# Patient Record
Sex: Male | Born: 1975 | Hispanic: Yes | Marital: Single | Smoking: Current every day smoker
Health system: Southern US, Community
[De-identification: ages and names within clinical notes are randomized; demographics above are authoritative.]

## PROBLEM LIST (undated history)

## (undated) DIAGNOSIS — M549 Dorsalgia, unspecified: Secondary | ICD-10-CM

## (undated) DIAGNOSIS — J45909 Unspecified asthma, uncomplicated: Secondary | ICD-10-CM

## (undated) DIAGNOSIS — G8929 Other chronic pain: Secondary | ICD-10-CM

---

## 2012-08-27 ENCOUNTER — Emergency Department: Payer: Self-pay | Admitting: Internal Medicine

## 2014-01-19 ENCOUNTER — Emergency Department: Payer: Self-pay | Admitting: Emergency Medicine

## 2014-01-31 ENCOUNTER — Emergency Department: Payer: Self-pay | Admitting: Internal Medicine

## 2014-02-02 ENCOUNTER — Emergency Department: Payer: Self-pay | Admitting: Emergency Medicine

## 2014-02-07 ENCOUNTER — Emergency Department: Payer: Self-pay | Admitting: Emergency Medicine

## 2014-11-01 ENCOUNTER — Emergency Department: Payer: Self-pay

## 2014-11-01 ENCOUNTER — Emergency Department
Admission: EM | Admit: 2014-11-01 | Discharge: 2014-11-01 | Disposition: A | Discharge status: Home / Self Care | Payer: Self-pay | Attending: Emergency Medicine | Admitting: Emergency Medicine

## 2014-11-01 ENCOUNTER — Encounter: Payer: Self-pay | Admitting: Emergency Medicine

## 2014-11-01 DIAGNOSIS — M5441 Lumbago with sciatica, right side: Secondary | ICD-10-CM | POA: Insufficient documentation

## 2014-11-01 DIAGNOSIS — M5136 Other intervertebral disc degeneration, lumbar region: Secondary | ICD-10-CM | POA: Insufficient documentation

## 2014-11-01 DIAGNOSIS — S39012A Strain of muscle, fascia and tendon of lower back, initial encounter: Secondary | ICD-10-CM | POA: Insufficient documentation

## 2014-11-01 DIAGNOSIS — Y939 Activity, unspecified: Secondary | ICD-10-CM | POA: Insufficient documentation

## 2014-11-01 DIAGNOSIS — Y999 Unspecified external cause status: Secondary | ICD-10-CM | POA: Insufficient documentation

## 2014-11-01 DIAGNOSIS — Z720 Tobacco use: Secondary | ICD-10-CM | POA: Insufficient documentation

## 2014-11-01 DIAGNOSIS — X58XXXA Exposure to other specified factors, initial encounter: Secondary | ICD-10-CM | POA: Insufficient documentation

## 2014-11-01 DIAGNOSIS — Y929 Unspecified place or not applicable: Secondary | ICD-10-CM | POA: Insufficient documentation

## 2014-11-01 HISTORY — DX: Dorsalgia, unspecified: M54.9

## 2014-11-01 HISTORY — DX: Other chronic pain: G89.29

## 2014-11-01 HISTORY — DX: Unspecified asthma, uncomplicated: J45.909

## 2014-11-01 MED ORDER — IBUPROFEN 800 MG PO TABS: 800.0000 mg | ORAL_TABLET | Freq: Three times a day (TID) | ORAL | Status: DC | PRN

## 2014-11-01 MED ORDER — TRAMADOL HCL 50 MG PO TABS: 100.0000 mg | ORAL_TABLET | Freq: Four times a day (QID) | ORAL | Status: DC | PRN

## 2014-11-01 MED ORDER — CYCLOBENZAPRINE HCL 10 MG PO TABS: 10.0000 mg | ORAL_TABLET | Freq: Three times a day (TID) | ORAL | Status: DC | PRN

## 2014-11-01 NOTE — ED Provider Notes (Signed)
[facility] Emergency Department Provider Note  ____________________________________________  Time seen: Approximately 9:08 PM  I have reviewed the triage vital signs and the nursing notes.   HISTORY  Chief Complaint Back Pain and Knee Pain    HPI Charles Benitez is a 39 y.o. male resents with a 3 to four-week history of low back pain. States that he usually takes tramadol with some significant relief. States pain is about a 4 now but to tramadol earlier tonight. Denies any trauma or injury just states he's had numbness and tingling down the legs with burning on the bottom of his feet.   Past Medical History  Diagnosis Date  . Chronic back pain   . Asthma     There are no active problems to display for this patient.   History reviewed. No pertinent past surgical history.  Current Outpatient Rx  Name  Route  Sig  Dispense  Refill  . cyclobenzaprine (FLEXERIL) 10 MG tablet   Oral   Take 1 tablet (10 mg total) by mouth every 8 (eight) hours as needed for muscle spasms.   30 tablet   1   . ibuprofen (ADVIL,MOTRIN) 800 MG tablet   Oral   Take 1 tablet (800 mg total) by mouth every 8 (eight) hours as needed.   30 tablet   0   . traMADol (ULTRAM) 50 MG tablet   Oral   Take 2 tablets (100 mg total) by mouth every 6 (six) hours as needed.   20 tablet   0     Allergies Shrimp  History reviewed. No pertinent family history.  Social History Social History  Substance Use Topics  . Smoking status: Current Every Day Smoker -- 0.50 packs/day    Types: Cigarettes  . Smokeless tobacco: None  . Alcohol Use: Yes    Review of Systems Constitutional: No fever/chills Eyes: No visual changes. ENT: No sore throat. Cardiovascular: Denies chest pain. Respiratory: Denies shortness of breath. Gastrointestinal: No abdominal pain.  No nausea, no vomiting.  No diarrhea.  No constipation. Genitourinary: Negative for dysuria. Musculoskeletal: Positive  for low back pain Skin: Negative for rash. Neurological: Negative for headaches, focal weakness, positive for tingling and burning to lower feet.  10-point ROS otherwise negative.  ____________________________________________   PHYSICAL EXAM:  VITAL SIGNS: ED Triage Vitals  Enc Vitals Group     BP 11/01/14 2036 139/66 mmHg     Pulse Rate 11/01/14 2036 54     Resp 11/01/14 2036 18     Temp 11/01/14 2036 98.5 F (36.9 C)     Temp Source 11/01/14 2036 Oral     SpO2 11/01/14 2036 98 %     Weight 11/01/14 2036 168 lb (76.204 kg)     Height 11/01/14 2036 5\' 6"  (1.676 m)     Head Cir --      Peak Flow --      Pain Score 11/01/14 2036 4     Pain Loc --      Pain Edu? --      Excl. in GC? --     Constitutional: Alert and oriented. Well appearing and in no acute distress. Neck: Full range of motion nontender   Cardiovascular: Normal rate, regular rhythm. Grossly normal heart sounds.  Good peripheral circulation. Respiratory: Normal respiratory effort.  No retractions. Lungs CTAB. Musculoskeletal: No lower extremity tenderness nor edema.  No joint effusions. As of lumbar sacral tenderness. Neurovascularly intact distally. Neurologic:  Normal speech and language. No  gross focal neurologic deficits are appreciated. No gait instability. Skin:  Skin is warm, dry and intact. No rash noted. Psychiatric: Mood and affect are normal. Speech and behavior are normal.  ____________________________________________   LABS (all labs ordered are listed, but only abnormal results are displayed)  Labs Reviewed - No data to display ____________________________________________  RADIOLOGY  Lumbar spine negative per radiologist reviewed by myself.Normal alignment. Mild T12-L1 degenerative disc disease. This is very mildly progressive when compared to prior study.  IMPRESSION: Degenerative changes no acute findings  ____________________________________________   PROCEDURES  Procedure(s)  performed: None  Critical Care performed: No  ____________________________________________   INITIAL IMPRESSION / ASSESSMENT AND PLAN / ED COURSE  Pertinent labs & imaging results that were available during my care of the patient were reviewed by me and considered in my medical decision making (see chart for details).  Chronic low back pain. Rx given for Motrin 800 mg 3 times a day continue tramadol as directed and start Flexeril 5 mg 3 times a day as needed for spasms. ____________________________________________   FINAL CLINICAL IMPRESSION(S) / ED DIAGNOSES  Final diagnoses:  Left-sided low back pain with right-sided sciatica  Lumbar strain, initial encounter  Degenerative disc disease, lumbar      Evangeline Dakin, PA-C 11/01/14 2201  Myrna Blazer, MD 11/02/14 [phone]

## 2014-11-01 NOTE — ED Notes (Signed)
Pt arrived to the ED for complaints of back pain and knee pain. Pt states that he has chronic back pain, takes tramadol for it and it some what works. Pt states that now he is experiencing knee pain and burning on his feet. Pt is AOx4 in no apparent distress.

## 2014-11-01 NOTE — Discharge Instructions (Signed)
Enfermedad Degenerativa del Disco  (Degenerative Disk Disease) La causa de la enfermedad degenerativa del disco son los cambios que se producen en las almohadillas de la columna vertebral (discos intervertebrales) a medida que avanza la edad. Los discos de la columna vertebral son discos blandos y compresibles localizados entre los huesos de la columna (vrtebras). Ellos actan como amortiguadores. La enfermedad degenerativa de disco puede afectar a toda la columna vertebral. Sin embargo, el cuello y la espalda baja son los ms afectados. Con el envejecimiento pueden ocurrir [employer] discos espinales, tales como:   Los discos de la columna vertebral pueden secarse y encogerse.  Puede haber pequeos desgarros en la membrana resistente que cubre el disco (anillo fibroso).  El espacio del disco puede volverse ms pequeo debido a la prdida de [place].  Puede ocurrir que haya crecimientos anormales en el hueso (espolones). Estos pueden presionar las races nerviosas que salen del canal espinal y Programmer, multimedia.  El canal espinal se Doctor, hospital. CAUSAS  La enfermedad degenerativa de disco es un trastorno causado por los cambios que ocurren en los discos espinales con el envejecimiento. No se conoce la causa exacta, pero no hay una base gentica en muchos pacientes. Los cambios degenerativos pueden ocurrir debido a la prdida de lquido en el disco. Esto hace que el disco sea ms delgado y reduce el espacio entre los huesos de la columna vertebral. Pueden aparecer pequeas grietas en la capa exterior del disco. Esto puede producir la ruptura del disco. Hay ms probabilidades de sufrir una enfermedad degenerativa del disco si tiene sobrepeso. Fumar cigarrillos y Education officer, environmental trabajos pesados, como el levantamiento de pesas, tambin puede aumentar el riesgo de sufrir esta enfermedad. Los cambios degenerativos pueden comenzar despus de una lesin repentina. El crecimiento de espolones seos puede  comprimir las races nerviosas y Programmer, multimedia.  SNTOMAS  Pueden variar de Neomia Dear persona a otra. Algunas personas pueden no sentir Scientist, research (medical), mientras que otras sienten dolor intenso. El dolor puede ser tan intenso que puede limitar sus [place]. La localizacin del dolor depende de la parte de la columna vertebral afectada. Sentir dolor en el cuello o en el brazo si hay un disco afectado en la zona del cuello. Sentir Radiographer, therapeutic espalda, las nalgas o las piernas si hay un disco afectado en la cintura. El dolor se agrava al doblarse, levantarse o con los movimientos de torsin. El dolor puede comenzar gradualmente y despus empeorar con el [place]. Tambin puede comenzar despus de una lesin mayor o menor. Puede sentir adormecimiento u hormigueo en los brazos o en las piernas.  DIAGNSTICO  El Barrister's clerk acerca de sus sntomas y de las actividades o hbitos que le causan Chief Technology Officer. Tambin podr preguntar acerca de lesiones, enfermedades o tratamientos anteriores. El mdico lo examinar para comprobar el rango de movimientos en la zona afectada, la fuerza en las extremidades y la sensibilidad en las reas de los brazos y las piernas a las que llegan las diferentes races nerviosas. Puede tomarle una radiografa de la columna vertebral. El mdico puede sugerir otras pruebas de diagnstico por imgenes, como resonancia magntica (MRI), si es necesario.  TRATAMIENTO  El tratamiento consiste en hacer reposo, modificar las actividades y la aplicacin de hielo y Company secretary. El mdico podr recetar medicamentos para Primary school teacher y pedirle que haga algunos ejercicios para fortalecer la espalda. En algunos casos podra necesitar [place]. Usted y el mdico decidirn qu tratamiento es el mejor para  usted.  INSTRUCCIONES PARA EL CUIDADO EN EL HOGAR   Siga las tcnicas apropiadas para levantar objetos y Advertising account planner, segn le indique su mdico.  Mantenga una buena [place].  Haga ejercicio con  regularidad segn lo aconsejado.  Haga ejercicios de relajacin.  Cambie sus hbitos para sentarse, estar de pie y los hbitos de sueo segn las indicaciones. Cambie de posicin con frecuencia.  Baje de peso segn lo aconsejado.  Si fuma, abandone el hbito.  Use calzado de apoyo. SOLICITE ATENCIN MDICA SI:  El dolor no desaparece en 1 a 4 semanas.  SOLICITE ATENCIN MDICA DE INMEDIATO SI:   El dolor es intenso.  Siente debilidad [employer], en las manos o en las piernas.  Comienza a perder el control de la vejiga o los movimientos intestinales. ASEGRESE DE QUE:   Comprende estas instrucciones.  Controlar su enfermedad.  Solicitar ayuda de inmediato si no mejora o si empeora. Document Released: 06/07/2008 Document Revised: 05/14/2011 [facility] Patient Information 2015 [place], [place]. This information is not intended to replace advice given to you by your health care provider. Make sure you discuss any questions you have with your health care provider.

## 2015-10-18 IMAGING — CR DG TIBIA/FIBULA 2V*L*
1 series · 2 of 2 positions shown · non-contrast
Comparison: None.

CLINICAL DATA: Laceration to the anterior lower leg by a piece of
[place]. Cellulitis.

EXAM:
LEFT TIBIA AND FIBULA - 2 VIEW

[Series 1: ap · 0.17mm/px · 2 of 2 slices shown]
[im 1/2]
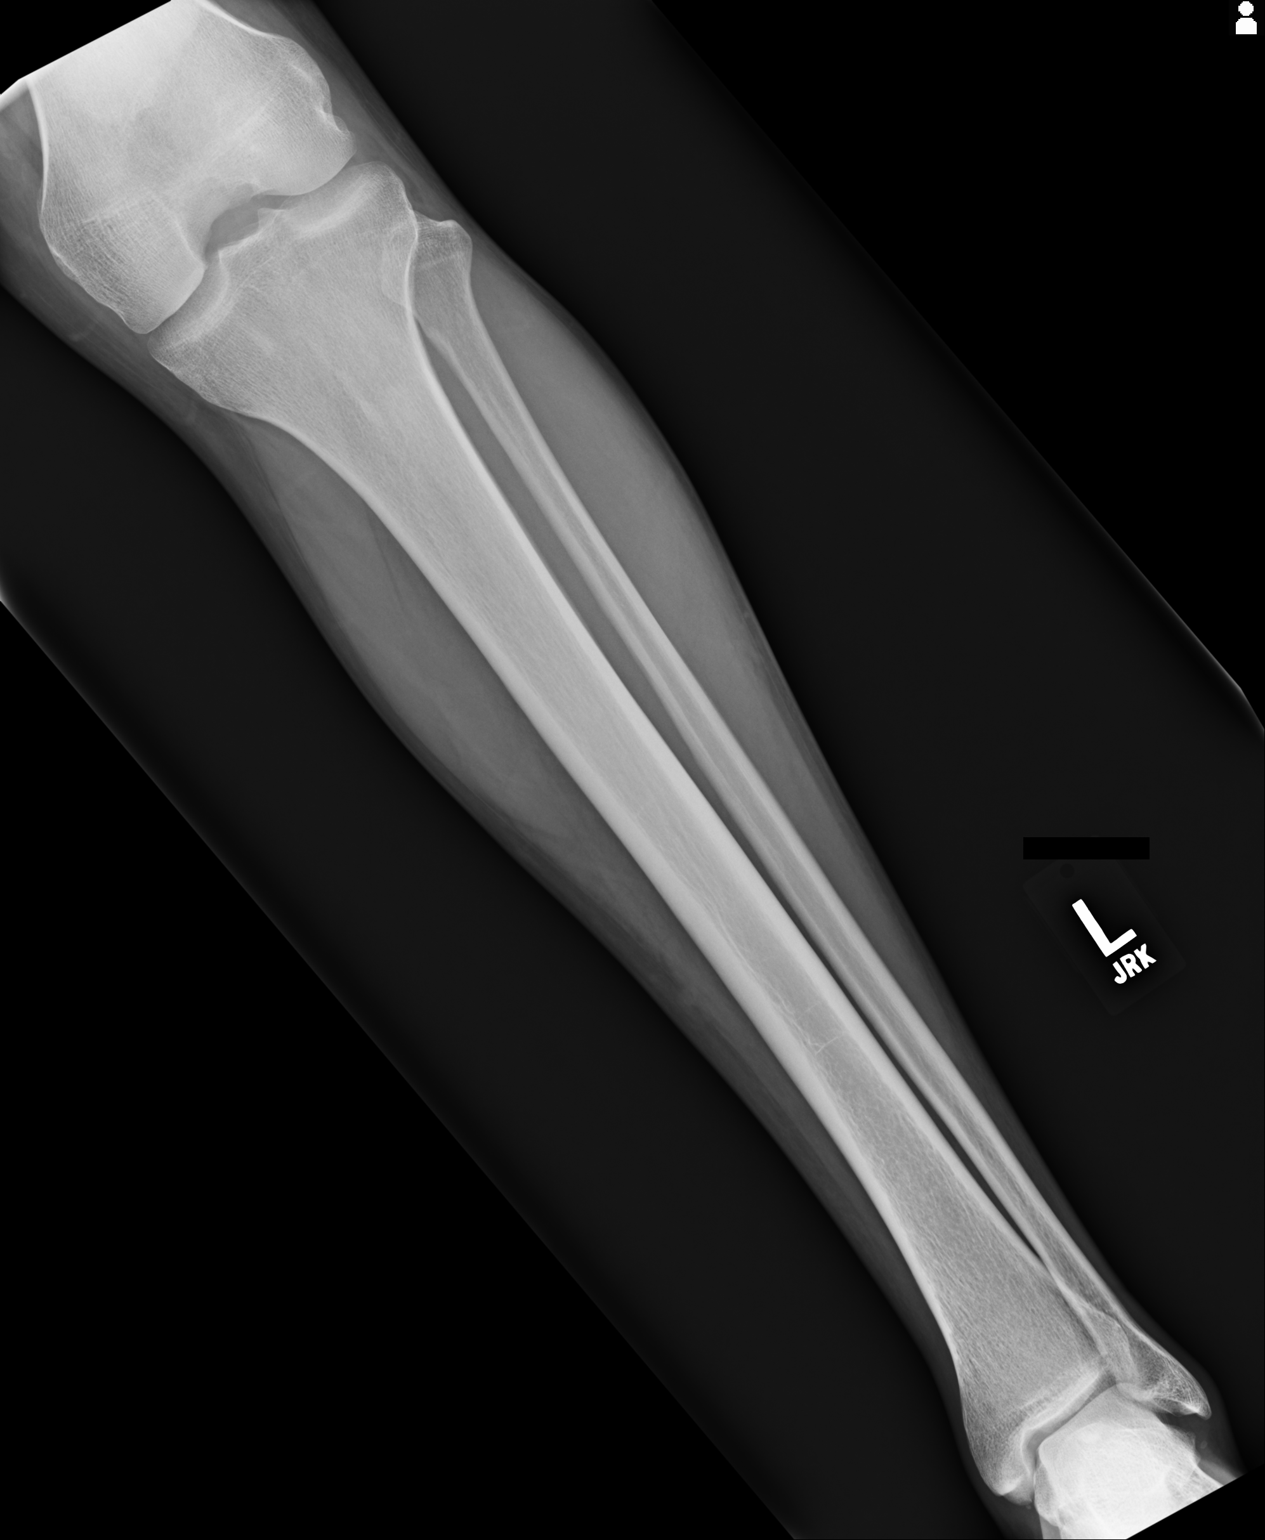
[im 2/2]
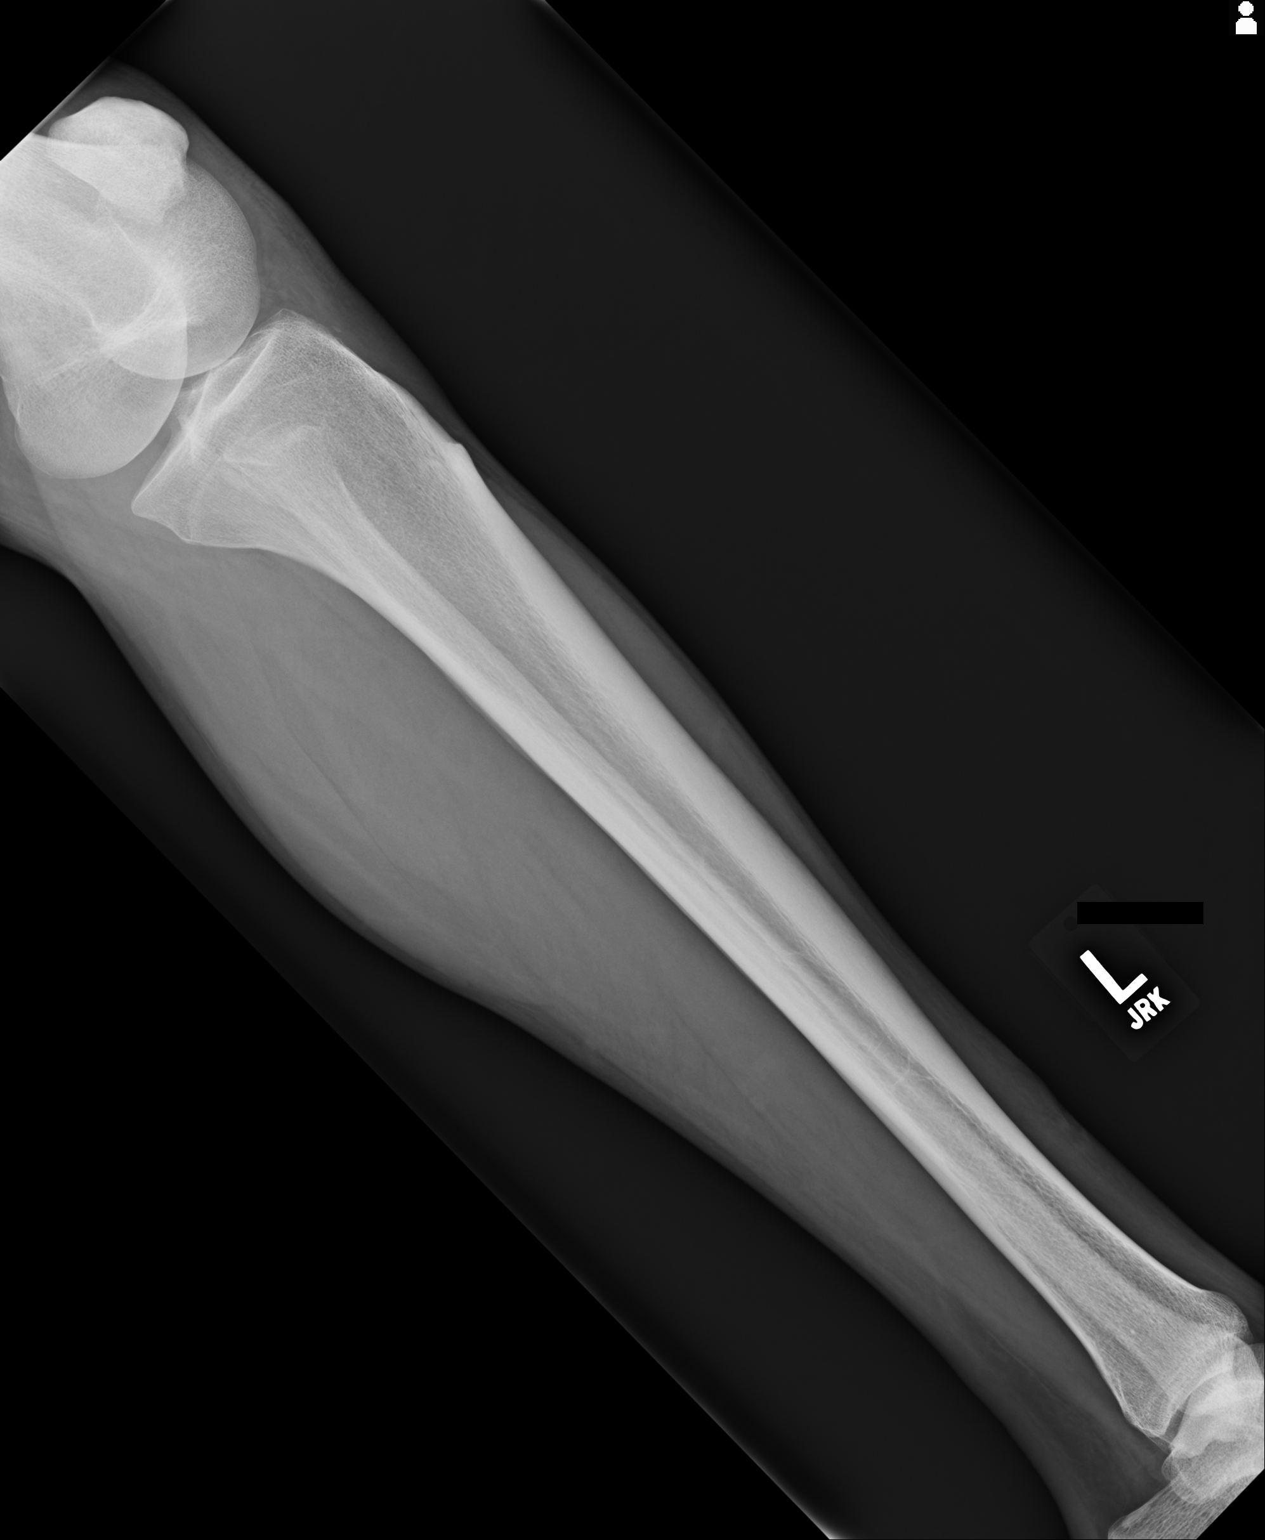

[2 of 2 positions shown; findings below may reference images not displayed]

FINDINGS: There is no fracture or other osseous abnormality. No radiodense
foreign body in the soft tissues. Slight soft tissue swelling at the
site of the injury.
IMPRESSION: Soft tissue swelling.  Otherwise, normal.

## 2015-10-26 ENCOUNTER — Emergency Department
Admission: EM | Admit: 2015-10-26 | Discharge: 2015-10-26 | Disposition: A | Discharge status: Home / Self Care | Payer: Self-pay | Attending: Emergency Medicine | Admitting: Emergency Medicine

## 2015-10-26 ENCOUNTER — Encounter: Payer: Self-pay | Admitting: Medical Oncology

## 2015-10-26 ENCOUNTER — Emergency Department: Payer: Self-pay

## 2015-10-26 DIAGNOSIS — X58XXXA Exposure to other specified factors, initial encounter: Secondary | ICD-10-CM | POA: Insufficient documentation

## 2015-10-26 DIAGNOSIS — Y999 Unspecified external cause status: Secondary | ICD-10-CM | POA: Insufficient documentation

## 2015-10-26 DIAGNOSIS — Y929 Unspecified place or not applicable: Secondary | ICD-10-CM | POA: Insufficient documentation

## 2015-10-26 DIAGNOSIS — M5136 Other intervertebral disc degeneration, lumbar region: Secondary | ICD-10-CM | POA: Insufficient documentation

## 2015-10-26 DIAGNOSIS — S29011A Strain of muscle and tendon of front wall of thorax, initial encounter: Secondary | ICD-10-CM | POA: Insufficient documentation

## 2015-10-26 DIAGNOSIS — S39012A Strain of muscle, fascia and tendon of lower back, initial encounter: Secondary | ICD-10-CM | POA: Insufficient documentation

## 2015-10-26 DIAGNOSIS — Y939 Activity, unspecified: Secondary | ICD-10-CM | POA: Insufficient documentation

## 2015-10-26 DIAGNOSIS — F17210 Nicotine dependence, cigarettes, uncomplicated: Secondary | ICD-10-CM | POA: Insufficient documentation

## 2015-10-26 DIAGNOSIS — S29019A Strain of muscle and tendon of unspecified wall of thorax, initial encounter: Secondary | ICD-10-CM

## 2015-10-26 DIAGNOSIS — J45909 Unspecified asthma, uncomplicated: Secondary | ICD-10-CM | POA: Insufficient documentation

## 2015-10-26 MED ORDER — METHOCARBAMOL 750 MG PO TABS: 750.0000 mg | ORAL_TABLET | Freq: Four times a day (QID) | ORAL | 0 refills | Status: DC

## 2015-10-26 MED ORDER — NAPROXEN 500 MG PO TABS: 500.0000 mg | ORAL_TABLET | Freq: Two times a day (BID) | ORAL | 0 refills | Status: DC

## 2015-10-26 NOTE — ED Triage Notes (Signed)
Pt reports that he has been having lower back pain and neck pain since Friday without injury.

## 2015-10-26 NOTE — ED Notes (Signed)
Pt in via triage with reports of lower back pain and bilateral shoulder pain since Friday.  Pt reports he has degenerative disc disease but the shoulder pain is new.  Pt reports taking some of his sisters tramadol and flexeril today but w/ no relief.  Pt ambulatory to room, A/Ox4, no immediate distress at this time.

## 2015-10-26 NOTE — ED Provider Notes (Signed)
[facility] Emergency Department Provider Note  ____________________________________________  Time seen: Approximately 12:27 PM  I have reviewed the triage vital signs and the nursing notes.   HISTORY  Chief Complaint Back Pain and Neck Pain    HPI Charles Benitez is a 40 y.o. male presents for evaluation of low back and neck pain, between the scapula, with no known injury 3 days. Patient reports he took his sister's tramadol and Flexeril with no relief. Denies any trauma.   Past Medical History:  Diagnosis Date  . Asthma   . Chronic back pain     There are no active problems to display for this patient.   History reviewed. No pertinent surgical history.  Prior to Admission medications   Medication Sig Start Date End Date Taking? Authorizing Provider  methocarbamol (ROBAXIN) 750 MG tablet Take 1 tablet (750 mg total) by mouth 4 (four) times daily. 10/26/15   Evangeline Dakinharles M Chaz Mcglasson, PA-C  naproxen (NAPROSYN) 500 MG tablet Take 1 tablet (500 mg total) by mouth 2 (two) times daily with a meal. 10/26/15   Evangeline Dakinharles M Trenna Kiely, PA-C    Allergies Shrimp [shellfish allergy]  No family history on file.  Social History Social History  Substance Use Topics  . Smoking status: Current Every Day Smoker    Packs/day: 0.50    Types: Cigarettes  . Smokeless tobacco: Not on file  . Alcohol use Yes    Review of Systems Constitutional: No fever/chills Cardiovascular: Denies chest pain. Respiratory: Denies shortness of breath. Genitourinary: Negative for dysuria. Musculoskeletal: Positive for low back and neck pain. As of her musculoskeletal pain.. Skin: Negative for rash. Neurological: Negative for headaches, focal weakness or numbness.  10-point ROS otherwise negative.  ____________________________________________   PHYSICAL EXAM:  VITAL SIGNS: ED Triage Vitals [10/26/15 1205]  Enc Vitals Group     BP (!) 141/95     Pulse Rate 95     Resp 17     Temp  98 F (36.7 C)     Temp Source Oral     SpO2 96 %     Weight 165 lb (74.8 kg)     Height 5\' 4"  (1.626 m)     Head Circumference      Peak Flow      Pain Score 7     Pain Loc      Pain Edu?      Excl. in GC?     Constitutional: Alert and oriented. Well appearing and in no acute distress. Neck: No stridor.Supple, full range of motion nontender.   Cardiovascular: Normal rate, regular rhythm. Grossly normal heart sounds.  Good peripheral circulation. Respiratory: Normal respiratory effort.  No retractions. Lungs CTAB. Gastrointestinal: Soft and nontender. No distention. No abdominal bruits. No CVA tenderness. Musculoskeletal: No lower extremity tenderness nor edema.  No joint effusions. Straight leg raise positive bilaterally for range of motion. Point tenderness noted between the scapulas within the muscular area. Neurologic:  Normal speech and language. No gross focal neurologic deficits are appreciated. No gait instability. Skin:  Skin is warm, dry and intact. No rash noted. Psychiatric: Mood and affect are normal. Speech and behavior are normal.  ____________________________________________   LABS (all labs ordered are listed, but only abnormal results are displayed)  Labs Reviewed - No data to display ____________________________________________  EKG   ____________________________________________  RADIOLOGY  No acute osseous findings. Degenerative disc disease noted. ____________________________________________   PROCEDURES  Procedure(s) performed: None  Critical Care performed: No  ____________________________________________  INITIAL IMPRESSION / ASSESSMENT AND PLAN / ED COURSE  Pertinent labs & imaging results that were available during my care of the patient were reviewed by me and considered in my medical decision making (see chart for details). Review of the [place] CSRS was performed in accordance of the [facility] prior to dispensing any controlled  drugs.  Nonspecific musculoskeletal pain with degenerative disc disease. Rx given for Robaxin 750 4 times a day and Naprosyn 500 mg twice a day. Patient follow-up with PCP or return to ER with worsening symptomology.  Clinical Course    ____________________________________________   FINAL CLINICAL IMPRESSION(S) / ED DIAGNOSES  Final diagnoses:  Degenerative disc disease, lumbar  Thoracic myofascial strain, initial encounter  Lumbar strain, initial encounter     This chart was dictated using voice recognition software/Dragon. Despite best efforts to proofread, errors can occur which can change the meaning. Any change was purely unintentional.    Evangeline Dakinharles M Huntington Leverich, PA-C 10/26/15 1401    Emily FilbertJonathan E Williams, MD 10/26/15 [phone]

## 2016-04-03 ENCOUNTER — Emergency Department
Admission: EM | Admit: 2016-04-03 | Discharge: 2016-04-03 | Disposition: A | Discharge status: Home / Self Care | Payer: No Typology Code available for payment source | Attending: Emergency Medicine | Admitting: Emergency Medicine

## 2016-04-03 ENCOUNTER — Encounter: Payer: Self-pay | Admitting: Emergency Medicine

## 2016-04-03 DIAGNOSIS — M545 Low back pain: Secondary | ICD-10-CM | POA: Diagnosis present

## 2016-04-03 DIAGNOSIS — F17210 Nicotine dependence, cigarettes, uncomplicated: Secondary | ICD-10-CM | POA: Insufficient documentation

## 2016-04-03 DIAGNOSIS — G8929 Other chronic pain: Secondary | ICD-10-CM | POA: Insufficient documentation

## 2016-04-03 DIAGNOSIS — J45909 Unspecified asthma, uncomplicated: Secondary | ICD-10-CM | POA: Diagnosis not present

## 2016-04-03 MED ORDER — NAPROXEN 500 MG PO TABS: 500.0000 mg | ORAL_TABLET | Freq: Two times a day (BID) | ORAL | 0 refills | Status: DC

## 2016-04-03 NOTE — Discharge Instructions (Signed)
You need to follow up with a doctor listed on this paper for your chronic back pain. [place] [facility] [place] and you need to find a doctor. There are several clinics listed on your discharge papers that are available to you. Begin taking naproxen 500 mg twice a day with food. You may also use ice or heat to your back as needed for comfort.

## 2016-04-03 NOTE — ED Triage Notes (Signed)
Patient presents to the ED with exacerbation of chronic back pain.  Patient states pain has been severe x 1 month.  Patient reports being diagnosed with degenerative disc disease and herniated discs in the ED previously.  Patient states, "I'm hoping you can give me a shot today."  Patient denies recent known injury but does report that he does heavy lifting at work.

## 2016-04-03 NOTE — ED Notes (Signed)
Called in the waiting room with no answer. °

## 2016-04-03 NOTE — ED Notes (Signed)
Pt reports lower back pain for one month + - pt states he has degenerative disc in lumbar spine - pt denies fall or injury

## 2016-04-03 NOTE — ED Provider Notes (Signed)
[facility] Emergency Department Provider Note  ____________________________________________   First MD Initiated Contact with Patient 04/03/16 1356     (approximate)  I have reviewed the triage vital signs and the nursing notes.   HISTORY  Chief Complaint Back Pain   HPI Charles Benitez is a 41 y.o. male is here with complaint of continued back pain.Patient states that he has chronic back pain and states that for the last month his pain has exacerbated. He states there is been no recent injury or any overuse that would make his back pain worse. He denies any bowel or bladder problems. He denies any history of kidney stones. He denies any nausea, vomiting or diarrhea. He states he was diagnosed in the emergency room with "degenerative disc disease and herniated disc". He states he has not been taking any over-the-counter medication and did not follow-up with any doctors as he believed that he did not have any active insurance. He states that he does lifting at work but does not report anything abnormal that this happened recently. He denies any incontinence of bowel or bladder. He continues to walk without assistance. Currently he rates his pain as 7/10.   Past Medical History:  Diagnosis Date  . Asthma   . Chronic back pain     There are no active problems to display for this patient.   History reviewed. No pertinent surgical history.  Prior to Admission medications   Medication Sig Start Date End Date Taking? Authorizing Provider  naproxen (NAPROSYN) 500 MG tablet Take 1 tablet (500 mg total) by mouth 2 (two) times daily with a meal. 04/03/16   Tommi Rumpshonda L Summers, PA-C    Allergies Shrimp [shellfish allergy]  No family history on file.  Social History Social History  Substance Use Topics  . Smoking status: Current Every Day Smoker    Packs/day: 0.50    Types: Cigarettes  . Smokeless tobacco: Never Used  . Alcohol use Yes    Review of  Systems Constitutional: No fever/chills Cardiovascular: Denies chest pain. Respiratory: Denies shortness of breath. Gastrointestinal: No abdominal pain.  No nausea, no vomiting.  Genitourinary: Negative for dysuria. Musculoskeletal: Positive for chronic back pain. Skin: Negative for rash. Neurological: Negative for headaches, focal weakness or numbness.  10-point ROS otherwise negative.  ____________________________________________   PHYSICAL EXAM:  VITAL SIGNS: ED Triage Vitals  Enc Vitals Group     BP 04/03/16 1231 (!) 115/58     Pulse Rate 04/03/16 1231 66     Resp 04/03/16 1231 18     Temp 04/03/16 1231 98.2 F (36.8 C)     Temp Source 04/03/16 1231 Oral     SpO2 04/03/16 1231 98 %     Weight 04/03/16 1232 162 lb (73.5 kg)     Height 04/03/16 1232 5\' 5"  (1.651 m)     Head Circumference --      Peak Flow --      Pain Score 04/03/16 1232 7     Pain Loc --      Pain Edu? --      Excl. in GC? --     Constitutional: Alert and oriented. Well appearing and in no acute distress. Eyes: Conjunctivae are normal. PERRL. EOMI. Head: Atraumatic. Nose: No congestion/rhinnorhea. Neck: No stridor.   Cardiovascular: Normal rate, regular rhythm. Grossly normal heart sounds.  Good peripheral circulation. Respiratory: Normal respiratory effort.  No retractions. Lungs CTAB. Gastrointestinal: Soft and nontender. No distention.  Musculoskeletal: Examination of the  back there is no gross deformity. There is tenderness on palpation of the lumbar spine and paravertebral muscles. No active muscle spasms were seen. Straight leg raises were negative. Reflexes were equal bilaterally. Good muscle strength. He did. Neurologic:  Normal speech and language. No gross focal neurologic deficits are appreciated. No gait instability. Skin:  Skin is warm, dry and intact. No rash noted. Psychiatric: Mood and affect are normal. Speech and behavior are  normal.  ____________________________________________   LABS (all labs ordered are listed, but only abnormal results are displayed)  Labs Reviewed - No data to display   RADIOLOGY  Right cervical and lumbar spine x-rays were reviewed. ____________________________________________   PROCEDURES  Procedure(s) performed: None  Procedures  Critical Care performed: No  ____________________________________________   INITIAL IMPRESSION / ASSESSMENT AND PLAN / ED COURSE  Pertinent labs & imaging results that were available during my care of the patient were reviewed by me and considered in my medical decision making (see chart for details).  Registration verified the patient's [place], health choice is active. Patient was given the orthopedist on call Dr. Rosita Kea to follow-up with. He was also given a prescription for naproxen 500 mg twice a day with food. Patient also was given a list of medical clinics to follow up with and establish as a PCP.   ____________________________________________   FINAL CLINICAL IMPRESSION(S) / ED DIAGNOSES  Final diagnoses:  Chronic midline low back pain without sciatica      NEW MEDICATIONS STARTED DURING THIS VISIT:  Discharge Medication List as of 04/03/2016  3:32 PM       Note:  This document was prepared using Dragon voice recognition software and may include unintentional dictation errors.    Tommi Rumps, PA-C 04/03/16 1759    Jene Every, MD 04/09/16 [phone]

## 2016-12-27 ENCOUNTER — Encounter: Payer: Self-pay | Admitting: Emergency Medicine

## 2016-12-27 ENCOUNTER — Emergency Department
Admission: EM | Admit: 2016-12-27 | Discharge: 2016-12-27 | Disposition: A | Discharge status: Home / Self Care | Payer: Self-pay | Attending: Emergency Medicine | Admitting: Emergency Medicine

## 2016-12-27 DIAGNOSIS — M545 Low back pain, unspecified: Secondary | ICD-10-CM

## 2016-12-27 DIAGNOSIS — J45909 Unspecified asthma, uncomplicated: Secondary | ICD-10-CM | POA: Insufficient documentation

## 2016-12-27 DIAGNOSIS — F17210 Nicotine dependence, cigarettes, uncomplicated: Secondary | ICD-10-CM | POA: Insufficient documentation

## 2016-12-27 MED ORDER — NAPROXEN 500 MG PO TABS: 500.0000 mg | ORAL_TABLET | Freq: Two times a day (BID) | ORAL | 0 refills | Status: DC

## 2016-12-27 MED ORDER — METHOCARBAMOL 500 MG PO TABS
1000.0000 mg | ORAL_TABLET | Freq: Once | ORAL | Status: AC
  Administered 2016-12-27: 1000 mg via ORAL
  Filled 2016-12-27: qty 2

## 2016-12-27 MED ORDER — METHOCARBAMOL 500 MG PO TABS: ORAL_TABLET | ORAL | 0 refills | Status: DC

## 2016-12-27 MED ORDER — HYDROCODONE-ACETAMINOPHEN 5-325 MG PO TABS: 1.0000 | ORAL_TABLET | Freq: Four times a day (QID) | ORAL | 0 refills | Status: DC | PRN

## 2016-12-27 MED ORDER — HYDROCODONE-ACETAMINOPHEN 5-325 MG PO TABS
1.0000 | ORAL_TABLET | Freq: Once | ORAL | Status: AC
  Administered 2016-12-27: 1 via ORAL
  Filled 2016-12-27: qty 1

## 2016-12-27 MED ORDER — KETOROLAC TROMETHAMINE 30 MG/ML IJ SOLN
30.0000 mg | Freq: Once | INTRAMUSCULAR | Status: AC
  Administered 2016-12-27: 30 mg via INTRAMUSCULAR
  Filled 2016-12-27: qty 1

## 2016-12-27 NOTE — ED Provider Notes (Signed)
[facility] Emergency Department Provider Note   ____________________________________________   First MD Initiated Contact with Patient 12/27/16 1248     (approximate)  I have reviewed the triage vital signs and the nursing notes.   HISTORY  Chief Complaint Back Pain  HPI Charles Benitez is a 41 y.o. male chief complaint of low back pain for the last 2 days. Patient states he has a history of back painhas been seen in the ED in the past. He has not taken any over-the-counter medication other than Aleve in the last 2 days which has not helped. He denies any recent injury to his back. He denies any paresthesias, saddle anesthesias or incontinence of bowel or bladder. Patient continues to ambulate without assistance.currently he rates his pain as a 10 over 10.   Past Medical History:  Diagnosis Date  . Asthma   . Chronic back pain     There are no active problems to display for this patient.   History reviewed. No pertinent surgical history.  Prior to Admission medications   Medication Sig Start Date End Date Taking? Authorizing Provider  HYDROcodone-acetaminophen (NORCO/VICODIN) 5-325 MG tablet Take 1 tablet by mouth every 6 (six) hours as needed for moderate pain. 12/27/16   Tommi Rumps, PA-C  methocarbamol (ROBAXIN) 500 MG tablet 1-2 tablets every 6 hours prn muscle spasms 12/27/16   Bridget Hartshorn L, PA-C  naproxen (NAPROSYN) 500 MG tablet Take 1 tablet (500 mg total) by mouth 2 (two) times daily with a meal. 12/27/16   Tommi Rumps, PA-C    Allergies Shrimp [shellfish allergy]  No family history on file.  Social History Social History  Substance Use Topics  . Smoking status: Current Every Day Smoker    Packs/day: 0.30    Types: Cigarettes  . Smokeless tobacco: Never Used  . Alcohol use Yes    Review of Systems Constitutional: No fever/chills Cardiovascular: Denies chest pain. Respiratory: Denies shortness of  breath. Gastrointestinal: No abdominal pain.  No nausea, no vomiting.   Genitourinary: Negative for dysuria. Musculoskeletal: positive for low back pain. Skin: Negative for rash. Neurological: Negative for headaches, focal weakness or numbness. ____________________________________________   PHYSICAL EXAM:  VITAL SIGNS: ED Triage Vitals  Enc Vitals Group     BP 12/27/16 1205 128/64     Pulse Rate 12/27/16 1205 70     Resp 12/27/16 1205 18     Temp 12/27/16 1205 98.6 F (37 C)     Temp Source 12/27/16 1205 Oral     SpO2 12/27/16 1205 97 %     Weight 12/27/16 1206 165 lb (74.8 kg)     Height 12/27/16 1206 5\' 5"  (1.651 m)     Head Circumference --      Peak Flow --      Pain Score 12/27/16 1204 10     Pain Loc --      Pain Edu? --      Excl. in GC? --    Constitutional: Alert and oriented. Well appearing and in no acute distress. Eyes: Conjunctivae are normal.  Head: Atraumatic. Nose: No congestion/rhinnorhea. Neck: No stridor.   Cardiovascular: Normal rate, regular rhythm. Grossly normal heart sounds.  Good peripheral circulation. Respiratory: Normal respiratory effort.  No retractions. Lungs CTAB. Gastrointestinal: Soft and nontender. No distention.  No CVA tenderness. Musculoskeletal: examination of the back there is no gross deformity there is however moderate tenderness on palpation bilateral lumbar spine and paravertebral muscles. Range of motion is  restricted secondary to muscle spasms. Unable to evaluate straight leg raises secondary to muscle spasms. Neurologic:  Normal speech and language. No gross focal neurologic deficits are appreciated. Reflexes 1+ bilaterally. Skin:  Skin is warm, dry and intact. No rash, erythema, ecchymosis or abrasions seen. Psychiatric: Mood and affect are normal. Speech and behavior are normal.  ____________________________________________   LABS (all labs ordered are listed, but only abnormal results are displayed)  Labs Reviewed - No  data to display  RADIOLOGY  Deferred. ____________________________________________   PROCEDURES  Procedure(s) performed: None  Procedures  Critical Care performed: No  ____________________________________________   INITIAL IMPRESSION / ASSESSMENT AND PLAN / ED COURSE  As part of my medical decision making, I reviewed the following data within the electronic MEDICAL RECORD NUMBER Notes from prior ED visits and [place] Controlled Substance Database  patient improved after being given Toradol 30 g IM, Norco and Robaxin. Patient was discharged when girlfriend came to pick him up. He was discharged with a prescription for naproxen 500 mg twice a day with food, Robaxin one or 2 tablets every 6 hours as needed for muscle spasms and Norco one every 6 hours as needed for pain #8. Patient is encouraged to use ice or heat to his back as needed for back pain. He is also to follow-up with a PCP or one of many clinics that were listed on his discharge papers.   ___________________________________________   FINAL CLINICAL IMPRESSION(S) / ED DIAGNOSES  Final diagnoses:  Acute bilateral low back pain without sciatica      NEW MEDICATIONS STARTED DURING THIS VISIT:  New Prescriptions   HYDROCODONE-ACETAMINOPHEN (NORCO/VICODIN) 5-325 MG TABLET    Take 1 tablet by mouth every 6 (six) hours as needed for moderate pain.   METHOCARBAMOL (ROBAXIN) 500 MG TABLET    1-2 tablets every 6 hours prn muscle spasms   NAPROXEN (NAPROSYN) 500 MG TABLET    Take 1 tablet (500 mg total) by mouth 2 (two) times daily with a meal.     Note:  This document was prepared using Dragon voice recognition software and may include unintentional dictation errors.    Tommi RumpsSummers, Rhonda L, PA-C 12/27/16 1432    Jeanmarie PlantMcShane, James A, MD 12/27/16 [phone]

## 2016-12-27 NOTE — Discharge Instructions (Signed)
[facility] any continued problems with your [facility]. He should establish a primary care provider for further evaluation also. You may call [facility], [facility], [facility], [facility] and [facility] which are listed on your discharge papers.

## 2016-12-27 NOTE — ED Notes (Signed)
NAD noted at time of D/C. Pt denies questions or concerns. Pt ambulatory to the lobby at this time.  

## 2016-12-27 NOTE — ED Triage Notes (Signed)
Patient presents to the ED with severe lower back pain x 2 days.  Patient reports history of chronic back pain.  Patient denies injury.  Patient states, "this has happened before, I don't have to do anything to my back, it will just start to hurt really bad."

## 2016-12-27 NOTE — ED Notes (Addendum)
Reports lower back pain started three days ago from unknown cause. Patient awakened by pain. Patient reports he has had this pain for years

## 2017-06-05 ENCOUNTER — Encounter: Payer: Self-pay | Admitting: Emergency Medicine

## 2017-06-05 ENCOUNTER — Emergency Department
Admission: EM | Admit: 2017-06-05 | Discharge: 2017-06-05 | Disposition: A | Discharge status: Home / Self Care | Payer: Self-pay | Attending: Emergency Medicine | Admitting: Emergency Medicine

## 2017-06-05 ENCOUNTER — Other Ambulatory Visit: Payer: Self-pay

## 2017-06-05 DIAGNOSIS — R112 Nausea with vomiting, unspecified: Secondary | ICD-10-CM

## 2017-06-05 DIAGNOSIS — J45909 Unspecified asthma, uncomplicated: Secondary | ICD-10-CM | POA: Insufficient documentation

## 2017-06-05 DIAGNOSIS — R197 Diarrhea, unspecified: Secondary | ICD-10-CM

## 2017-06-05 DIAGNOSIS — F17210 Nicotine dependence, cigarettes, uncomplicated: Secondary | ICD-10-CM | POA: Insufficient documentation

## 2017-06-05 DIAGNOSIS — E860 Dehydration: Secondary | ICD-10-CM | POA: Insufficient documentation

## 2017-06-05 LAB — LIPASE, BLOOD: LIPASE: 24 U/L (ref 11–51)

## 2017-06-05 LAB — CBC WITH DIFFERENTIAL/PLATELET
Basophils Absolute: 0 10*3/uL (ref 0–0.1)
Basophils Relative: 0 %
EOS PCT: 3 %
Eosinophils Absolute: 0.2 10*3/uL (ref 0–0.7)
HCT: 47 % (ref 40.0–52.0)
Hemoglobin: 15.6 g/dL (ref 13.0–18.0)
LYMPHS ABS: 1 10*3/uL (ref 1.0–3.6)
Lymphocytes Relative: 17 %
MCH: 31 pg (ref 26.0–34.0)
MCHC: 33.3 g/dL (ref 32.0–36.0)
MCV: 93.2 fL (ref 80.0–100.0)
MONO ABS: 0.7 10*3/uL (ref 0.2–1.0)
Monocytes Relative: 11 %
Neutro Abs: 4.2 10*3/uL (ref 1.4–6.5)
Neutrophils Relative %: 69 %
PLATELETS: 256 10*3/uL (ref 150–440)
RBC: 5.05 MIL/uL (ref 4.40–5.90)
RDW: 13.8 % (ref 11.5–14.5)
WBC: 6.1 10*3/uL (ref 3.8–10.6)

## 2017-06-05 LAB — COMPREHENSIVE METABOLIC PANEL
ALK PHOS: 101 U/L (ref 38–126)
ALT: 31 U/L (ref 17–63)
AST: 27 U/L (ref 15–41)
Albumin: 3.9 g/dL (ref 3.5–5.0)
Anion gap: 4 — ABNORMAL LOW (ref 5–15)
BUN: 11 mg/dL (ref 6–20)
CALCIUM: 8.9 mg/dL (ref 8.9–10.3)
CHLORIDE: 108 mmol/L (ref 101–111)
CO2: 27 mmol/L (ref 22–32)
CREATININE: 0.78 mg/dL (ref 0.61–1.24)
GFR calc Af Amer: >60 mL/min (ref >60)
Glucose, Bld: 94 mg/dL (ref 65–99)
Potassium: 4.1 mmol/L (ref 3.5–5.1)
Sodium: 139 mmol/L (ref 135–145)
Total Bilirubin: 0.4 mg/dL (ref 0.3–1.2)
Total Protein: 7.3 g/dL (ref 6.5–8.1)

## 2017-06-05 MED ORDER — SODIUM CHLORIDE 0.9 % IV BOLUS
1000.0000 mL | Freq: Once | INTRAVENOUS | Status: AC
  Administered 2017-06-05: 1000 mL via INTRAVENOUS

## 2017-06-05 MED ORDER — ONDANSETRON HCL 4 MG PO TABS: 4.0000 mg | ORAL_TABLET | Freq: Every day | ORAL | 0 refills | Status: AC | PRN

## 2017-06-05 MED ORDER — ONDANSETRON HCL 4 MG/2ML IJ SOLN
4.0000 mg | Freq: Once | INTRAMUSCULAR | Status: AC
  Administered 2017-06-05: 4 mg via INTRAVENOUS
  Filled 2017-06-05: qty 2

## 2017-06-05 NOTE — Discharge Instructions (Signed)
Please make sure you remain well-hydrated and follow-up with primary care for reevaluation.  Return to the emergency department sooner for any concerns.  It was a pleasure to take care of you today, and thank you for coming to our emergency department.  If you have any questions or concerns before leaving please ask the nurse to grab me and I'm more than happy to go through your aftercare instructions again.  If you were prescribed any opioid pain medication today such as Norco, Vicodin, Percocet, morphine, hydrocodone, or oxycodone please make sure you do not drive when you are taking this medication as it can alter your ability to drive safely.  If you have any concerns once you are home that you are not improving or are in fact getting worse before you can make it to your follow-up appointment, please do not hesitate to call 911 and come back for further evaluation.  Merrily BrittleNeil Alyssah Algeo, MD  Results for orders placed or performed during the hospital encounter of 06/05/17  Comprehensive metabolic panel  Result Value Ref Range   Sodium 139 135 - 145 mmol/L   Potassium 4.1 3.5 - 5.1 mmol/L   Chloride 108 101 - 111 mmol/L   CO2 27 22 - 32 mmol/L   Glucose, Bld 94 65 - 99 mg/dL   BUN 11 6 - 20 mg/dL   Creatinine, Ser 1.610.78 0.61 - 1.24 mg/dL   Calcium 8.9 8.9 - 09.610.3 mg/dL   Total Protein 7.3 6.5 - 8.1 g/dL   Albumin 3.9 3.5 - 5.0 g/dL   AST 27 15 - 41 U/L   ALT 31 17 - 63 U/L   Alkaline Phosphatase 101 38 - 126 U/L   Total Bilirubin 0.4 0.3 - 1.2 mg/dL   GFR calc non Af Amer >60 >60 mL/min   GFR calc Af Amer >60 >60 mL/min   Anion gap 4 (L) 5 - 15  Lipase, blood  Result Value Ref Range   Lipase 24 11 - 51 U/L  CBC with Differential  Result Value Ref Range   WBC 6.1 3.8 - 10.6 K/uL   RBC 5.05 4.40 - 5.90 MIL/uL   Hemoglobin 15.6 13.0 - 18.0 g/dL   HCT 04.547.0 40.940.0 - 81.152.0 %   MCV 93.2 80.0 - 100.0 fL   MCH 31.0 26.0 - 34.0 pg   MCHC 33.3 32.0 - 36.0 g/dL   RDW 91.413.8 78.211.5 - 95.614.5 %   Platelets 256 150 - 440 K/uL   Neutrophils Relative % 69 %   Neutro Abs 4.2 1.4 - 6.5 K/uL   Lymphocytes Relative 17 %   Lymphs Abs 1.0 1.0 - 3.6 K/uL   Monocytes Relative 11 %   Monocytes Absolute 0.7 0.2 - 1.0 K/uL   Eosinophils Relative 3 %   Eosinophils Absolute 0.2 0 - 0.7 K/uL   Basophils Relative 0 %   Basophils Absolute 0.0 0 - 0.1 K/uL

## 2017-06-05 NOTE — ED Triage Notes (Signed)
C/O abdominal pain, diarrhea and vomiting x 1 day.

## 2017-06-05 NOTE — ED Provider Notes (Signed)
[facility] Emergency Department Provider Note  ____________________________________________   First MD Initiated Contact with Patient 06/05/17 [phone]     (approximate)  I have reviewed the triage vital signs and the nursing notes.   HISTORY  Chief Complaint Abdominal Pain; Diarrhea; and Emesis   HPI Charles Benitez is a 42 y.o. male who self presents the emergency department with cramping abdominal pain nausea vomiting and diarrhea for the past day.  His symptoms began shortly after eating dinner at a [employer] restaurant.  His abdominal pain is diffuse abdominal mild to moderate severity cramping lower abdominal worse with defecating improved thereafter.  He feels like his symptoms are slowly improving.  He denies fevers or chills.  He has no history of abdominal surgeries.  No sick contacts.  No fevers or chills.  He is able to eat and drink.  Past Medical History:  Diagnosis Date  . Asthma   . Chronic back pain     There are no active problems to display for this patient.   History reviewed. No pertinent surgical history.  Prior to Admission medications   Medication Sig Start Date End Date Taking? Authorizing Provider  HYDROcodone-acetaminophen (NORCO/VICODIN) 5-325 MG tablet Take 1 tablet by mouth every 6 (six) hours as needed for moderate pain. Patient not taking: Reported on 06/05/2017 12/27/16   Tommi RumpsSummers, Rhonda L, PA-C  methocarbamol (ROBAXIN) 500 MG tablet 1-2 tablets every 6 hours prn muscle spasms Patient not taking: Reported on 06/05/2017 12/27/16   Tommi RumpsSummers, Rhonda L, PA-C  naproxen (NAPROSYN) 500 MG tablet Take 1 tablet (500 mg total) by mouth 2 (two) times daily with a meal. Patient not taking: Reported on 06/05/2017 12/27/16   Tommi RumpsSummers, Rhonda L, PA-C  ondansetron (ZOFRAN) 4 MG tablet Take 1 tablet (4 mg total) by mouth daily as needed. 06/05/17 06/05/18  Merrily Brittleifenbark, Sutter Ahlgren, MD    Allergies Shrimp [shellfish allergy]  No family history on  file.  Social History Social History   Tobacco Use  . Smoking status: Current Every Day Smoker    Packs/day: 0.30    Types: Cigarettes  . Smokeless tobacco: Never Used  Substance Use Topics  . Alcohol use: Yes  . Drug use: Yes    Types: Marijuana    Review of Systems Constitutional: No fever/chills Eyes: No visual changes. ENT: No sore throat. Cardiovascular: Denies chest pain. Respiratory: Denies shortness of breath. Gastrointestinal: Positive for abdominal pain.  Positive for nausea, positive for vomiting.  Positive for diarrhea.  No constipation. Genitourinary: Negative for dysuria. Musculoskeletal: Negative for back pain. Skin: Negative for rash. Neurological: Negative for headaches, focal weakness or numbness.   ____________________________________________   PHYSICAL EXAM:  VITAL SIGNS: ED Triage Vitals  Enc Vitals Group     BP 06/05/17 0925 115/71     Pulse Rate 06/05/17 0925 84     Resp 06/05/17 0925 16     Temp 06/05/17 0925 97.6 F (36.4 C)     Temp Source 06/05/17 0925 Oral     SpO2 06/05/17 0925 100 %     Weight 06/05/17 0924 178 lb (80.7 kg)     Height 06/05/17 0924 5\' 6"  (1.676 m)     Head Circumference --      Peak Flow --      Pain Score 06/05/17 0924 7     Pain Loc --      Pain Edu? --      Excl. in GC? --     Constitutional: Alert and  oriented x4 joking laughing well-appearing nontoxic no diaphoresis speaks full clear sentences Nose: No congestion/rhinnorhea. Mouth/Throat: No trismus Neck: No stridor.   Cardiovascular: Normal rate, regular rhythm. Grossly normal heart sounds.  Good peripheral circulation. Respiratory: Normal respiratory effort.  No retractions. Lungs CTAB and moving good air Gastrointestinal: Soft mild diffuse upper tenderness with no rebound or guarding no peritonitis no focality Musculoskeletal: No lower extremity edema   Neurologic:  Normal speech and language. No gross focal neurologic deficits are appreciated. Skin:   Skin is warm, dry and intact. No rash noted. Psychiatric: Mood and affect are normal. Speech and behavior are normal.    ____________________________________________   DIFFERENTIAL includes but not limited to  Food poisoning, bacterial gastroenteritis, viral gastroenteritis, appendicitis, pancreatitis ____________________________________________   LABS (all labs ordered are listed, but only abnormal results are displayed)  Labs Reviewed  COMPREHENSIVE METABOLIC PANEL - Abnormal; Notable for the following components:      Result Value   Anion gap 4 (*)    All other components within normal limits  LIPASE, BLOOD  CBC WITH DIFFERENTIAL/PLATELET    Lab work reviewed by me with no acute disease __________________________________________  EKG   ____________________________________________  RADIOLOGY   ____________________________________________   PROCEDURES  Procedure(s) performed: no  Procedures  Critical Care performed: no  Observation: no ____________________________________________   INITIAL IMPRESSION / ASSESSMENT AND PLAN / ED COURSE  Pertinent labs & imaging results that were available during my care of the patient were reviewed by me and considered in my medical decision making (see chart for details).  The patient arrives hemodynamically stable and relatively well-appearing with rapidly improving nausea vomiting diarrhea after eating at a restaurant.  Lab work is pending particularly concerning for pancreatitis and dehydration.    ----------------------------------------- 12:18 PM on 06/05/2017 -----------------------------------------  The patient symptoms are improved.  His abdomen is benign and he is able to eat and drink.  Will discharge home with a short course of Zofran and strict return precautions.  The patient verbalizes understanding and agreement the plan.  ____________________________________________   FINAL CLINICAL IMPRESSION(S) /  ED DIAGNOSES  Final diagnoses:  Nausea vomiting and diarrhea  Dehydration      NEW MEDICATIONS STARTED DURING THIS VISIT:  Discharge Medication List as of 06/05/2017 12:17 PM    START taking these medications   Details  ondansetron (ZOFRAN) 4 MG tablet Take 1 tablet (4 mg total) by mouth daily as needed., Starting Wed 06/05/2017, Until Thu 06/05/2018, Print         Note:  This document was prepared using Dragon voice recognition software and may include unintentional dictation errors.     Merrily Brittle, MD 06/07/17 [phone]

## 2017-07-12 IMAGING — CR DG CERVICAL SPINE 2 OR 3 VIEWS
1 series · 3 of 3 positions shown · non-contrast
Comparison: None.

CLINICAL DATA: Neck pain radiating into both shoulders.

EXAM:
CERVICAL SPINE - 2-3 VIEW

[Series 1: dg cervical spine 2 or 3 views · 0.14mm/px · 3 of 3 slices shown]
[im 1/3]
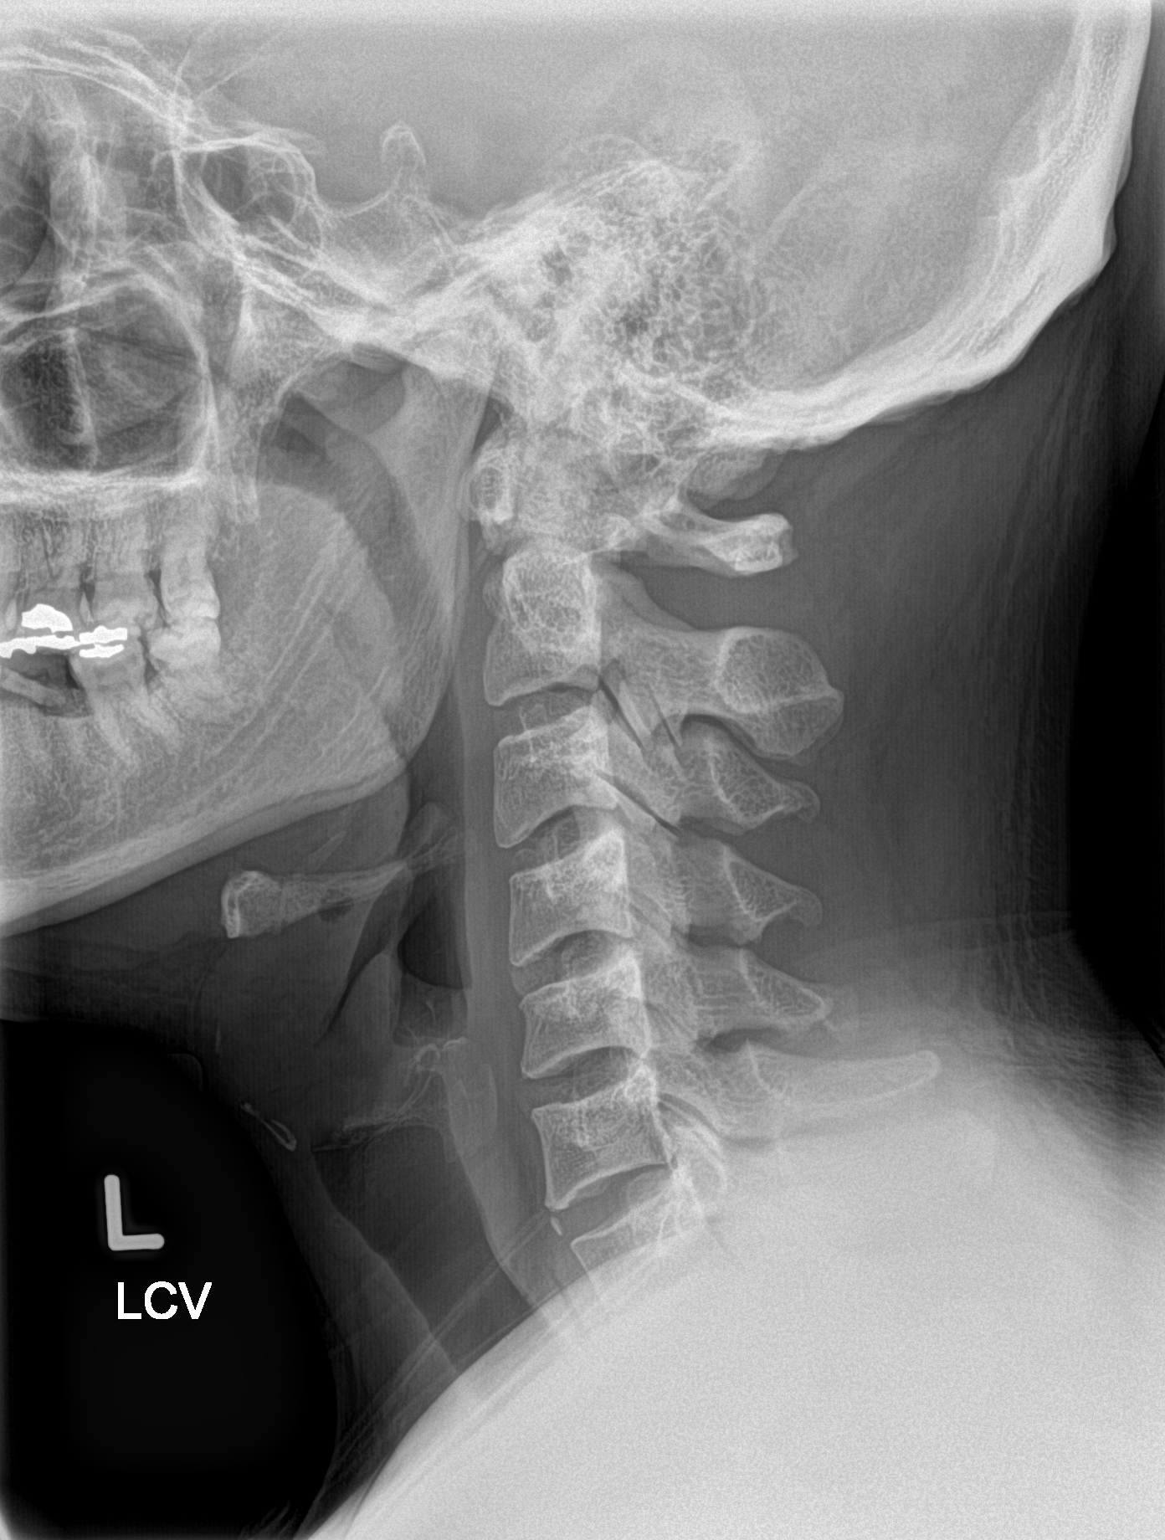
[im 2/3]
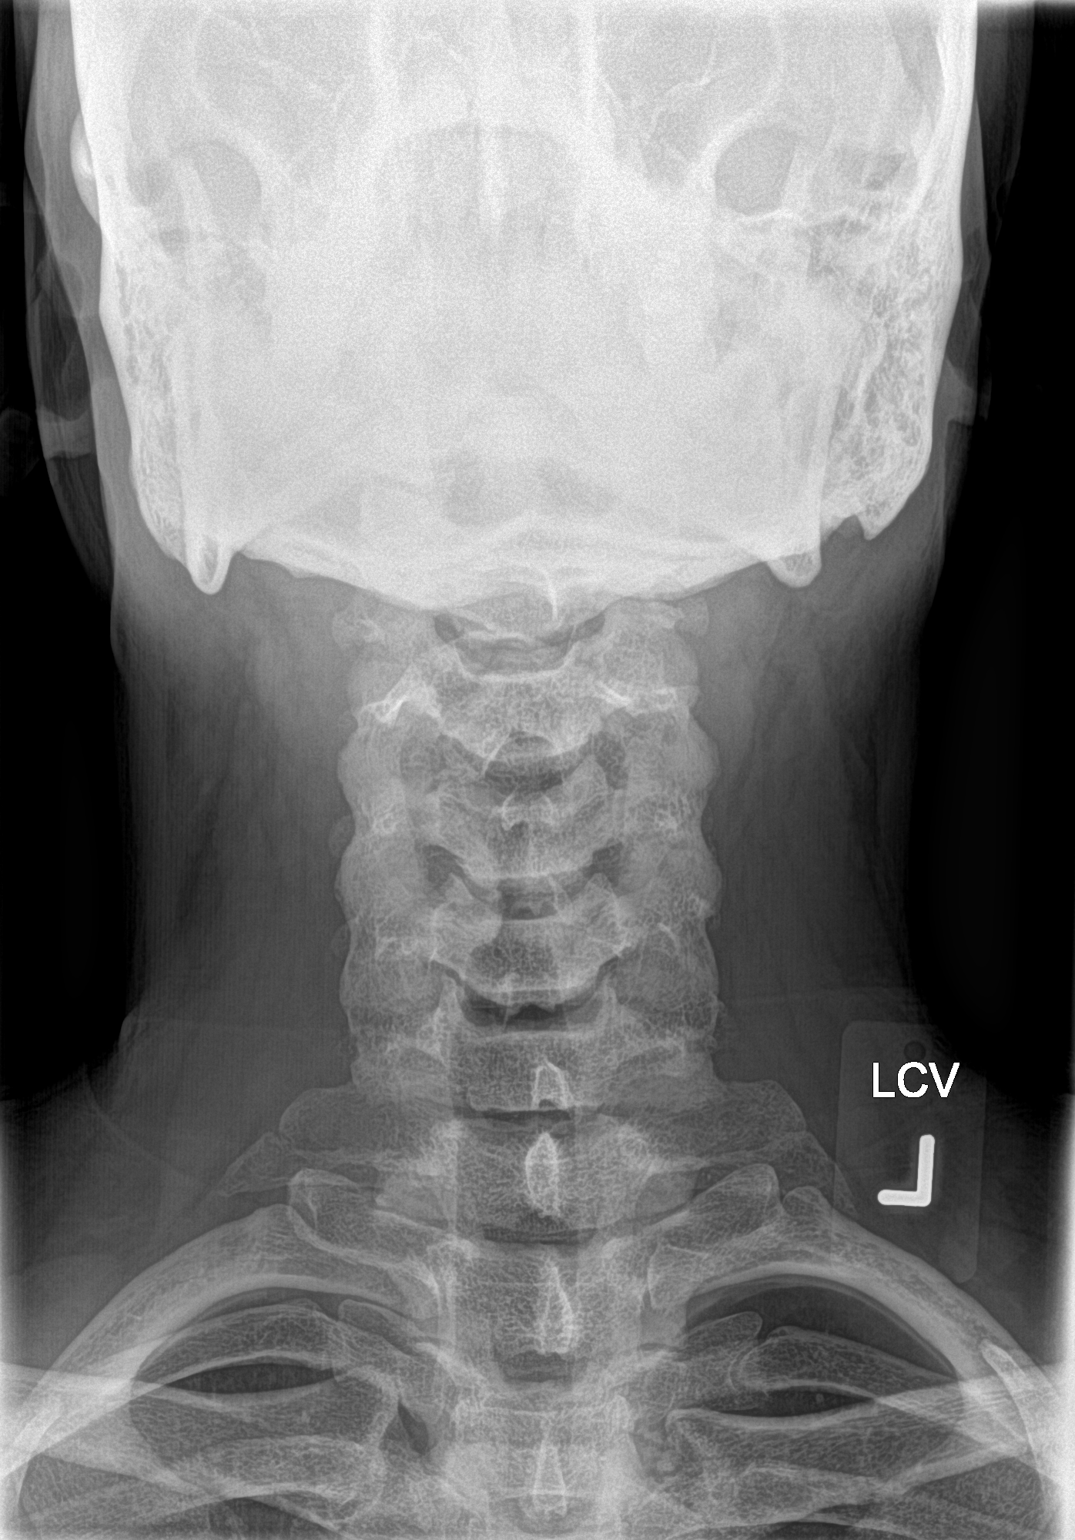
[im 3/3]
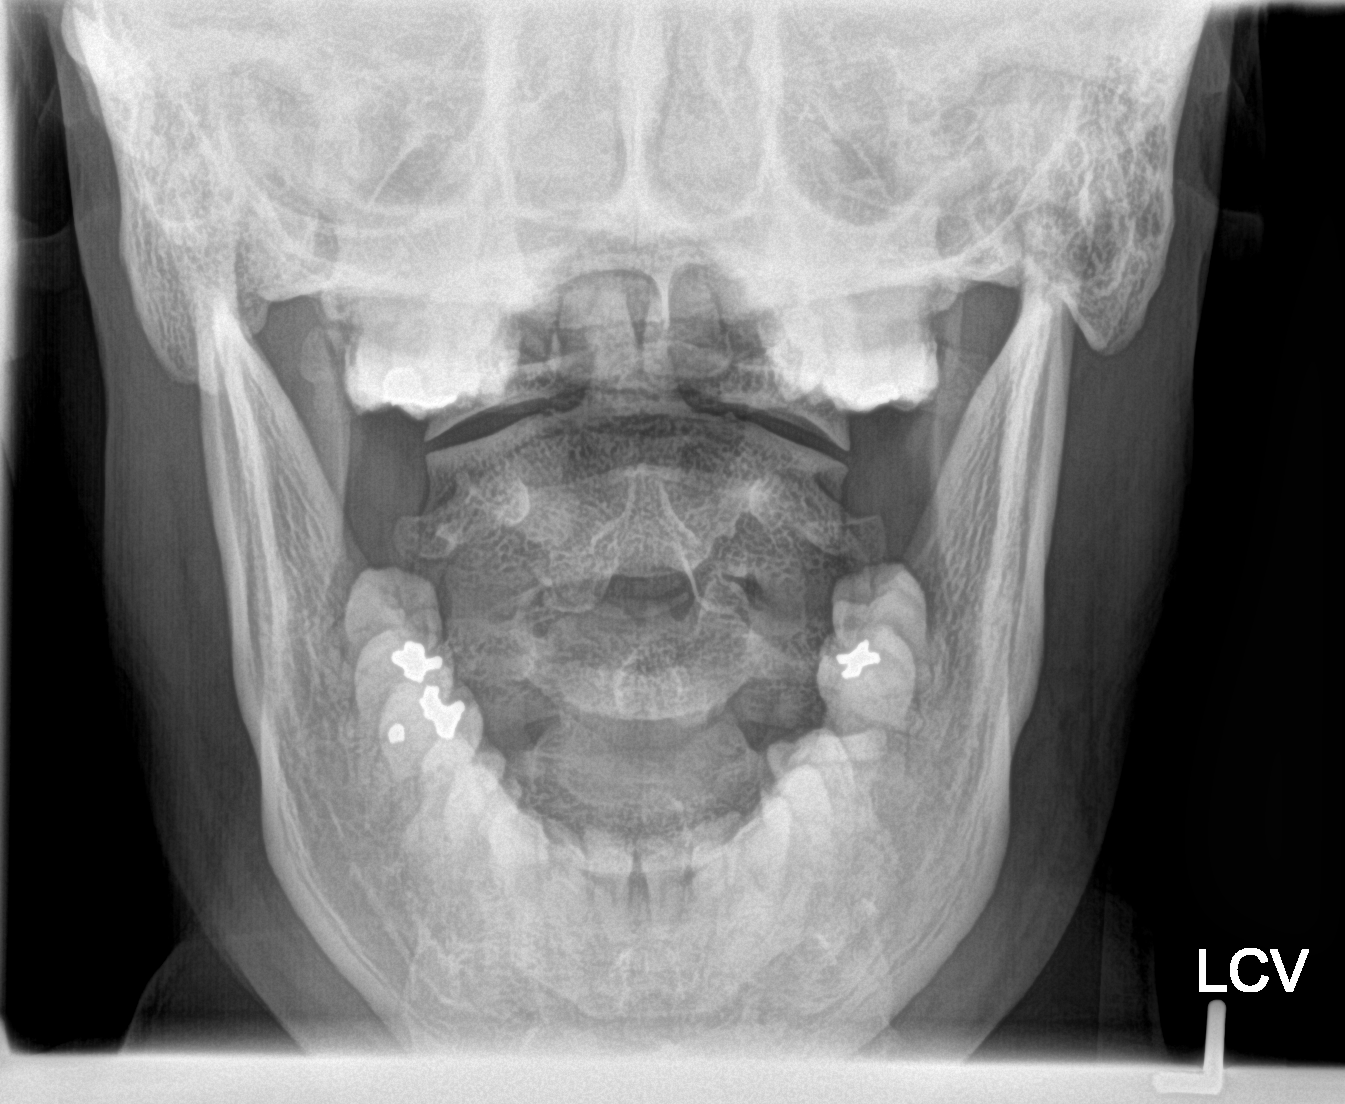

[3 of 3 positions shown; findings below may reference images not displayed]

FINDINGS: There is no evidence of cervical spine fracture or prevertebral soft
tissue swelling. Alignment is normal. No other significant bone
abnormalities are identified.
IMPRESSION: Negative cervical spine radiographs.

## 2018-04-08 ENCOUNTER — Emergency Department
Admission: EM | Admit: 2018-04-08 | Discharge: 2018-04-08 | Disposition: A | Discharge status: Home / Self Care | Payer: Medicaid Other | Attending: Emergency Medicine | Admitting: Emergency Medicine

## 2018-04-08 ENCOUNTER — Encounter: Payer: Self-pay | Admitting: Emergency Medicine

## 2018-04-08 DIAGNOSIS — M5442 Lumbago with sciatica, left side: Secondary | ICD-10-CM | POA: Diagnosis not present

## 2018-04-08 DIAGNOSIS — M545 Low back pain: Secondary | ICD-10-CM | POA: Diagnosis present

## 2018-04-08 DIAGNOSIS — M5441 Lumbago with sciatica, right side: Secondary | ICD-10-CM | POA: Insufficient documentation

## 2018-04-08 DIAGNOSIS — F17210 Nicotine dependence, cigarettes, uncomplicated: Secondary | ICD-10-CM | POA: Insufficient documentation

## 2018-04-08 DIAGNOSIS — J45909 Unspecified asthma, uncomplicated: Secondary | ICD-10-CM | POA: Insufficient documentation

## 2018-04-08 MED ORDER — CYCLOBENZAPRINE HCL 5 MG PO TABS: ORAL_TABLET | ORAL | 0 refills | Status: DC

## 2018-04-08 MED ORDER — KETOROLAC TROMETHAMINE 30 MG/ML IJ SOLN
30.0000 mg | Freq: Once | INTRAMUSCULAR | Status: AC
  Administered 2018-04-08: 30 mg via INTRAMUSCULAR
  Filled 2018-04-08: qty 1

## 2018-04-08 MED ORDER — IBUPROFEN 600 MG PO TABS: 600.0000 mg | ORAL_TABLET | Freq: Four times a day (QID) | ORAL | 0 refills | Status: DC | PRN

## 2018-04-08 MED ORDER — TRAMADOL HCL 50 MG PO TABS
50.0000 mg | ORAL_TABLET | Freq: Once | ORAL | Status: AC
  Administered 2018-04-08: 50 mg via ORAL
  Filled 2018-04-08: qty 1

## 2018-04-08 MED ORDER — ORPHENADRINE CITRATE 30 MG/ML IJ SOLN
60.0000 mg | Freq: Two times a day (BID) | INTRAMUSCULAR | Status: DC
  Administered 2018-04-08: 60 mg via INTRAMUSCULAR
  Filled 2018-04-08: qty 2

## 2018-04-08 MED ORDER — TRAMADOL HCL 50 MG PO TABS: 50.0000 mg | ORAL_TABLET | Freq: Four times a day (QID) | ORAL | 0 refills | Status: DC | PRN

## 2018-04-08 NOTE — ED Provider Notes (Signed)
[facility] Emergency Department Provider Note  ____________________________________________  Time seen: Approximately 10:26 AM  I have reviewed the triage vital signs and the nursing notes.   HISTORY  Chief Complaint Back Pain    HPI Charles Benitez is a 43 y.o. male that presents to the emergency department for worsening back pain for 2 days. Pain is primarily mid to low back. Pain occasionally radiates into both legs. Patient has chronic back pain that flares occasionally. No bowel or bladder dysfunction or saddle anesthesia. No vomiting, weakness, numbness, tingling.   Past Medical History:  Diagnosis Date  . Asthma   . Chronic back pain     There are no active problems to display for this patient.   History reviewed. No pertinent surgical history.  Prior to Admission medications   Medication Sig Start Date End Date Taking? Authorizing Provider  cyclobenzaprine (FLEXERIL) 5 MG tablet Take 1-2 tablets 3 times daily as needed 04/08/18   Enid Derry, PA-C  HYDROcodone-acetaminophen (NORCO/VICODIN) 5-325 MG tablet Take 1 tablet by mouth every 6 (six) hours as needed for moderate pain. Patient not taking: Reported on 06/05/2017 12/27/16   Tommi Rumps, PA-C  ibuprofen (ADVIL,MOTRIN) 600 MG tablet Take 1 tablet (600 mg total) by mouth every 6 (six) hours as needed. 04/08/18   Enid Derry, PA-C  methocarbamol (ROBAXIN) 500 MG tablet 1-2 tablets every 6 hours prn muscle spasms Patient not taking: Reported on 06/05/2017 12/27/16   Tommi Rumps, PA-C  naproxen (NAPROSYN) 500 MG tablet Take 1 tablet (500 mg total) by mouth 2 (two) times daily with a meal. Patient not taking: Reported on 06/05/2017 12/27/16   Tommi Rumps, PA-C  ondansetron (ZOFRAN) 4 MG tablet Take 1 tablet (4 mg total) by mouth daily as needed. 06/05/17 06/05/18  Merrily Brittle, MD  traMADol (ULTRAM) 50 MG tablet Take 1 tablet (50 mg total) by mouth every 6 (six) hours as needed.  04/08/18 04/08/19  Enid Derry, PA-C    Allergies Shrimp [shellfish allergy]  No family history on file.  Social History Social History   Tobacco Use  . Smoking status: Current Every Day Smoker    Packs/day: 0.30    Types: Cigarettes  . Smokeless tobacco: Never Used  Substance Use Topics  . Alcohol use: Yes  . Drug use: Yes    Types: Marijuana     Review of Systems  Constitutional: No fever/chills Respiratory: No SOB. Gastrointestinal: No abdominal pain.  No nausea, no vomiting.  Musculoskeletal: Positive for back pain.  Skin: Negative for rash, abrasions, lacerations, ecchymosis. Neurological: Negative for headaches, numbness or tingling   ____________________________________________   PHYSICAL EXAM:  VITAL SIGNS: ED Triage Vitals  Enc Vitals Group     BP 04/08/18 0844 (!) 112/46     Pulse Rate 04/08/18 0844 73     Resp 04/08/18 0844 20     Temp 04/08/18 0844 98.3 F (36.8 C)     Temp Source 04/08/18 0844 Oral     SpO2 04/08/18 0844 97 %     Weight 04/08/18 0845 168 lb (76.2 kg)     Height 04/08/18 0845 5\' 5"  (1.651 m)     Head Circumference --      Peak Flow --      Pain Score 04/08/18 0845 8     Pain Loc --      Pain Edu? --      Excl. in GC? --      Constitutional: Alert and oriented.  Well appearing and in no acute distress. Eyes: Conjunctivae are normal. PERRL. EOMI. Head: Atraumatic. ENT:      Ears:      Nose: No congestion/rhinnorhea.      Mouth/Throat: Mucous membranes are moist.  Neck: No stridor.   Cardiovascular: Normal rate, regular rhythm.  Good peripheral circulation. Respiratory: Normal respiratory effort without tachypnea or retractions. Lungs CTAB. Good air entry to the bases with no decreased or absent breath sounds. Gastrointestinal: Bowel sounds 4 quadrants. Soft and nontender to palpation. No guarding or rigidity. No palpable masses. No distention.  Musculoskeletal: Full range of motion to all extremities. No gross deformities  appreciated. Diffuse tenderness to palpation of lumbar spine and lumbar paraspinal muscles. Strength equal in upper and lower extremities bilaterally. Full ROM of bilateral hips. Normal gait. No foot drop.  Neurologic:  Normal speech and language. No gross focal neurologic deficits are appreciated.  Skin:  Skin is warm, dry and intact. No rash noted. Psychiatric: Mood and affect are normal. Speech and behavior are normal. Patient exhibits appropriate insight and judgement.   ____________________________________________   LABS (all labs ordered are listed, but only abnormal results are displayed)  Labs Reviewed - No data to display ____________________________________________  EKG   ____________________________________________  RADIOLOGY   No results found.  ____________________________________________    PROCEDURES  Procedure(s) performed:    Procedures    Medications  orphenadrine (NORFLEX) injection 60 mg (60 mg Intramuscular Given 04/08/18 1116)  traMADol (ULTRAM) tablet 50 mg (50 mg Oral Given 04/08/18 1112)  ketorolac (TORADOL) 30 MG/ML injection 30 mg (30 mg Intramuscular Given 04/08/18 1113)     ____________________________________________   INITIAL IMPRESSION / ASSESSMENT AND PLAN / ED COURSE  Pertinent labs & imaging results that were available during my care of the patient were reviewed by me and considered in my medical decision making (see chart for details).  Review of the [place] CSRS was performed in accordance of the [facility] prior to dispensing any controlled drugs.   Patient presented to emergency department for evaluation of low back pain.  Vital signs and exam are reassuring.  Back pain is similar as previous.  Patient was given IM Toradol, IM Norflex, a dose of tramadol for pain.  Patient declines any imaging at this time.  Patient will be discharged home with prescriptions for tramadol, Motrin, Norflex. Patient is to follow up with primary care as  directed. Patient is given ED precautions to return to the ED for any worsening or new symptoms.     ____________________________________________  FINAL CLINICAL IMPRESSION(S) / ED DIAGNOSES  Final diagnoses:  Acute bilateral low back pain with bilateral sciatica      NEW MEDICATIONS STARTED DURING THIS VISIT:  ED Discharge Orders         Ordered    ibuprofen (ADVIL,MOTRIN) 600 MG tablet  Every 6 hours PRN     04/08/18 1111    cyclobenzaprine (FLEXERIL) 5 MG tablet     04/08/18 1111    traMADol (ULTRAM) 50 MG tablet  Every 6 hours PRN     04/08/18 1111              This chart was dictated using voice recognition software/Dragon. Despite best efforts to proofread, errors can occur which can change the meaning. Any change was purely unintentional.    Enid DerryWagner, [place], PA-C 04/08/18 1328    Don PerkingVeronese, [place], MD 04/08/18 [phone]

## 2018-04-08 NOTE — ED Triage Notes (Signed)
Pt reports back pain. States hx of the same since he was a child. Pt states that this episode started Sunday night.

## 2018-04-08 NOTE — ED Notes (Signed)
See triage note  Presents with generalized back pain   Describes pin as muscle spasm like  Denies any recent injury or urinary sxs'  Hx of same in past  Ambulates well to treatment room

## 2018-09-02 ENCOUNTER — Other Ambulatory Visit: Payer: Self-pay

## 2018-09-02 ENCOUNTER — Emergency Department
Admission: EM | Admit: 2018-09-02 | Discharge: 2018-09-02 | Disposition: A | Discharge status: Home / Self Care | Payer: Medicaid Other | Attending: Student in an Organized Health Care Education/Training Program | Admitting: Student in an Organized Health Care Education/Training Program

## 2018-09-02 DIAGNOSIS — H9201 Otalgia, right ear: Secondary | ICD-10-CM

## 2018-09-02 DIAGNOSIS — F17210 Nicotine dependence, cigarettes, uncomplicated: Secondary | ICD-10-CM | POA: Insufficient documentation

## 2018-09-02 DIAGNOSIS — H9221 Otorrhagia, right ear: Secondary | ICD-10-CM | POA: Insufficient documentation

## 2018-09-02 DIAGNOSIS — J45909 Unspecified asthma, uncomplicated: Secondary | ICD-10-CM | POA: Insufficient documentation

## 2018-09-02 MED ORDER — NEOMYCIN-POLYMYXIN-HC 3.5-10000-1 OT SOLN: 3.0000 [drp] | Freq: Three times a day (TID) | OTIC | 0 refills | Status: AC

## 2018-09-02 NOTE — Discharge Instructions (Addendum)
Do not insert anything in the right ear for 1 week.

## 2018-09-02 NOTE — ED Triage Notes (Addendum)
Pt  Comes via pOV from home with c/o right ear pain. Pt states yesterday he had his ear buds in and hit his ear and it began bleeding. Pt states he took medication and it feels better. Pt states it was also bleeding again this am.
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
## 2019-02-19 ENCOUNTER — Other Ambulatory Visit: Payer: Self-pay

## 2019-02-19 ENCOUNTER — Emergency Department
Admission: EM | Admit: 2019-02-19 | Discharge: 2019-02-19 | Disposition: A | Discharge status: Home / Self Care | Payer: Medicaid Other | Attending: Emergency Medicine | Admitting: Emergency Medicine

## 2019-02-19 ENCOUNTER — Emergency Department: Payer: Medicaid Other

## 2019-02-19 ENCOUNTER — Encounter: Payer: Self-pay | Admitting: Emergency Medicine

## 2019-02-19 DIAGNOSIS — M545 Low back pain, unspecified: Secondary | ICD-10-CM

## 2019-02-19 DIAGNOSIS — F17210 Nicotine dependence, cigarettes, uncomplicated: Secondary | ICD-10-CM | POA: Insufficient documentation

## 2019-02-19 DIAGNOSIS — Z79899 Other long term (current) drug therapy: Secondary | ICD-10-CM | POA: Insufficient documentation

## 2019-02-19 DIAGNOSIS — J45909 Unspecified asthma, uncomplicated: Secondary | ICD-10-CM | POA: Diagnosis not present

## 2019-02-19 DIAGNOSIS — F1210 Cannabis abuse, uncomplicated: Secondary | ICD-10-CM | POA: Diagnosis not present

## 2019-02-19 DIAGNOSIS — M47816 Spondylosis without myelopathy or radiculopathy, lumbar region: Secondary | ICD-10-CM | POA: Diagnosis not present

## 2019-02-19 DIAGNOSIS — M546 Pain in thoracic spine: Secondary | ICD-10-CM | POA: Diagnosis not present

## 2019-02-19 DIAGNOSIS — M549 Dorsalgia, unspecified: Secondary | ICD-10-CM | POA: Diagnosis present

## 2019-02-19 MED ORDER — TRAMADOL HCL 50 MG PO TABS: 50.0000 mg | ORAL_TABLET | Freq: Four times a day (QID) | ORAL | 0 refills | Status: DC | PRN

## 2019-02-19 MED ORDER — CYCLOBENZAPRINE HCL 5 MG PO TABS: ORAL_TABLET | ORAL | 0 refills | Status: DC

## 2019-02-19 MED ORDER — IBUPROFEN 600 MG PO TABS: 600.0000 mg | ORAL_TABLET | Freq: Four times a day (QID) | ORAL | 0 refills | Status: DC | PRN

## 2019-02-19 MED ORDER — ORPHENADRINE CITRATE 30 MG/ML IJ SOLN
60.0000 mg | Freq: Two times a day (BID) | INTRAMUSCULAR | Status: DC
Start: 2019-02-19 — End: 2019-02-19
  Administered 2019-02-19: 10:00:00 60 mg via INTRAMUSCULAR
  Filled 2019-02-19: qty 2

## 2019-02-19 MED ORDER — KETOROLAC TROMETHAMINE 30 MG/ML IJ SOLN
30.0000 mg | Freq: Once | INTRAMUSCULAR | Status: AC
  Administered 2019-02-19: 30 mg via INTRAMUSCULAR
  Filled 2019-02-19: qty 1

## 2019-02-19 MED ORDER — TRAMADOL HCL 50 MG PO TABS
50.0000 mg | ORAL_TABLET | Freq: Once | ORAL | Status: AC
  Administered 2019-02-19: 11:00:00 50 mg via ORAL
  Filled 2019-02-19: qty 1

## 2019-02-19 NOTE — ED Provider Notes (Signed)
[facility] Emergency Department Provider Note  ____________________________________________  Time seen: Approximately 10:20 AM  I have reviewed the triage vital signs and the nursing notes.   HISTORY  Chief Complaint Back Pain    HPI Charles Benitez is a 43 y.o. male that presents to the emergency department for evaluation of acute on chronic back pain.  Pain started on Monday.  Pain is worse with movement.  Patient has a history of back pain and states this feels the exact same as his previous back pain flareups.  Patient states that when this happens, he comes to the emergency department and gets a shot in the pain resolves.  He denies any bowel or bladder dysfunction.  Pain does not radiate.  No fevers, abdominal pain, numbness, tingling, weakness.   Past Medical History:  Diagnosis Date  . Asthma   . Chronic back pain     There are no problems to display for this patient.   History reviewed. No pertinent surgical history.  Prior to Admission medications   Medication Sig Start Date End Date Taking? Authorizing Provider  cyclobenzaprine (FLEXERIL) 5 MG tablet Take 1-2 tablets 3 times daily as needed 02/19/19   Enid Derry, PA-C  HYDROcodone-acetaminophen (NORCO/VICODIN) 5-325 MG tablet Take 1 tablet by mouth every 6 (six) hours as needed for moderate pain. Patient not taking: Reported on 06/05/2017 12/27/16   Tommi Rumps, PA-C  ibuprofen (ADVIL) 600 MG tablet Take 1 tablet (600 mg total) by mouth every 6 (six) hours as needed. 02/19/19   Enid Derry, PA-C  methocarbamol (ROBAXIN) 500 MG tablet 1-2 tablets every 6 hours prn muscle spasms Patient not taking: Reported on 06/05/2017 12/27/16   Tommi Rumps, PA-C  naproxen (NAPROSYN) 500 MG tablet Take 1 tablet (500 mg total) by mouth 2 (two) times daily with a meal. Patient not taking: Reported on 06/05/2017 12/27/16   Tommi Rumps, PA-C  traMADol (ULTRAM) 50 MG tablet Take 1 tablet (50  mg total) by mouth every 6 (six) hours as needed. 02/19/19 02/19/20  Enid Derry, PA-C    Allergies Shrimp [shellfish allergy]  History reviewed. No pertinent family history.  Social History Social History   Tobacco Use  . Smoking status: Current Every Day Smoker    Packs/day: 0.30    Types: Cigarettes  . Smokeless tobacco: Never Used  Substance Use Topics  . Alcohol use: Yes  . Drug use: Yes    Types: Marijuana     Review of Systems  Constitutional: No fever/chills Gastrointestinal: No abdominal pain.  No nausea, no vomiting.  Musculoskeletal: Positive for back pain. Skin: Negative for rash, abrasions, lacerations, ecchymosis. Neurological: Negative for numbness or tingling   ____________________________________________   PHYSICAL EXAM:  VITAL SIGNS: ED Triage Vitals [02/19/19 0616]  Enc Vitals Group     BP 123/76     Pulse Rate 63     Resp 20     Temp 98.7 F (37.1 C)     Temp Source Oral     SpO2 98 %     Weight 170 lb (77.1 kg)     Height 5\' 6"  (1.676 m)     Head Circumference      Peak Flow      Pain Score      Pain Loc      Pain Edu?      Excl. in GC?      Constitutional: Alert and oriented. Well appearing and in no acute distress. Eyes: Conjunctivae  are normal. PERRL. EOMI. Head: Atraumatic. ENT:      Ears:      Nose: No congestion/rhinnorhea.      Mouth/Throat: Mucous membranes are moist.  Neck: No stridor.  Cardiovascular: Normal rate.  Good peripheral circulation. Respiratory: Normal respiratory effort without tachypnea or retractions.  Gastrointestinal: Soft and nontender to palpation. No guarding or rigidity. No palpable masses. No distention.  Musculoskeletal: Full range of motion to all extremities. No gross deformities appreciated. Diffuse tenderness to palpation of lumbar spine and lumbar paraspinal muscles.  Strength equal to lower extremities bilaterally.  Slow but normal gait.  Patient able to ambulate himself without  difficulty from triage to flex area. Neurologic:  Normal speech and language. No gross focal neurologic deficits are appreciated.  Skin:  Skin is warm, dry and intact. No rash noted. Psychiatric: Mood and affect are normal. Speech and behavior are normal. Patient exhibits appropriate insight and judgement.   ____________________________________________   LABS (all labs ordered are listed, but only abnormal results are displayed)  Labs Reviewed - No data to display ____________________________________________  EKG   ____________________________________________  RADIOLOGY Lexine BatonI, Neida Ellegood, personally viewed and evaluated these images (plain radiographs) as part of my medical decision making, as well as reviewing the written report by the radiologist.  DG Lumbar Spine 2-3 Views  Result Date: 02/19/2019 CLINICAL DATA:  Mid lower back pain EXAM: LUMBAR SPINE - 2-3 VIEW COMPARISON:  2016 FINDINGS: Stable vertebral body heights and alignment. 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 Mild disc space narrowing at T12-L1 and L5-S1 similar to the prior study. Mild facet hypertrophy at L5-S1. IMPRESSION: Mild degenerative changes similar to the prior study. Electronically Signed   By: Guadlupe SpanishPraneil  Patel M.D.   On: 02/19/2019 09:02    ____________________________________________    PROCEDURES  Procedure(s) performed:    Procedures    Medications  ketorolac (TORADOL) 30 MG/ML injection 30 mg (has no administration in time range)  orphenadrine (NORFLEX) injection 60 mg (has no administration in time range)  traMADol (ULTRAM) tablet 50 mg (has no administration in time range)     ____________________________________________   INITIAL IMPRESSION / ASSESSMENT AND PLAN / ED COURSE  Pertinent labs & imaging results that were available during my care of the patient were reviewed by me and considered in my medical decision making (see chart for details).  Review of  the [place] CSRS was performed in accordance of the [facility] prior to dispensing any controlled drugs.   Patient presented to the emergency department for evaluation of acute on chronic back pain.  Vital signs and exam are reassuring.  X-ray consistent with degenerative changes.  Patient was given Toradol, Norflex, tramadol for pain.  Patient will be discharged home with prescriptions for ibuprofen, Flexeril, short course of tramadol. Patient is to follow up with orthopedics as directed.  Referral was given to Dr. Yves Dillhasnis.  Patient is given ED precautions to return to the ED for any worsening or new symptoms.   Nolon Stallslberto Westwood was evaluated in Emergency Department on 02/19/2019 for the symptoms described in the history of present illness. He was evaluated in the context of the global COVID-19 pandemic, which necessitated consideration that the patient might be at risk for infection with the SARS-CoV-2 virus that causes COVID-19. Institutional protocols and algorithms that pertain to the evaluation of patients at risk for COVID-19 are in a state of rapid change based on information released by regulatory bodies including the CDC and federal and state organizations.  These policies and algorithms were followed during the patient's care in the ED. ____________________________________________  FINAL CLINICAL IMPRESSION(S) / ED DIAGNOSES  Final diagnoses:  Acute midline low back pain, unspecified whether sciatica present      NEW MEDICATIONS STARTED DURING THIS VISIT:  ED Discharge Orders         Ordered    ibuprofen (ADVIL) 600 MG tablet  Every 6 hours PRN     02/19/19 0946    cyclobenzaprine (FLEXERIL) 5 MG tablet     02/19/19 0946    traMADol (ULTRAM) 50 MG tablet  Every 6 hours PRN     02/19/19 0946              This chart was dictated using voice recognition software/Dragon. Despite best efforts to proofread, errors can occur which can change the meaning. Any change was purely  unintentional.    Laban Emperor, PA-C 02/19/19 1348    Earleen Newport, MD 02/19/19 [phone]

## 2019-02-19 NOTE — ED Notes (Signed)
Patient presents c/o history of chronic back pain.  States worse this time since Monday.  10/10 pain, worse with movement

## 2019-02-19 NOTE — ED Triage Notes (Signed)
Pt c/o lower back spasms and pain. Pt has hx/o same. Has previous diagnosis of degenerative disc disease.

## 2019-06-05 DIAGNOSIS — Z1152 Encounter for screening for COVID-19: Secondary | ICD-10-CM | POA: Diagnosis not present

## 2020-01-11 ENCOUNTER — Other Ambulatory Visit: Payer: Self-pay

## 2020-01-11 ENCOUNTER — Encounter: Payer: Self-pay | Admitting: Emergency Medicine

## 2020-01-11 ENCOUNTER — Emergency Department
Admission: EM | Admit: 2020-01-11 | Discharge: 2020-01-11 | Disposition: A | Discharge status: Home / Self Care | Payer: Medicaid Other | Attending: Emergency Medicine | Admitting: Emergency Medicine

## 2020-01-11 DIAGNOSIS — F17210 Nicotine dependence, cigarettes, uncomplicated: Secondary | ICD-10-CM | POA: Diagnosis not present

## 2020-01-11 DIAGNOSIS — J45909 Unspecified asthma, uncomplicated: Secondary | ICD-10-CM | POA: Insufficient documentation

## 2020-01-11 DIAGNOSIS — M549 Dorsalgia, unspecified: Secondary | ICD-10-CM | POA: Diagnosis present

## 2020-01-11 DIAGNOSIS — M791 Myalgia, unspecified site: Secondary | ICD-10-CM | POA: Diagnosis not present

## 2020-01-11 LAB — BASIC METABOLIC PANEL
Anion gap: 9 (ref 5–15)
BUN: 17 mg/dL (ref 6–20)
CO2: 26 mmol/L (ref 22–32)
Calcium: 9.1 mg/dL (ref 8.9–10.3)
Chloride: 104 mmol/L (ref 98–111)
Creatinine, Ser: 0.82 mg/dL (ref 0.61–1.24)
GFR, Estimated: >60 mL/min (ref >60)
Glucose, Bld: 104 mg/dL — ABNORMAL HIGH (ref 70–99)
Potassium: 4.3 mmol/L (ref 3.5–5.1)
Sodium: 139 mmol/L (ref 135–145)

## 2020-01-11 MED ORDER — CYCLOBENZAPRINE HCL 5 MG PO TABS: ORAL_TABLET | ORAL | 0 refills | Status: DC

## 2020-01-11 MED ORDER — KETOROLAC TROMETHAMINE 60 MG/2ML IM SOLN
30.0000 mg | Freq: Once | INTRAMUSCULAR | Status: AC
  Administered 2020-01-11: 30 mg via INTRAMUSCULAR
  Filled 2020-01-11: qty 2

## 2020-01-11 MED ORDER — IBUPROFEN 600 MG PO TABS: 600.0000 mg | ORAL_TABLET | Freq: Four times a day (QID) | ORAL | 0 refills | Status: DC | PRN

## 2020-01-11 MED ORDER — TRAMADOL HCL 50 MG PO TABS: 50.0000 mg | ORAL_TABLET | Freq: Four times a day (QID) | ORAL | 0 refills | Status: DC | PRN

## 2020-01-11 NOTE — ED Provider Notes (Signed)
[facility] Emergency Department Provider Note ____________________________________________  Time seen: Approximately 12:09 PM  I have reviewed the triage vital signs and the nursing notes.   HISTORY  Chief Complaint Back Pain and Leg Pain    HPI Charles Benitez is a 44 y.o. male with a history of chronic back pain who presents to the emergency department for evaluation and treatment of diffuse body cramping.  Patient states that this has become persistent and happens every day.  He states that his muscles feel as though he has worked out but he has not.  He has had back pain since he can remember.  No alleviating measures attempted prior to arrival.   Past Medical History:  Diagnosis Date  . Asthma   . Chronic back pain     There are no problems to display for this patient.   History reviewed. No pertinent surgical history.  Prior to Admission medications   Medication Sig Start Date End Date Taking? Authorizing Provider  cyclobenzaprine (FLEXERIL) 5 MG tablet Take 1-2 tablets 3 times daily as needed 01/11/20   Luciel Brickman B, FNP  ibuprofen (ADVIL) 600 MG tablet Take 1 tablet (600 mg total) by mouth every 6 (six) hours as needed. 01/11/20   Willia Lampert, Rulon Eisenmenger B, FNP  traMADol (ULTRAM) 50 MG tablet Take 1 tablet (50 mg total) by mouth every 6 (six) hours as needed. 01/11/20 01/10/21  Chinita Pester, FNP    Allergies Shrimp [shellfish allergy]  No family history on file.  Social History Social History   Tobacco Use  . Smoking status: Current Every Day Smoker    Packs/day: 0.30    Types: Cigarettes  . Smokeless tobacco: Never Used  Substance Use Topics  . Alcohol use: Yes  . Drug use: Yes    Types: Marijuana    Review of Systems Constitutional: Negative for fever. Cardiovascular: Negative for chest pain. Respiratory: Negative for shortness of breath. Musculoskeletal: Diffuse body cramping Skin: Negative for open wounds or  lesions. Neurological: Negative for decrease in sensation  ____________________________________________   PHYSICAL EXAM:  VITAL SIGNS: ED Triage Vitals  Enc Vitals Group     BP 01/11/20 1054 135/84     Pulse Rate 01/11/20 1054 82     Resp 01/11/20 1054 20     Temp 01/11/20 1054 98.4 F (36.9 C)     Temp Source 01/11/20 1054 Oral     SpO2 01/11/20 1054 98 %     Weight 01/11/20 1055 168 lb (76.2 kg)     Height 01/11/20 1055 5\' 4"  (1.626 m)     Head Circumference --      Peak Flow --      Pain Score 01/11/20 1055 8     Pain Loc --      Pain Edu? --      Excl. in GC? --     Constitutional: Alert and oriented. Well appearing and in no acute distress. Eyes: Conjunctivae are clear without discharge or drainage Head: Atraumatic Neck: Supple.  No focal bony tenderness. Respiratory: No cough. Respirations are even and unlabored. Musculoskeletal: Full range of motion of extremities demonstrated.  No obvious joint effusions.  No peripheral edema.  Ambulates without assistance with a steady gait. Neurologic: Awake, alert, oriented.  Strength 5 out of 5 in extremities. Skin: No open wounds or lesions noted on exposed skin. Psychiatric: Affect and behavior are appropriate.  ____________________________________________   LABS (all labs ordered are listed, but only abnormal results are displayed)  Labs Reviewed  BASIC METABOLIC PANEL - Abnormal; Notable for the following components:      Result Value   Glucose, Bld 104 (*)    All other components within normal limits   ____________________________________________  RADIOLOGY  Not indicated  I, Antonio Woodhams, personally viewed and evaluated these images (plain radiographs) as part of my medical decision making, as well as reviewing the written report by the radiologist.  No results found. ____________________________________________   PROCEDURES  Procedures  ____________________________________________   INITIAL  IMPRESSION / ASSESSMENT AND PLAN / ED COURSE  Charles Benitez is a 44 y.o. who presents to the emergency department for evaluation of acute on chronic pain.  Patient states that the pain that is typically in his low back has now spread throughout the rest of his body.  He describes this as a cramping type pain.  Plan will be to check electrolytes to ensure there were no abnormalities.  Previous ER chart and image results were reviewed.  No indication for additional x-rays today.  Patient instructed to follow-up with primary care.  He was also instructed to return to the emergency department for symptoms that change or worsen if unable schedule an appointment with orthopedics or primary care.  Medications  ketorolac (TORADOL) injection 30 mg (30 mg Intramuscular Given 01/11/20 1214)    Pertinent labs & imaging results that were available during my care of the patient were reviewed by me and considered in my medical decision making (see chart for details).   _________________________________________   FINAL CLINICAL IMPRESSION(S) / ED DIAGNOSES  Final diagnoses:  Myalgia    ED Discharge Orders         Ordered    cyclobenzaprine (FLEXERIL) 5 MG tablet        01/11/20 1445    ibuprofen (ADVIL) 600 MG tablet  Every 6 hours PRN        01/11/20 1445    traMADol (ULTRAM) 50 MG tablet  Every 6 hours PRN        01/11/20 1445           If controlled substance prescribed during this visit, 12 month history viewed on the [place] prior to issuing an initial prescription for Schedule II or III opiod.    Chinita Pester, FNP 01/12/20 1623    Shaune Pollack, MD 01/13/20 1547

## 2020-01-11 NOTE — Discharge Instructions (Signed)
You blood work does not show any electrolyte abnormalities.  Please find a primary care provider. You can call [facility] to see if anyone is taking new patients.  Return to the ER for symptoms that change or worsen or for new concerns if unable to find a PCP.

## 2020-01-11 NOTE — ED Triage Notes (Signed)
Pt reports has had chronic back pain since he was younger and this Saturday he started having pain in his thighs and neck. Pt reports he has chronic pain and sometimes it gets real bad and he has to come here.

## 2020-01-12 ENCOUNTER — Telehealth: Payer: Self-pay

## 2020-01-12 NOTE — Telephone Encounter (Signed)
Transition Care Management Unsuccessful Follow-up Telephone Call  Date of discharge and from where:  01/11/2020 [facility]  Attempts:  1st Attempt  Reason for unsuccessful TCM follow-up call:  Unable to leave message

## 2020-01-13 NOTE — Telephone Encounter (Signed)
Transition Care Management Follow-up Telephone Call Date of discharge and from where: 11/8/2021[facility]  How have you been since you were released from the hospital? Doing better,started taking medications given at discharge  Any questions or concerns? No  Items Reviewed:  Did the pt receive and understand the discharge instructions provided? Yes   Medications obtained and verified? Yes   Other? No   Any new allergies since your discharge? No   Dietary orders reviewed? Yes  Do you have support at home? Yes   Home Care and Equipment/Supplies: Were home health services ordered? not applicable If so, what is the name of the agency? Not applicable  Has the agency set up a time to come to the patient's home? not applicable Were any new equipment or medical supplies ordered?  No What is the name of the medical supply agency? Not applicable Were you able to get the supplies/equipment? not applicable Do you have any questions related to the use of the equipment or supplies? No  Functional Questionnaire: (I = Independent and D = Dependent) ADLs: I  Bathing/Dressing- I  Meal Prep- I  Eating- I  Maintaining continence- I  Transferring/Ambulation- I  Managing Meds- I  Follow up appointments reviewed:   PCP Hospital f/u appt confirmed? Yes  Scheduled to see Smitty Cords, DO on 03/19/2019 @ 10AM.  Specialist [facility] f/u appt confirmed? No    Are transportation arrangements needed? No   If their condition worsens, is the pt aware to call PCP or go to the Emergency Dept.? Yes  Was the patient provided with contact information for the PCP's office or ED? Yes  Was to pt encouraged to call back with questions or concerns? Yes

## 2020-01-13 NOTE — Telephone Encounter (Signed)
Transition Care Management Unsuccessful Follow-up Telephone Call  Date of discharge and from where:  01/11/2020 [facility]  Attempts:  2nd Attempt  Reason for unsuccessful TCM follow-up call:  Left voice message

## 2020-03-18 ENCOUNTER — Other Ambulatory Visit: Payer: Self-pay

## 2020-03-18 ENCOUNTER — Ambulatory Visit (INDEPENDENT_AMBULATORY_CARE_PROVIDER_SITE_OTHER): Payer: Medicaid Other | Admitting: Family Medicine

## 2020-03-18 ENCOUNTER — Encounter: Payer: Self-pay | Admitting: Family Medicine

## 2020-03-18 VITALS — BP 126/75 | HR 77 | Ht 65.0 in | Wt 167.2 lb

## 2020-03-18 DIAGNOSIS — M5136 Other intervertebral disc degeneration, lumbar region: Secondary | ICD-10-CM

## 2020-03-18 DIAGNOSIS — G8929 Other chronic pain: Secondary | ICD-10-CM | POA: Diagnosis not present

## 2020-03-18 DIAGNOSIS — M542 Cervicalgia: Secondary | ICD-10-CM | POA: Diagnosis not present

## 2020-03-18 DIAGNOSIS — M5442 Lumbago with sciatica, left side: Secondary | ICD-10-CM | POA: Diagnosis not present

## 2020-03-18 DIAGNOSIS — Z6827 Body mass index (BMI) 27.0-27.9, adult: Secondary | ICD-10-CM

## 2020-03-18 DIAGNOSIS — Z7689 Persons encountering health services in other specified circumstances: Secondary | ICD-10-CM | POA: Diagnosis not present

## 2020-03-18 DIAGNOSIS — M4723 Other spondylosis with radiculopathy, cervicothoracic region: Secondary | ICD-10-CM | POA: Diagnosis not present

## 2020-03-18 DIAGNOSIS — M5441 Lumbago with sciatica, right side: Secondary | ICD-10-CM

## 2020-03-18 MED ORDER — CYCLOBENZAPRINE HCL 10 MG PO TABS: 10.0000 mg | ORAL_TABLET | Freq: Three times a day (TID) | ORAL | 2 refills | Status: DC | PRN

## 2020-03-18 MED ORDER — GABAPENTIN 100 MG PO CAPS: ORAL_CAPSULE | ORAL | 1 refills | Status: DC

## 2020-03-18 NOTE — Patient Instructions (Addendum)
Thank you for coming to the office today.  1. For your Back Pain - I think that this is due to Muscle Spasms or strain. Your Sciatic Nerve can be affected causing some of your radiation and numbness down your legs.  EVERY DAY - Start Gabapentin 100mg  capsules, take at night for 2-3 nights only, and then increase to 2 times a day for a few days, and then may increase to 3 times a day, it may make you drowsy, if helps significantly at night only, then you can increase instead to 3 capsules at night, instead of 3 times a day - In the future if needed, we can significantly increase the dose if tolerated well, some common doses are 300mg  three times a day up to 600mg  three times a day, usually it takes several weeks or months to get to higher doses  NEARLY EVERY DAY - May use Tylenol Extra Str 500mg  tabs - may take 1-2 tablets every 6 hours as needed  ONLY AS NEEDED - Start Cyclobenzapine (Flexeril) 10mg  tablets (muscle relaxant) - cut in half for 5mg  at night for muscle relaxant - may make you sedated or sleepy (be careful driving or working on this) if tolerated you can take half to whole tab 2 to 3 times daily or every 8 hours as needed  ONLY AS NEEDED - Start with anti-inflammatory Ibuprofen OTC 200mg  (take 2 or 3 pills at a time) up to 8 hours as needed with meal.  Recommend to start using heating pad on your lower back 1-2x daily for few weeks  Also try a Wedge Seat Cushion to avoid nerve pinching when sitting prolonged period of time.  This pain may take weeks to months to fully resolve, but hopefully it will respond to the medicine initially. All back injuries (small or serious) are slow to heal since we use our back muscles every day. Be careful with turning, twisting, lifting, sitting / standing for prolonged periods, and avoid re-injury.  If your symptoms significantly worsen with more pain, or new symptoms with weakness in one or both legs, new or different shooting leg pains, numbness in  legs or groin, loss of control or retention of urine or bowel movements, please call back for advice and you may need to go directly to the Emergency Department.   Please schedule a Follow-up Appointment to: Return in about 6 weeks (around 04/29/2020) for 6 weeks Back Pain, med adjust.  If you have any other questions or concerns, please feel free to call the office or send a message through MyChart. You may also schedule an earlier appointment if necessary.  Additionally, you may be receiving a survey about your experience at our office within a few days to 1 week by e-mail or mail. We value your feedback.  , DO [facility], 

## 2020-03-18 NOTE — Progress Notes (Signed)
Subjective:    Patient ID: Charles Benitez, male    DOB: 12/10/1975, 45 y.o.   MRN: [id number]  Charles Benitez is a 45 y.o. male presenting on 03/18/2020 for Establish Care and Back Pain  He moved from [place] in 2014. Here to establish with new PCP. Has not had doctor for 20 years   HPI  Chronic Low Back Pain Lumbar DDD with radiculopathy Reports chronic history of back pain and disc disease. He has history of muscle spasms of back. He has occasional episodes of back "locking" and causes significant pain and worse with movement. He can get some other muscle cramps even in lower legs at times. - He admits cramping and soreness every day - also has entire back and shoulder soreness. Symptoms worse after work and resting. Moving helps the pain. - He admits occasionally has bilateral foot sharp pains or neuropathy with radiation of pains into lower extremity both sides. Improved if often can lean forward. - History in past ED would give him Tramadol, Flexeril, Ibuprofen - he has been to ED acutely several times in the past few years. He has received Toradol in past 30mg  injection in ED. - Currently taking Ibuprofen OTC 200mg  x 2 = 400mg  in AM daily - He works at , previously did some work. He has history of some lifting / bending.   Health Maintenance: Declines vaccines today  Depression screen [facility] 2/9 03/18/2020  Decreased Interest 0  Down, Depressed, Hopeless 0  PHQ - 2 Score 0    Past Medical History:  Diagnosis Date  . Asthma   . Chronic back pain    History reviewed. No pertinent surgical history. Social History   Socioeconomic History  . Marital status: Single    Spouse name: Not on file  . Number of children: Not on file  . Years of education: Not on file  . Highest education level: Not on file  Occupational History  . Not on file  Tobacco Use  . Smoking status: Current Every Day Smoker    Packs/day: 0.66    Types: Cigarettes  .  Smokeless tobacco: Never Used  Substance and Sexual Activity  . Alcohol use: Yes    Comment: 2-3 drinks on the weekends   . Drug use: Yes    Types: Marijuana  . Sexual activity: Never  Other Topics Concern  . Not on file  Social History Narrative  . Not on file   Social Determinants of Health   Financial Resource Strain: Not on file  Food Insecurity: Not on file  Transportation Needs: Not on file  Physical Activity: Not on file  Stress: Not on file  Social Connections: Not on file  Intimate Partner Violence: Not on file   History reviewed. No pertinent family history. No current outpatient medications on file prior to visit.   No current facility-administered medications on file prior to visit.    Review of Systems Per HPI unless specifically indicated above      Objective:    BP 126/75   Pulse 77   Ht 5\' 5"  (1.651 m)   Wt 167 lb 3.2 oz (75.8 kg)   SpO2 100%   BMI 27.82 kg/m   Wt Readings from Last 3 Encounters:  03/18/20 167 lb 3.2 oz (75.8 kg)  01/11/20 168 lb (76.2 kg)  02/19/19 170 lb (77.1 kg)    Physical Exam Vitals and nursing note reviewed.  Constitutional:      General: He is not  in acute distress.    Appearance: He is well-developed and well-nourished. He is not diaphoretic.     Comments: Well-appearing, comfortable, cooperative  HENT:     Head: Normocephalic and atraumatic.     Mouth/Throat:     Mouth: Oropharynx is clear and moist.  Eyes:     General:        Right eye: No discharge.        Left eye: No discharge.     Conjunctiva/sclera: Conjunctivae normal.  Neck:     Thyroid: No thyromegaly.     Comments: Bilateral paraspinal cervical muscles with spasm. Cardiovascular:     Rate and Rhythm: Normal rate and regular rhythm.     Pulses: Intact distal pulses.     Heart sounds: Normal heart sounds. No murmur heard.   Pulmonary:     Effort: Pulmonary effort is normal. No respiratory distress.     Breath sounds: Normal breath sounds. No  wheezing or rales.  Musculoskeletal:        General: No edema. Normal range of motion.     Cervical back: Neck supple.     Comments: Low Back Inspection: Normal appearance, no spinal deformity, symmetrical. Palpation: No tenderness over spinous processes. Bilateral lumbar paraspinal muscles mild tender and with R>L mid back  hypertonicity/spasm. ROM: Full active ROM forward flex / back extension, rotation L/R without discomfort Special Testing: Seated SLR negative for radicular pain bilaterally  Strength: Bilateral hip flex/ext 5/5, knee flex/ext 5/5, ankle dorsiflex/plantarflex 5/5 Neurovascular: intact distal sensation to light touch   Lymphadenopathy:     Cervical: No cervical adenopathy.  Skin:    General: Skin is warm and dry.     Findings: No erythema or rash.  Neurological:     Mental Status: He is alert and oriented to person, place, and time.  Psychiatric:        Mood and Affect: Mood and affect normal.        Behavior: Behavior normal.     Comments: Well groomed, good eye contact, normal speech and thoughts      I have personally reviewed the radiology report from 02/19/19 on Lumbar X-ray.  CLINICAL DATA:  Mid lower back pain  EXAM: LUMBAR SPINE - 2-3 VIEW  COMPARISON:  2016  FINDINGS: Stable vertebral body heights and alignment. 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 Mild disc space narrowing at T12-L1 and L5-S1 similar to the prior study. Mild facet hypertrophy at L5-S1.  IMPRESSION: Mild degenerative changes similar to the prior study.   Electronically Signed   By: Guadlupe Spanish M.D.   On: 02/19/2019 09:02  Results for orders placed or performed during the hospital encounter of 01/11/20  Basic metabolic panel  Result Value Ref Range   Sodium 139 135 - 145 mmol/L   Potassium 4.3 3.5 - 5.1 mmol/L   Chloride 104 98 - 111 mmol/L   CO2 26 22 - 32 mmol/L   Glucose, Bld 104 (H) 70 - 99 mg/dL   BUN 17 6 - 20 mg/dL    Creatinine, Ser 9.38 0.61 - 1.24 mg/dL   Calcium 9.1 8.9 - 10.1 mg/dL   GFR, Estimated >75 >10 mL/min   Anion gap 9 5 - 15      Assessment & Plan:   Problem List Items Addressed This Visit    Osteoarthritis of spine with radiculopathy, cervicothoracic region   Relevant Medications   gabapentin (NEURONTIN) 100 MG capsule   cyclobenzaprine (FLEXERIL) 10 MG tablet  DDD (degenerative disc disease), lumbar   Relevant Medications   gabapentin (NEURONTIN) 100 MG capsule   cyclobenzaprine (FLEXERIL) 10 MG tablet   Chronic neck pain   Relevant Medications   gabapentin (NEURONTIN) 100 MG capsule   cyclobenzaprine (FLEXERIL) 10 MG tablet   Chronic bilateral low back pain with bilateral sciatica - Primary   Relevant Medications   gabapentin (NEURONTIN) 100 MG capsule   cyclobenzaprine (FLEXERIL) 10 MG tablet    Other Visit Diagnoses    Encounter to establish care with new doctor       BMI 27.0-27.9,adult          Establish care today. No outside records. ED Visits reviewed in past few years.  Subacute on chronic bilateral mid and low back pain with associated R/L sciatica Suspect likely due to chronic degenerative arthritis (identified on prior x-rays Lumbar spine) and  muscle spasm/strain, without known injury or trauma.  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 Discussed long term management of osteoarthritis and chronic pain - Given his neuropathic component will initiate Gabapentin therapy - Start Gabapentin 100mg  titration, start nightly titrate up and may use during day as tolerated - adjust dose next visit - Start muscle relaxant with Flexeril 10mg  tabs - take 5-10mg  up to TID PRN, titrate up as  tolerated, caution sedation 2. Recommend using Tylenol regularly for breakthrough 3. Next can use oral NSAID Ibuprofen PRN flares only  Encouraged use of heating pad 1-2x daily for now then PRN Handout Chiropractor info [place] if he wants to proceed Follow-up 4-6 weeks if not improved for re-evaluation, consider X-ray imaging other areas of spine, trial of PT, and possibly referral to Orthopedic    Meds ordered this encounter  Medications  . gabapentin (NEURONTIN) 100 MG capsule    Sig: Start 1 capsule daily, increase by 1 cap every 3 days as tolerated up to 3 times a day, or may take 3 at once in evening.    Dispense:  90 capsule    Refill:  1  . cyclobenzaprine (FLEXERIL) 10 MG tablet    Sig: Take 1 tablet (10 mg total) by mouth 3 (three) times daily as needed for muscle spasms.    Dispense:  60 tablet    Refill:  2     Follow up plan: Return in about 6 weeks (around 04/29/2020) for 6 weeks Back Pain, med adjust.  , DO [facility] 03/18/2020, 10:23 AM

## 2020-04-29 ENCOUNTER — Ambulatory Visit: Payer: Medicaid Other | Admitting: Family Medicine

## 2020-05-06 ENCOUNTER — Encounter: Payer: Self-pay | Admitting: Family Medicine

## 2020-05-06 ENCOUNTER — Other Ambulatory Visit: Payer: Self-pay

## 2020-05-06 ENCOUNTER — Other Ambulatory Visit: Payer: Self-pay | Admitting: Family Medicine

## 2020-05-06 ENCOUNTER — Ambulatory Visit (INDEPENDENT_AMBULATORY_CARE_PROVIDER_SITE_OTHER): Payer: Medicaid Other | Admitting: Family Medicine

## 2020-05-06 VITALS — BP 127/79 | HR 65 | Ht 64.0 in | Wt 181.4 lb

## 2020-05-06 DIAGNOSIS — Z0000 Encounter for general adult medical examination without abnormal findings: Secondary | ICD-10-CM

## 2020-05-06 DIAGNOSIS — G8929 Other chronic pain: Secondary | ICD-10-CM | POA: Diagnosis not present

## 2020-05-06 DIAGNOSIS — M4723 Other spondylosis with radiculopathy, cervicothoracic region: Secondary | ICD-10-CM | POA: Diagnosis not present

## 2020-05-06 DIAGNOSIS — Z6827 Body mass index (BMI) 27.0-27.9, adult: Secondary | ICD-10-CM

## 2020-05-06 DIAGNOSIS — M5441 Lumbago with sciatica, right side: Secondary | ICD-10-CM

## 2020-05-06 DIAGNOSIS — M5442 Lumbago with sciatica, left side: Secondary | ICD-10-CM

## 2020-05-06 DIAGNOSIS — M5136 Other intervertebral disc degeneration, lumbar region: Secondary | ICD-10-CM | POA: Diagnosis not present

## 2020-05-06 DIAGNOSIS — R7309 Other abnormal glucose: Secondary | ICD-10-CM

## 2020-05-06 DIAGNOSIS — Z114 Encounter for screening for human immunodeficiency virus [HIV]: Secondary | ICD-10-CM

## 2020-05-06 DIAGNOSIS — Z1159 Encounter for screening for other viral diseases: Secondary | ICD-10-CM

## 2020-05-06 MED ORDER — GABAPENTIN 100 MG PO CAPS: 100.0000 mg | ORAL_CAPSULE | Freq: Every day | ORAL | 1 refills | Status: DC

## 2020-05-06 NOTE — Patient Instructions (Addendum)
Thank you for coming to the office today.  Refilled Gabapentin 100mg  nightly - can increase if ever needed, but for now, keep taking it regularly at night. When ready can get more from pharmacy.  Flexeril is good to keep taking WHEN NEEDED ONLY. Let me know and I can refill.   DUE for FASTING BLOOD WORK (no food or drink after midnight before the lab appointment, only water or coffee without cream/sugar on the morning of)  SCHEDULE "Lab Only" visit in the morning at the clinic for lab draw in 4 MONTHS   - Make sure Lab Only appointment is at about 1 week before your next appointment, so that results will be available  For Lab Results, once available within 2-3 days of blood draw, you can can log in to MyChart online to view your results and a brief explanation. Also, we can discuss results at next follow-up visit.   Please schedule a Follow-up Appointment to: Return in about 4 months (around 09/05/2020) for 4 month fasting lab only then 1 week later Annual Physical.  If you have any other questions or concerns, please feel free to call the office or send a message through MyChart. You may also schedule an earlier appointment if necessary.  Additionally, you may be receiving a survey about your experience at our office within a few days to 1 week by e-mail or mail. We value your feedback.  11/06/2020, DO [facility]

## 2020-05-06 NOTE — Progress Notes (Signed)
Subjective:    Patient ID: Charles Benitez, male    DOB: 1975/05/12, 45 y.o.   MRN: [id number]  Charles Benitez is a 45 y.o. male presenting on 05/06/2020 for Back Pain   HPI   Chronic Low Back Pain Lumbar DDD with radiculopathy Reports chronic history of back pain and disc disease. He has history of muscle spasms of back. He has occasional episodes of back "locking" and causes significant pain and worse with movement. He can get some other muscle cramps even in lower legs at times. - He admits cramping and soreness every day - also has entire back and shoulder soreness. Symptoms worse after work and resting. Moving helps the pain. - He admits occasionally has bilateral foot sharp pains or neuropathy with radiation of pains into lower extremity both sides. Improved if often can lean forward. - History in past ED would give him Tramadol, Flexeril, Ibuprofen - he has been to ED acutely several times in the past few years. He has received Toradol in past 30mg  injection in ED.  Last visit 03/18/20 - see note, for background info. Taking Gabapentin 100mg  once nightly, and Flexeril more PRN not every night. He is doing significantly better. He has resumed work. Still feels some small body aching. Now he has reduced whole body aching it is much better and able to work.  He works at 03/20/20, previously did work. He has history of some lifting / bending.  History of COVID 04/07/20, tested. He felt overnight sweating and chills and fever, then it improved following day. He tried theraflu and OTC medications.   Depression screen PHQ 2/9 03/18/2020  Decreased Interest 0  Down, Depressed, Hopeless 0  PHQ - 2 Score 0    Social History   Tobacco Use  . Smoking status: Current Every Day Smoker    Packs/day: 0.66    Types: Cigarettes  . Smokeless tobacco: Never Used  Substance Use Topics  . Alcohol use: Yes    Comment: 2-3 drinks on the weekends   . Drug use: Yes    Types:  Marijuana    Review of Systems Per HPI unless specifically indicated above     Objective:    BP 127/79   Pulse 65   Ht 5\' 4"  (1.626 m)   Wt 181 lb 6.4 oz (82.3 kg)   SpO2 96%   BMI 31.14 kg/m   Wt Readings from Last 3 Encounters:  05/06/20 181 lb 6.4 oz (82.3 kg)  03/18/20 167 lb 3.2 oz (75.8 kg)  01/11/20 168 lb (76.2 kg)    Physical Exam Vitals and nursing note reviewed.  Constitutional:      General: He is not in acute distress.    Appearance: He is well-developed and well-nourished. He is not diaphoretic.     Comments: Well-appearing, comfortable, cooperative  HENT:     Head: Normocephalic and atraumatic.     Mouth/Throat:     Mouth: Oropharynx is clear and moist.  Eyes:     General:        Right eye: No discharge.        Left eye: No discharge.     Conjunctiva/sclera: Conjunctivae normal.  Cardiovascular:     Rate and Rhythm: Normal rate.  Pulmonary:     Effort: Pulmonary effort is normal.  Musculoskeletal:        General: No edema.  Skin:    General: Skin is warm and dry.     Findings: No erythema  or rash.  Neurological:     Mental Status: He is alert and oriented to person, place, and time.  Psychiatric:        Mood and Affect: Mood and affect normal.        Behavior: Behavior normal.     Comments: Well groomed, good eye contact, normal speech and thoughts    Results for orders placed or performed during the hospital encounter of 01/11/20  Basic metabolic panel  Result Value Ref Range   Sodium 139 135 - 145 mmol/L   Potassium 4.3 3.5 - 5.1 mmol/L   Chloride 104 98 - 111 mmol/L   CO2 26 22 - 32 mmol/L   Glucose, Bld 104 (H) 70 - 99 mg/dL   BUN 17 6 - 20 mg/dL   Creatinine, Ser 7.42 0.61 - 1.24 mg/dL   Calcium 9.1 8.9 - 59.5 mg/dL   GFR, Estimated >63 >87 mL/min   Anion gap 9 5 - 15      Assessment & Plan:   Problem List Items Addressed This Visit    Osteoarthritis of spine with radiculopathy, cervicothoracic region   Relevant Medications    gabapentin (NEURONTIN) 100 MG capsule   DDD (degenerative disc disease), lumbar   Relevant Medications   gabapentin (NEURONTIN) 100 MG capsule   Chronic bilateral low back pain with bilateral sciatica - Primary   Relevant Medications   gabapentin (NEURONTIN) 100 MG capsule      Subacute on chronic bilateral mid and low back pain with associated R/L sciatica Significant improvement on Gabapentin low dose. PRN Muscle relaxant. Function improved. Pain Controlled.  Chronic degenerative arthritis (identified on prior x-rays Lumbar spine) and  muscle spasm/strain, without known injury or trauma.  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 Continue Gabapentin 100mg  nightly, Flexeril 5-10mg  PRN only 2. Recommend using Tylenol regularly for breakthrough  F/u as needed, future X-ray or other evaluation if need   Meds ordered this encounter  Medications  . gabapentin (NEURONTIN) 100 MG capsule    Sig: Take 1 capsule (100 mg total) by mouth at bedtime.    Dispense:  90 capsule    Refill:  1    Keep on file only, changing instructions. Patient does not need yet.      Follow up plan: Return in about 4 months (around 09/05/2020) for 4 month fasting lab only then 1 week later Annual Physical.  Future labs ordered for 09/14/20  09/16/20, DO [facility] 05/06/2020, 3:14 PM

## 2020-07-20 ENCOUNTER — Ambulatory Visit: Payer: Self-pay | Admitting: Family Medicine

## 2020-07-20 ENCOUNTER — Encounter: Payer: Self-pay | Admitting: Family Medicine

## 2020-07-20 ENCOUNTER — Ambulatory Visit: Payer: Medicaid Other | Admitting: Family Medicine

## 2020-07-20 ENCOUNTER — Other Ambulatory Visit: Payer: Self-pay

## 2020-07-20 VITALS — BP 132/78 | HR 75 | Ht 66.0 in | Wt 179.4 lb

## 2020-07-20 DIAGNOSIS — M5442 Lumbago with sciatica, left side: Secondary | ICD-10-CM

## 2020-07-20 DIAGNOSIS — M5136 Other intervertebral disc degeneration, lumbar region: Secondary | ICD-10-CM | POA: Diagnosis not present

## 2020-07-20 DIAGNOSIS — M4723 Other spondylosis with radiculopathy, cervicothoracic region: Secondary | ICD-10-CM | POA: Diagnosis not present

## 2020-07-20 DIAGNOSIS — M5441 Lumbago with sciatica, right side: Secondary | ICD-10-CM | POA: Diagnosis not present

## 2020-07-20 DIAGNOSIS — G8929 Other chronic pain: Secondary | ICD-10-CM | POA: Diagnosis not present

## 2020-07-20 MED ORDER — PREDNISONE 10 MG PO TABS: ORAL_TABLET | ORAL | 0 refills | Status: DC

## 2020-07-20 NOTE — Progress Notes (Signed)
Subjective:    Patient ID: Charles Benitez, male    DOB: 1975-08-09, 45 y.o.   MRN: [id number]  Charles Benitez is a 45 y.o. male presenting on 07/20/2020 for Back Pain   HPI   Chronic Low Back Pain Lumbar DDD with radiculopathy  Recent history, he left work early on Monday 5/16 and then has been out of work yesterday and today (5/17 and 5/18) due to back pain.  New flare up of similar chronic problem, recent onset. No clear trigger for pain. Describes whole back spasm and cramping upper back mid back and low also lower extremities. He has issue with if bending forward he has limitation on sitting back up, has to move slowly.  Currently - Taking Gabapentin 100mg  nightly and Flexeril 10mg  nightly as well OTC Ibuprofen 200mg  x 2   Reports chronic history of back pain and disc disease. He has history of muscle spasms of back. He has occasional episodes of back "locking" and causes significant pain and worse with movement. He can get some other muscle cramps even in lower legs at times. - He admits cramping and soreness every day - also has entire back and shoulder soreness. Symptoms worse after work and resting. Moving helps the pain. - He admits occasionally has bilateral foot sharp pains or neuropathy with radiation of pains into lower extremity both sides. Improved if often can lean forward. - History in past ED would give him Tramadol, Flexeril, Ibuprofen - he has been to ED acutely several times in the past few years. He has received Toradol in past 30mg  injection in ED.  Last visit 03/18/20 - see note, for background info. Taking Gabapentin 100mg  once nightly, and Flexeril more PRN not every night. He is doing significantly better. He has resumed work. Still feels some small body aching. Now he has reduced whole body aching it is much better and able to work.   Depression screen [facility] 2/9 07/20/2020 03/18/2020  Decreased Interest 0 0  Down, Depressed, Hopeless 0 0  PHQ - 2 Score 0 0   Altered sleeping 0 -  Tired, decreased energy 0 -  Change in appetite 0 -  Feeling bad or failure about yourself  0 -  Trouble concentrating 0 -  Moving slowly or fidgety/restless 0 -  Suicidal thoughts 0 -  PHQ-9 Score 0 -  Difficult doing work/chores Not difficult at all -    Social History   Tobacco Use  . Smoking status: Current Every Day Smoker    Packs/day: 0.66    Types: Cigarettes  . Smokeless tobacco: Never Used  Substance Use Topics  . Alcohol use: Yes    Comment: 2-3 drinks on the weekends   . Drug use: Yes    Types: Marijuana    Review of Systems Per HPI unless specifically indicated above     Objective:    BP 132/78   Pulse 75   Ht 5\' 6"  (1.676 m)   Wt 179 lb 6.4 oz (81.4 kg)   SpO2 100%   BMI 28.96 kg/m   Wt Readings from Last 3 Encounters:  07/20/20 179 lb 6.4 oz (81.4 kg)  05/06/20 181 lb 6.4 oz (82.3 kg)  03/18/20 167 lb 3.2 oz (75.8 kg)    Physical Exam   I have personally reviewed the radiology report from 02/19/19 on Lumbar Spine.  Narrative & Impression  CLINICAL DATA:  Mid lower back pain  EXAM: LUMBAR SPINE - 2-3 VIEW  COMPARISON:  2016  FINDINGS: Stable vertebral body heights and alignment. 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 Mild disc space narrowing at T12-L1 and L5-S1 similar to the prior study. Mild facet hypertrophy at L5-S1.  IMPRESSION: Mild degenerative changes similar to the prior study.   Electronically Signed   By: Guadlupe Spanish M.D.   On: 02/19/2019 09:02    Results for orders placed or performed during the hospital encounter of 01/11/20  Basic metabolic panel  Result Value Ref Range   Sodium 139 135 - 145 mmol/L   Potassium 4.3 3.5 - 5.1 mmol/L   Chloride 104 98 - 111 mmol/L   CO2 26 22 - 32 mmol/L   Glucose, Bld 104 (H) 70 - 99 mg/dL   BUN 17 6 - 20 mg/dL   Creatinine, Ser 0.56 0.61 - 1.24 mg/dL   Calcium 9.1 8.9 - 97.9 mg/dL   GFR, Estimated >48 >01 mL/min    Anion gap 9 5 - 15      Assessment & Plan:   Problem List Items Addressed This Visit    Osteoarthritis of spine with radiculopathy, cervicothoracic region   Relevant Medications   predniSONE (DELTASONE) 10 MG tablet   Other Relevant Orders   Ambulatory referral to Orthopedic Surgery   DDD (degenerative disc disease), lumbar   Relevant Medications   predniSONE (DELTASONE) 10 MG tablet   Other Relevant Orders   Ambulatory referral to Orthopedic Surgery   Chronic bilateral low back pain with bilateral sciatica - Primary   Relevant Medications   predniSONE (DELTASONE) 10 MG tablet   Other Relevant Orders   Ambulatory referral to Orthopedic Surgery      Acute on chronicbilateral mid and low back painwith associated R/L sciatica Significant improvement on Gabapentin low dose. PRN Muscle relaxant.  Chronic degenerative arthritis (identified on prior x-rays Lumbar spine) andmuscle spasm/strain, without known injury or trauma.  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 Increase dose Gabapentin 100mg  nightly to 200-300mg   Continue Flexeril 5-10mg  PRN only  2. Recommend using Tylenol regularly for breakthrough  Add prednisone taper now Referral to Orthopedics for chronic back pain episodic multiple areas of spinal symptoms prior DDD    Orders Placed This Encounter  Procedures  . Ambulatory referral to Orthopedic Surgery    Referral Priority:   Routine    Referral Type:   Surgical    Referral Reason:   Specialty Services Required    Requested Specialty:   Orthopedic Surgery    Number of Visits Requested:   1      Meds ordered this encounter  Medications  . predniSONE (DELTASONE) 10 MG tablet     Sig: Take 6 tabs with breakfast Day 1, 5 tabs Day 2, 4 tabs Day 3, 3 tabs Day 4, 2 tabs Day 5, 1 tab Day 6.    Dispense:  21 tablet    Refill:  0      Follow up plan: Return if symptoms worsen or fail to improve.   , DO [facility] 07/20/2020, 2:03 PM

## 2020-07-20 NOTE — Patient Instructions (Addendum)
Thank you for coming to the office today.  1. For your Back Pain - I think that this is due to Muscle Spasms or strain. Your Sciatic Nerve can be affected causing some of your radiation and numbness down your legs.  - Start Prednisone taper (steroid anti-inflammatory) for nerve irritation with pain in legs. Each pill is 10mg . Take 6 pills (60mg  daily) for 1 day at same time with breakfast, then each day reduce dose by 1 pill, so 5 pills, then 4, then 3, then 2 then 1 (last 6 days). Do not take any Ibuprofen or Aleve while taking the Prednisone.  - Once finished Prednisone, then start with other anti-inflammatory OTC Aleve or Naproxen 500mg  one pill with food every 12 hours (or 2 times a day) OR you can chose Ibuprofen 600mg  (take 3 of the 200mg  pills) per dose with food every 6 to 8 hours or 3 times a day, take it every day for at least 2 to 4 weeks then only as needed   3. Cyclobenzapine (Flexeril) 10mg  tablets - cut in half for 5mg  at night for muscle relaxant - may make you sedated or sleepy (be careful driving or working on this) if tolerated you can take every 8 hours, half or whole tab 4. May use Tylenol Extra Str 500mg  tabs - may take 1-2 tablets every 6 hours as needed 5. Recommend to start using heating pad on your lower back 1-2x daily for few weeks  Also try a Wedge Seat Cushion to avoid nerve pinching when sitting prolonged period of time.  This pain may take weeks to months to fully resolve, but hopefully it will respond to the medicine initially. All back injuries (small or serious) are slow to heal since we use our back muscles every day. Be careful with turning, twisting, lifting, sitting / standing for prolonged periods, and avoid re-injury.  If your symptoms significantly worsen with more pain, or new symptoms with weakness in one or both legs, new or different shooting leg pains, numbness in legs or groin, loss of control or retention of urine or bowel movements, please call back  for advice and you may need to go directly to the Emergency Department.   [facility] [address] [place],  Phone: [phone]   Please schedule a Follow-up Appointment to: Return if symptoms worsen or fail to improve.  If you have any other questions or concerns, please feel free to call the office or send a message through MyChart. You may also schedule an earlier appointment if necessary.  Additionally, you may be receiving a survey about your experience at our office within a few days to 1 week by e-mail or mail. We value your feedback.  , DO [facility], [facility]

## 2020-08-18 DIAGNOSIS — Z20822 Contact with and (suspected) exposure to covid-19: Secondary | ICD-10-CM | POA: Diagnosis not present

## 2020-09-13 ENCOUNTER — Other Ambulatory Visit: Payer: Self-pay

## 2020-09-13 DIAGNOSIS — M4723 Other spondylosis with radiculopathy, cervicothoracic region: Secondary | ICD-10-CM

## 2020-09-13 DIAGNOSIS — Z0000 Encounter for general adult medical examination without abnormal findings: Secondary | ICD-10-CM

## 2020-09-13 DIAGNOSIS — Z1159 Encounter for screening for other viral diseases: Secondary | ICD-10-CM

## 2020-09-13 DIAGNOSIS — Z6827 Body mass index (BMI) 27.0-27.9, adult: Secondary | ICD-10-CM

## 2020-09-13 DIAGNOSIS — R7309 Other abnormal glucose: Secondary | ICD-10-CM

## 2020-09-13 DIAGNOSIS — Z114 Encounter for screening for human immunodeficiency virus [HIV]: Secondary | ICD-10-CM

## 2020-09-14 ENCOUNTER — Other Ambulatory Visit: Payer: Medicaid Other

## 2020-09-21 ENCOUNTER — Encounter: Payer: Medicaid Other | Admitting: Family Medicine

## 2020-09-22 ENCOUNTER — Telehealth: Payer: Self-pay | Admitting: Family Medicine

## 2020-09-22 NOTE — Telephone Encounter (Signed)
..   Medicaid Managed Care   Unsuccessful Outreach Note  09/22/2020 Name: Charles Benitez MRN: [id number] DOB: 1975-10-12  Referred by: Smitty Cords, DO Reason for referral : High Risk Managed Medicaid (Called patient today to get him scheduled with the MM Team. Person that answered the phone said he was not home. I advised I would call later in the day.)   An unsuccessful telephone outreach was attempted today. The patient was referred to the case management team for assistance with care management and care coordination.   Follow Up Plan: The care management team will reach out to the patient again over the next 7-14 days.   Weston Settle Care Guide, High Risk Medicaid Managed Care Embedded Care Coordination [facility]  [facility]

## 2020-10-04 DIAGNOSIS — Z20822 Contact with and (suspected) exposure to covid-19: Secondary | ICD-10-CM | POA: Diagnosis not present

## 2020-10-05 DIAGNOSIS — M5442 Lumbago with sciatica, left side: Secondary | ICD-10-CM | POA: Diagnosis not present

## 2020-10-05 DIAGNOSIS — G8929 Other chronic pain: Secondary | ICD-10-CM | POA: Diagnosis not present

## 2020-10-05 DIAGNOSIS — M5441 Lumbago with sciatica, right side: Secondary | ICD-10-CM | POA: Diagnosis not present

## 2020-10-05 DIAGNOSIS — M545 Low back pain, unspecified: Secondary | ICD-10-CM | POA: Diagnosis not present

## 2020-10-24 DIAGNOSIS — M4807 Spinal stenosis, lumbosacral region: Secondary | ICD-10-CM | POA: Diagnosis not present

## 2020-10-24 DIAGNOSIS — M48061 Spinal stenosis, lumbar region without neurogenic claudication: Secondary | ICD-10-CM | POA: Diagnosis not present

## 2020-10-24 DIAGNOSIS — M47816 Spondylosis without myelopathy or radiculopathy, lumbar region: Secondary | ICD-10-CM | POA: Diagnosis not present

## 2020-11-02 DIAGNOSIS — M545 Low back pain, unspecified: Secondary | ICD-10-CM | POA: Diagnosis not present

## 2020-11-02 DIAGNOSIS — G8929 Other chronic pain: Secondary | ICD-10-CM | POA: Diagnosis not present

## 2020-11-05 IMAGING — CR DG LUMBAR SPINE 2-3V
1 series · 3 of 3 positions shown · non-contrast
Comparison: 9077

CLINICAL DATA: Mid lower back pain

EXAM:
LUMBAR SPINE - 2-3 VIEW

[Series 1: dg lumbar spine 2-3 views · 0.14mm/px · 3 of 3 slices shown]
[im 1/3]
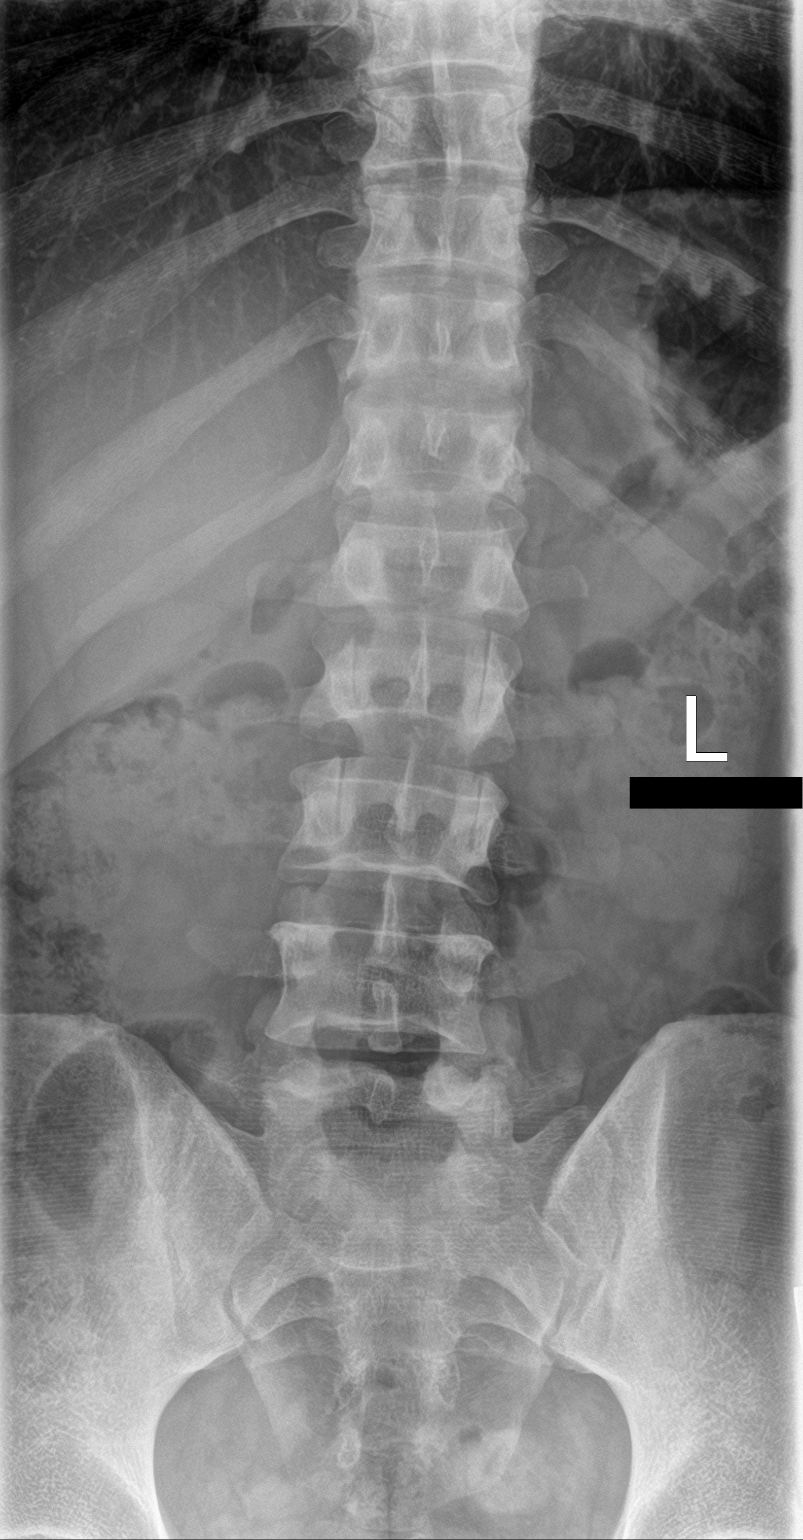
[im 2/3]
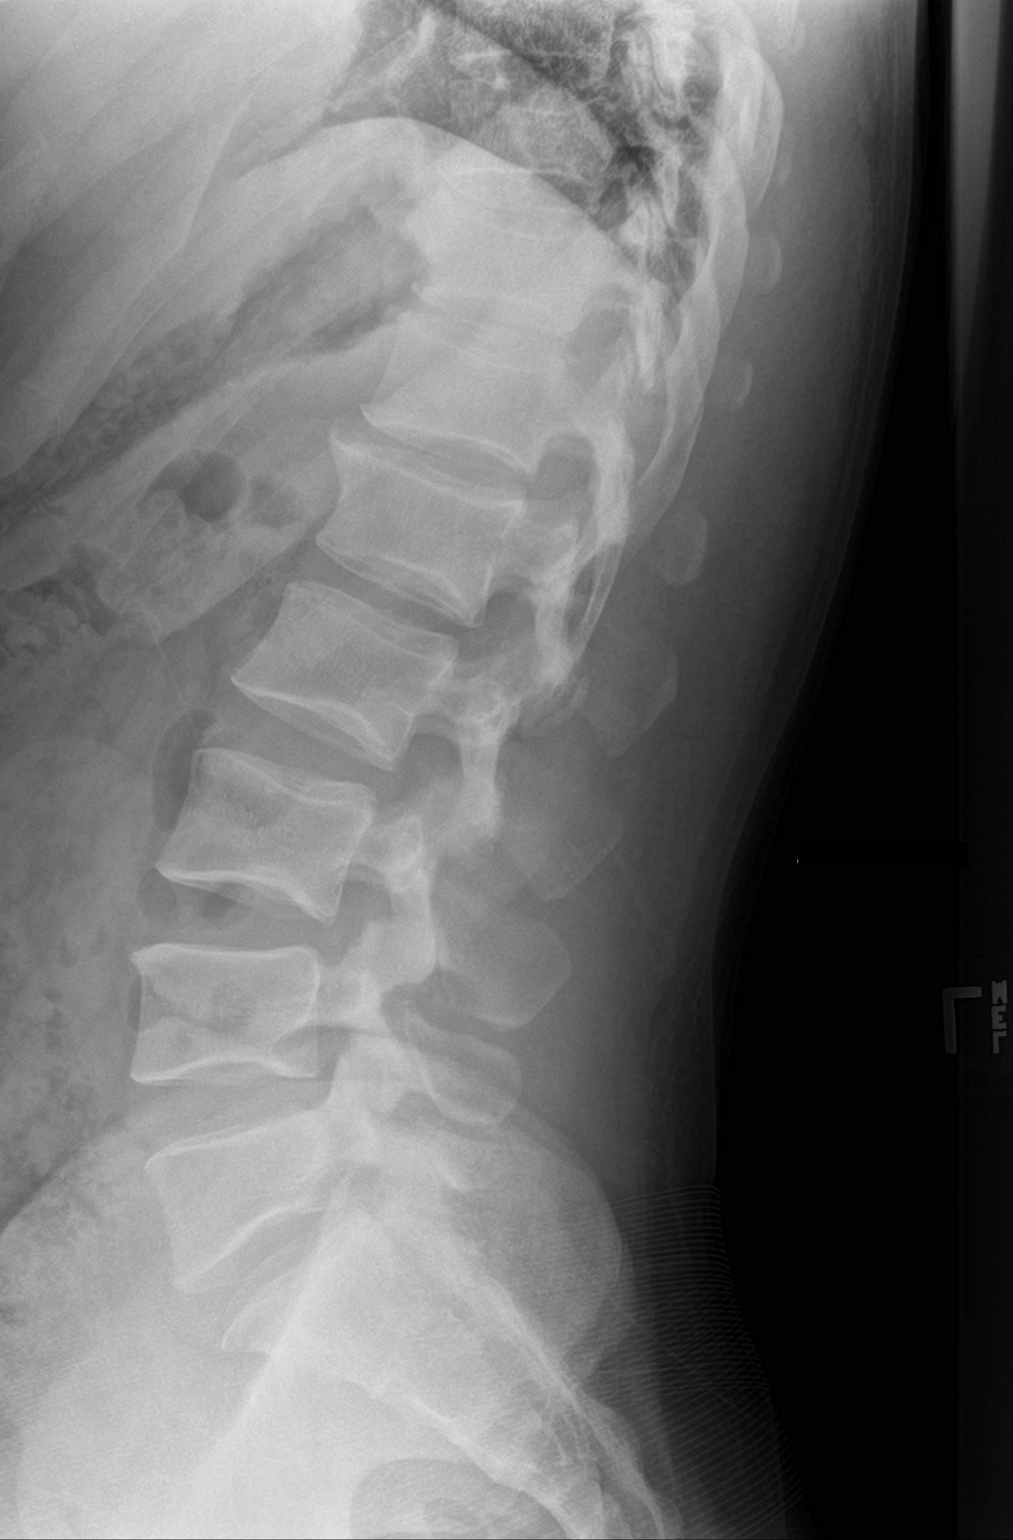
[im 3/3]
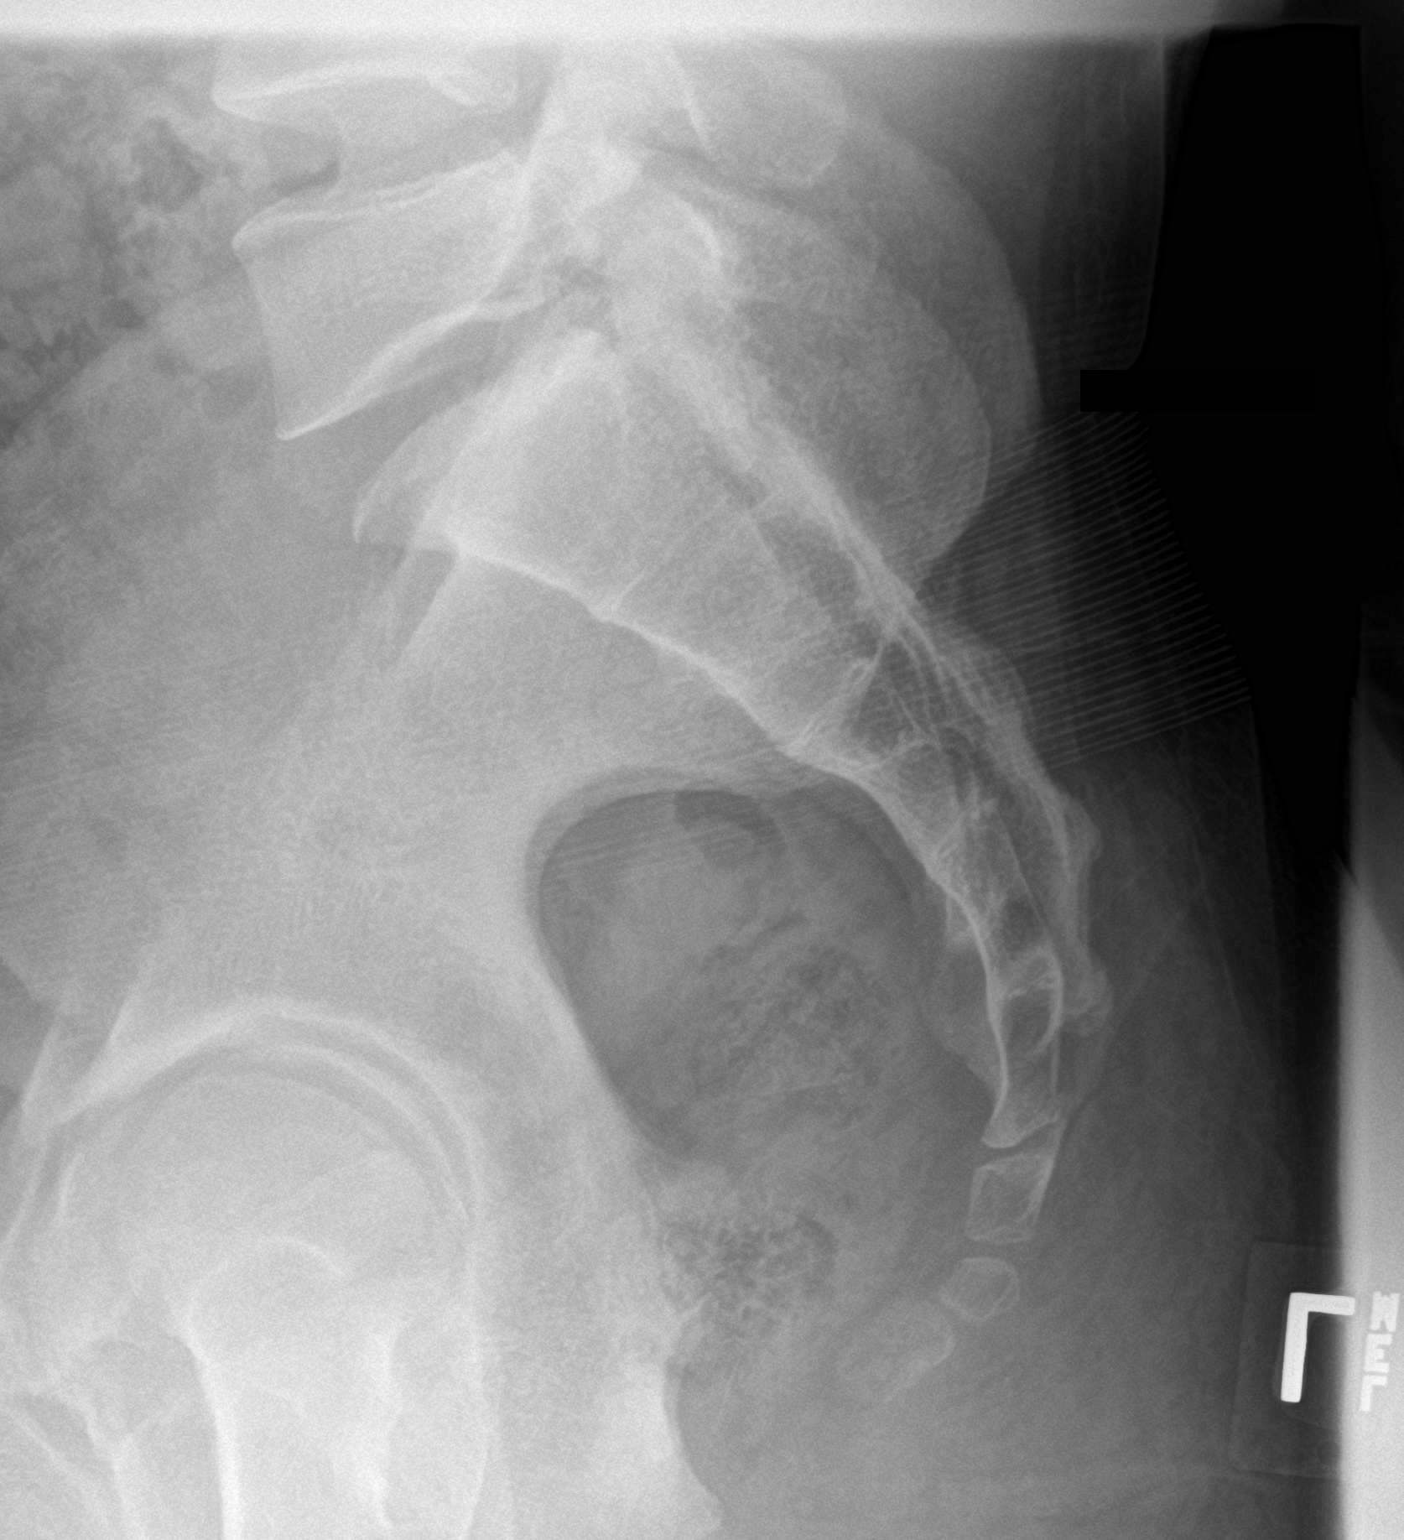

[3 of 3 positions shown; findings below may reference images not displayed]

FINDINGS: Stable vertebral body heights and alignment. Probable congenital
narrowing of the lower lumbar spinal canal. Mild disc space
narrowing at T12-L1 and L5-S1 similar to the prior study. Mild facet
hypertrophy at L5-S1.
IMPRESSION: Mild degenerative changes similar to the prior study.

## 2020-11-13 ENCOUNTER — Emergency Department
Admission: EM | Admit: 2020-11-13 | Discharge: 2020-11-13 | Disposition: A | Discharge status: Home / Self Care | Payer: Medicaid Other | Attending: Emergency Medicine | Admitting: Emergency Medicine

## 2020-11-13 ENCOUNTER — Emergency Department: Payer: Medicaid Other

## 2020-11-13 ENCOUNTER — Other Ambulatory Visit: Payer: Self-pay

## 2020-11-13 DIAGNOSIS — Z20822 Contact with and (suspected) exposure to covid-19: Secondary | ICD-10-CM | POA: Insufficient documentation

## 2020-11-13 DIAGNOSIS — F17210 Nicotine dependence, cigarettes, uncomplicated: Secondary | ICD-10-CM | POA: Insufficient documentation

## 2020-11-13 DIAGNOSIS — J9801 Acute bronchospasm: Secondary | ICD-10-CM | POA: Diagnosis not present

## 2020-11-13 DIAGNOSIS — R0602 Shortness of breath: Secondary | ICD-10-CM | POA: Diagnosis not present

## 2020-11-13 DIAGNOSIS — J45909 Unspecified asthma, uncomplicated: Secondary | ICD-10-CM | POA: Insufficient documentation

## 2020-11-13 DIAGNOSIS — R9431 Abnormal electrocardiogram [ECG] [EKG]: Secondary | ICD-10-CM | POA: Diagnosis not present

## 2020-11-13 DIAGNOSIS — B9789 Other viral agents as the cause of diseases classified elsewhere: Secondary | ICD-10-CM | POA: Diagnosis not present

## 2020-11-13 DIAGNOSIS — J069 Acute upper respiratory infection, unspecified: Secondary | ICD-10-CM | POA: Diagnosis not present

## 2020-11-13 DIAGNOSIS — Z8616 Personal history of COVID-19: Secondary | ICD-10-CM | POA: Diagnosis not present

## 2020-11-13 LAB — BASIC METABOLIC PANEL
Anion gap: 7 (ref 5–15)
BUN: 14 mg/dL (ref 6–20)
CO2: 25 mmol/L (ref 22–32)
Calcium: 9.2 mg/dL (ref 8.9–10.3)
Chloride: 106 mmol/L (ref 98–111)
Creatinine, Ser: 0.77 mg/dL (ref 0.61–1.24)
GFR, Estimated: >60 mL/min (ref >60)
Glucose, Bld: 84 mg/dL (ref 70–99)
Potassium: 4.1 mmol/L (ref 3.5–5.1)
Sodium: 138 mmol/L (ref 135–145)

## 2020-11-13 LAB — CBC WITH DIFFERENTIAL/PLATELET
Abs Immature Granulocytes: 0.02 10*3/uL (ref 0.00–0.07)
Basophils Absolute: 0.1 10*3/uL (ref 0.0–0.1)
Basophils Relative: 1 %
Eosinophils Absolute: 0.3 10*3/uL (ref 0.0–0.5)
Eosinophils Relative: 4 %
HCT: 46.2 % (ref 39.0–52.0)
Hemoglobin: 16.1 g/dL (ref 13.0–17.0)
Immature Granulocytes: 0 %
Lymphocytes Relative: 33 %
Lymphs Abs: 2.7 10*3/uL (ref 0.7–4.0)
MCH: 31.6 pg (ref 26.0–34.0)
MCHC: 34.8 g/dL (ref 30.0–36.0)
MCV: 90.6 fL (ref 80.0–100.0)
Monocytes Absolute: 0.9 10*3/uL (ref 0.1–1.0)
Monocytes Relative: 11 %
Neutro Abs: 4.3 10*3/uL (ref 1.7–7.7)
Neutrophils Relative %: 51 %
Platelets: 265 10*3/uL (ref 150–400)
RBC: 5.1 MIL/uL (ref 4.22–5.81)
RDW: 13.6 % (ref 11.5–15.5)
WBC: 8.4 10*3/uL (ref 4.0–10.5)
nRBC: 0 % (ref 0.0–0.2)

## 2020-11-13 MED ORDER — METHYLPREDNISOLONE SODIUM SUCC 125 MG IJ SOLR
125.0000 mg | Freq: Once | INTRAMUSCULAR | Status: AC
  Administered 2020-11-13: 125 mg via INTRAVENOUS
  Filled 2020-11-13: qty 2

## 2020-11-13 MED ORDER — IPRATROPIUM-ALBUTEROL 0.5-2.5 (3) MG/3ML IN SOLN
3.0000 mL | Freq: Once | RESPIRATORY_TRACT | Status: AC
  Administered 2020-11-13: 3 mL via RESPIRATORY_TRACT
  Filled 2020-11-13: qty 3

## 2020-11-13 MED ORDER — PREDNISONE 50 MG PO TABS: 50.0000 mg | ORAL_TABLET | Freq: Every day | ORAL | 0 refills | Status: DC

## 2020-11-13 MED ORDER — ALBUTEROL SULFATE (2.5 MG/3ML) 0.083% IN NEBU: 2.5000 mg | INHALATION_SOLUTION | RESPIRATORY_TRACT | 2 refills | Status: DC | PRN

## 2020-11-13 NOTE — ED Provider Notes (Signed)
[facility] Emergency Department Provider Note   ____________________________________________    I have reviewed the triage vital signs and the nursing notes.   HISTORY  Chief Complaint Shortness of Breath (Asthma, inhalers not working)     HPI Charles Benitez is a 45 y.o. male who presents with complaints of shortness of breath.  Patient reports distant history of asthma, reports upper respiratory infection symptoms that started 3 to 4 days ago, felt a tightness in his chest today.  Better after using albuterol nebulizer at home.  No fevers.  No nausea or vomiting.  Reports he had COVID 8 months ago.  Past Medical History:  Diagnosis Date   Asthma    Chronic back pain     Patient Active Problem List   Diagnosis Date Noted   Chronic bilateral low back pain with bilateral sciatica 03/18/2020   Chronic neck pain 03/18/2020   DDD (degenerative disc disease), lumbar 03/18/2020   Osteoarthritis of spine with radiculopathy, cervicothoracic region 03/18/2020    History reviewed. No pertinent surgical history.  Prior to Admission medications   Medication Sig Start Date End Date Taking? Authorizing Provider  albuterol (PROVENTIL) (2.5 MG/3ML) 0.083% nebulizer solution Take 3 mLs (2.5 mg total) by nebulization every 4 (four) hours as needed for wheezing or shortness of breath. 11/13/20 11/13/21 Yes Jene Every, MD  predniSONE (DELTASONE) 50 MG tablet Take 1 tablet (50 mg total) by mouth daily with breakfast. 11/13/20  Yes Jene Every, MD  cyclobenzaprine (FLEXERIL) 10 MG tablet Take 1 tablet (10 mg total) by mouth 3 (three) times daily as needed for muscle spasms. 03/18/20   Karamalegos, Netta Neat, DO  gabapentin (NEURONTIN) 100 MG capsule Take 1 capsule (100 mg total) by mouth at bedtime. 05/06/20   Althea Charon, Netta Neat, DO     Allergies Shrimp [shellfish allergy]  History reviewed. No pertinent family history.  Social History Social  History   Tobacco Use   Smoking status: Every Day    Packs/day: 0.66    Types: Cigarettes   Smokeless tobacco: Never  Substance Use Topics   Alcohol use: Yes    Comment: 2-3 drinks on the weekends    Drug use: Yes    Types: Marijuana    Review of Systems  Constitutional: No fever/chills Eyes: No visual changes.  ENT: Nasal congestion Cardiovascular: Denies chest pain. Respiratory: As above Gastrointestinal: No abdominal pain.  No nausea, no vomiting.   Genitourinary: Negative for dysuria. Musculoskeletal: Myalgias Skin: Negative for rash. Neurological: Negative for headaches or weakness   ____________________________________________   PHYSICAL EXAM:  VITAL SIGNS: ED Triage Vitals  Enc Vitals Group     BP 11/13/20 1739 127/87     Pulse Rate 11/13/20 1739 74     Resp 11/13/20 1739 20     Temp 11/13/20 1739 98.7 F (37.1 C)     Temp Source 11/13/20 1739 Oral     SpO2 11/13/20 1739 95 %     Weight 11/13/20 1738 79.4 kg (175 lb)     Height 11/13/20 1738 1.626 m (5\' 4" )     Head Circumference --      Peak Flow --      Pain Score 11/13/20 1736 0     Pain Loc --      Pain Edu? --      Excl. in [place]? --     Constitutional: Alert and oriented. No acute distress. Pleasant and interactive  Nose: No congestion/rhinnorhea. Mouth/Throat: Mucous membranes are  moist.   Neck:  Painless ROM Cardiovascular: Normal rate, regular rhythm. Grossly normal heart sounds.  Good peripheral circulation. Respiratory: Normal respiratory effort.  No retractions.  Scattered mild wheezes Gastrointestinal: Soft and nontender. No distention.  No CVA tenderness.  Musculoskeletal: No lower extremity tenderness nor edema.  Warm and well perfused  Skin:  Skin is warm, dry and intact. No rash noted. Psychiatric: Mood and affect are normal. Speech and behavior are normal.  ____________________________________________   LABS (all labs ordered are listed, but only abnormal results are  displayed)  Labs Reviewed  SARS CORONAVIRUS 2 (TAT 6-24 HRS)  CBC WITH DIFFERENTIAL/PLATELET  BASIC METABOLIC PANEL   ____________________________________________  EKG  ED ECG REPORT I, Jene Every, the attending physician, personally viewed and interpreted this ECG.  Date: 11/13/2020  Rhythm: normal sinus rhythm QRS Axis: normal Intervals: normal ST/T Wave abnormalities: normal Narrative Interpretation: no evidence of acute ischemia  ____________________________________________  RADIOLOGY  Chest x-ray reviewed by me, no evidence of pneumonia, no pneumothorax ____________________________________________   PROCEDURES  Procedure(s) performed: No  Procedures   Critical Care performed: No ____________________________________________   INITIAL IMPRESSION / ASSESSMENT AND PLAN / ED COURSE  Pertinent labs & imaging results that were available during my care of the patient were reviewed by me and considered in my medical decision making (see chart for details).   Patient overall well-appearing in no acute distress, vital signs reassuring, scattered mild wheezes on exam suspect bronchospasm related upper respiratory infection, treated with DuoNeb, IV Solu-Medrol.  Chest x-ray lab work unremarkable.  No evidence of pneumonia  COVID test pending.  Reevaluation, no further wheezing, patient feeling improved, will Rx prednisone x4 days, refilled albuterol nebulizer, outpatient follow-up as needed.    ____________________________________________   FINAL CLINICAL IMPRESSION(S) / ED DIAGNOSES  Final diagnoses:  Viral upper respiratory tract infection  Bronchospasm        Note:  This document was prepared using Dragon voice recognition software and may include unintentional dictation errors.    Jene Every, MD 11/13/20 1949

## 2020-11-13 NOTE — ED Notes (Signed)
Pt in xray

## 2020-11-13 NOTE — ED Triage Notes (Signed)
Pt to ED for asthma exascerbation, feels SOB and inhalers not working. Asthma attack was brought on by coughing which started about 4-5 days ago. Denies covid exposure. Denies CP, NVD, fevers. Speaking in full sentences, ambulatory to room.

## 2020-11-14 ENCOUNTER — Telehealth: Payer: Self-pay

## 2020-11-14 LAB — SARS CORONAVIRUS 2 (TAT 6-24 HRS): SARS Coronavirus 2: NEGATIVE

## 2020-11-14 NOTE — Telephone Encounter (Signed)
Transition Care Management Unsuccessful Follow-up Telephone Call  Date of discharge and from where:  11/13/2020 from [facility]  Attempts:  1st Attempt  Reason for unsuccessful TCM follow-up call:  Unable to reach patient

## 2020-11-15 NOTE — Telephone Encounter (Signed)
Transition Care Management Unsuccessful Follow-up Telephone Call  Date of discharge and from where:  11/13/2020 from [facility]  Attempts:  2nd Attempt  Reason for unsuccessful TCM follow-up call:  Left voice message    

## 2020-11-16 NOTE — Telephone Encounter (Signed)
Transition Care Management Unsuccessful Follow-up Telephone Call  Date of discharge and from where:  11/13/2020-[facility]  Attempts:  3rd Attempt  Reason for unsuccessful TCM follow-up call:  Unable to reach patient

## 2022-03-23 ENCOUNTER — Emergency Department
Admission: EM | Admit: 2022-03-23 | Discharge: 2022-03-23 | Disposition: A | Discharge status: Home / Self Care | Payer: Medicaid Other | Attending: Student in an Organized Health Care Education/Training Program | Admitting: Student in an Organized Health Care Education/Training Program

## 2022-03-23 ENCOUNTER — Other Ambulatory Visit: Payer: Self-pay

## 2022-03-23 ENCOUNTER — Encounter: Payer: Self-pay | Admitting: Emergency Medicine

## 2022-03-23 DIAGNOSIS — K649 Unspecified hemorrhoids: Secondary | ICD-10-CM | POA: Diagnosis not present

## 2022-03-23 DIAGNOSIS — K6289 Other specified diseases of anus and rectum: Secondary | ICD-10-CM | POA: Diagnosis present

## 2022-03-23 MED ORDER — LIDOCAINE-HYDROCORTISONE ACE 3-1 % RE KIT: 1.0000 | PACK | Freq: Two times a day (BID) | RECTAL | 0 refills | Status: DC

## 2022-03-23 MED ORDER — DOCUSATE SODIUM 100 MG PO CAPS: 100.0000 mg | ORAL_CAPSULE | Freq: Every day | ORAL | 0 refills | Status: AC

## 2022-03-23 NOTE — ED Triage Notes (Signed)
Patient to ED via [facility] for rectal pain- possible hemorrhoids. C.o pain for past month.

## 2022-03-23 NOTE — Discharge Instructions (Addendum)
Use the medications as prescribed.  Please follow-up with the outpatient provider for continued management.  Please return for any new, worsening, or change in symptoms or other concerns.  It was a pleasure caring for you today.

## 2022-03-23 NOTE — ED Provider Notes (Signed)
[facility] Provider Note    Event Date/Time   First MD Initiated Contact with Patient 03/23/22 1244     (approximate)   History   Hemorrhoids   HPI  Charles Benitez is a 47 y.o. male who presents today for evaluation of rectal pain and "a bump" for the past 1 month.  He reports that he strains to move his bowels and often sits on the toilet for a while.  He also reports he does heavy lifting.  He denies any pain with sitting.  He denies any bleeding.  No abdominal pain.  No fevers or chills.     Physical Exam   Triage Vital Signs: ED Triage Vitals  Enc Vitals Group     BP 03/23/22 1236 119/77     Pulse Rate 03/23/22 1235 74     Resp 03/23/22 1235 18     Temp 03/23/22 1235 98.7 F (37.1 C)     Temp Source 03/23/22 1235 Oral     SpO2 03/23/22 1235 99 %     Weight 03/23/22 1235 168 lb (76.2 kg)     Height 03/23/22 1235 5\' 5"  (1.651 m)     Head Circumference --      Peak Flow --      Pain Score 03/23/22 1234 5     Pain Loc --      Pain Edu? --      Excl. in [employer]? --     Most recent vital signs: Vitals:   03/23/22 1235 03/23/22 1236  BP:  119/77  Pulse: 74   Resp: 18   Temp: 98.7 F (37.1 C)   SpO2: 99%     Physical Exam Vitals and nursing note reviewed.  Constitutional:      General: Awake and alert. No acute distress.    Appearance: Normal appearance. The patient is normal weight.  HENT:     Head: Normocephalic and atraumatic.     Mouth: Mucous membranes are moist.  Eyes:     General: PERRL. Normal EOMs        Right eye: No discharge.        Left eye: No discharge.     Conjunctiva/sclera: Conjunctivae normal.  Cardiovascular:     Rate and Rhythm: Normal rate and regular rhythm.     Pulses: Normal pulses.     Heart sounds: Normal heart sounds Pulmonary:     Effort: Pulmonary effort is normal. No respiratory distress.     Breath sounds: Normal breath sounds.  Abdominal:     Abdomen is soft. There is no abdominal tenderness.  No rebound or guarding. No distention. Rectum: Small marble sized hemorrhoid noted at the 9 o'clock position that does not appear to be thrombosed.  Minimally tender to palpation.  Normal rectal tone.  No bright red blood per rectum Musculoskeletal:        General: No swelling. Normal range of motion.     Cervical back: Normal range of motion and neck supple.  Skin:    General: Skin is warm and dry.     Capillary Refill: Capillary refill takes less than 2 seconds.     Findings: No rash.  Neurological:     Mental Status: The patient is awake and alert.      ED Results / Procedures / Treatments   Labs (all labs ordered are listed, but only abnormal results are displayed) Labs Reviewed - No data to display   EKG  RADIOLOGY     PROCEDURES:  Critical Care performed:   Procedures   MEDICATIONS ORDERED IN ED: Medications - No data to display   IMPRESSION / MDM / [place] / ED COURSE  I reviewed the triage vital signs and the nursing notes.   Differential diagnosis includes, but is not limited to, hemorrhoids, abscess, fissure.  Patient is awake and alert, hemodynamically stable and afebrile.  He has a small area of swelling and tenderness at the 9 o'clock position, and his history of straining to move his bowels and had sitting on the toilet for a while makes [place] suspicious for a hemorrhoid.  Discussed the option of a possible abscess, though given the duration of his symptoms, unlikely to be abscess.  Do not wish to cut into this in case it is a hemorrhoid.  Patient agrees to try topical anesthetics first.  He was also given a stool softener.  We discussed the importance of not straining.  We discussed return precautions in case this is an abscess, and outpatient follow-up.  Patient and his wife understand and agree with plan.  Patient was discharged in stable condition.   Patient's presentation is most consistent with acute complicated illness / injury  requiring diagnostic workup.    FINAL CLINICAL IMPRESSION(S) / ED DIAGNOSES   Final diagnoses:  Hemorrhoids, unspecified hemorrhoid type     Rx / DC Orders   ED Discharge Orders          Ordered    lidocaine-hydrocortisone (ANAMANTLE) 3-1 % KIT  2 times daily        03/23/22 1323    docusate sodium (COLACE) 100 MG capsule  Daily        03/23/22 1323             Note:  This document was prepared using Dragon voice recognition software and may include unintentional dictation errors.   Emeline Gins 03/23/22 1513    Merlyn Lot, MD 03/23/22 [phone]

## 2022-03-23 NOTE — ED Notes (Signed)
Pt verbalizes understanding of discharge instructions. Opportunity for questioning and answers were provided. Pt discharged from ED to home with family.    

## 2022-04-30 ENCOUNTER — Other Ambulatory Visit: Payer: Self-pay

## 2022-04-30 ENCOUNTER — Emergency Department
Admission: EM | Admit: 2022-04-30 | Discharge: 2022-04-30 | Disposition: A | Discharge status: Home / Self Care | Payer: Medicaid Other | Attending: Emergency Medicine | Admitting: Emergency Medicine

## 2022-04-30 ENCOUNTER — Emergency Department: Payer: Medicaid Other

## 2022-04-30 ENCOUNTER — Encounter: Payer: Self-pay | Admitting: Emergency Medicine

## 2022-04-30 DIAGNOSIS — M25512 Pain in left shoulder: Secondary | ICD-10-CM | POA: Insufficient documentation

## 2022-04-30 DIAGNOSIS — S4992XA Unspecified injury of left shoulder and upper arm, initial encounter: Secondary | ICD-10-CM | POA: Diagnosis not present

## 2022-04-30 MED ORDER — PREDNISONE 10 MG PO TABS: ORAL_TABLET | ORAL | 0 refills | Status: DC

## 2022-04-30 NOTE — ED Triage Notes (Signed)
Patient to ED for left shoulder pain. States hx of injury 20 years ago- re-injured it 3 weeks ago. States unable to life arm. Currently using key lanyard as sling.

## 2022-04-30 NOTE — Discharge Instructions (Signed)
Call make an appoint with Dr. Karel Jarvis who is on-call for orthopedics.  A sling was applied to your arm to be worn only for the next 2 to 3 days maximum as it could cause some stiffness in the joint and make your shoulder pain worse.  Make sure that you continue moving your shoulder to prevent it from getting stiff even with wearing the sling.  A prescription for prednisone was sent to the pharmacy to begin taking today and tapering down by 1 tablet each day.  You may use ice or heat to your shoulder as needed for discomfort.

## 2022-04-30 NOTE — ED Provider Notes (Signed)
[facility] Provider Note    Event Date/Time   First MD Initiated Contact with Patient 04/30/22 [id number]     (approximate)   History   Shoulder Pain   HPI  Charles Benitez is a 47 y.o. male presents to the ED with complaint of left shoulder pain for the last 3 weeks.  Patient denies any direct trauma to his shoulder.  He states that he had problems with his shoulder approximately 20 years ago and [place].  He is not completely sure what he did to his shoulder but denies surgery in the past.  Patient has taken over-the-counter ibuprofen once a day without relief.  Patient has history of degenerative disc disease but denies any other known medical problems.     Physical Exam   Triage Vital Signs: ED Triage Vitals [04/30/22 0948]  Enc Vitals Group     BP 127/80     Pulse Rate 67     Resp 18     Temp 98.5 F (36.9 C)     Temp Source Oral     SpO2 96 %     Weight      Height      Head Circumference      Peak Flow      Pain Score 7     Pain Loc      Pain Edu?      Excl. in [place]?     Most recent vital signs: Vitals:   04/30/22 0948  BP: 127/80  Pulse: 67  Resp: 18  Temp: 98.5 F (36.9 C)  SpO2: 96%     General: Awake, no distress.  CV:  Good peripheral perfusion.  Heart regular rate and rhythm. Resp:  Normal effort.  Clear bilaterally. Abd:  No distention.  Other:  Examination of the shoulder is no gross deformity noted.  There is restriction with range of motion especially with abduction.  No crepitus is appreciated.  There is some tenderness on palpation of the anterior AC joint area.  No soft tissue edema or erythema is noted.  Good muscle strength bilaterally.   ED Results / Procedures / Treatments   Labs (all labs ordered are listed, but only abnormal results are displayed) Labs Reviewed - No data to display    RADIOLOGY Left shoulder x-ray images were reviewed and interpreted by myself independent of the  radiologist and was negative.    PROCEDURES:  Critical Care performed:   Procedures   MEDICATIONS ORDERED IN ED: Medications - No data to display   IMPRESSION / MDM / [place] / ED COURSE  I reviewed the triage vital signs and the nursing notes.   Differential diagnosis includes, but is not limited to, left shoulder pain, shoulder strain, osteoarthritis, tendinitis, bursitis.  47 year old male presents to the ED with complaint of left shoulder pain for approximately 3 weeks.  He is unaware of any known injury to his shoulder.  He states that 20 years ago he did have an injury but did not go back for physical therapy.  X-rays were reassuring and patient was made aware.  A prescription for prednisone was sent to the pharmacy for him to begin taking.  He has to follow-up with Dr. Karel Jarvis who is on-call for orthopedics if he continues to have problems with his shoulder.  Patient was also placed in a sling for comfort measures today that he is aware that this is short-term and should not be worn  more than 2 to 3 days to prevent his shoulder from getting stiff.      Patient's presentation is most consistent with acute complicated illness / injury requiring diagnostic workup.  FINAL CLINICAL IMPRESSION(S) / ED DIAGNOSES   Final diagnoses:  Left shoulder pain, unspecified chronicity     Rx / DC Orders   ED Discharge Orders          Ordered    predniSONE (DELTASONE) 10 MG tablet        04/30/22 1041             Note:  This document was prepared using Dragon voice recognition software and may include unintentional dictation errors.   Johnn Hai, PA-C 04/30/22 1216    Lavonia Drafts, MD 04/30/22 1308

## 2022-07-31 IMAGING — CR DG CHEST 2V
1 series · 2 of 2 positions shown · non-contrast
Comparison: None.

CLINICAL DATA: Shortness of breath.

EXAM:
CHEST - 2 VIEW

[Series 1: dg chest 2 view · 0.14mm/px · 2 of 2 slices shown]
[im 1/2]
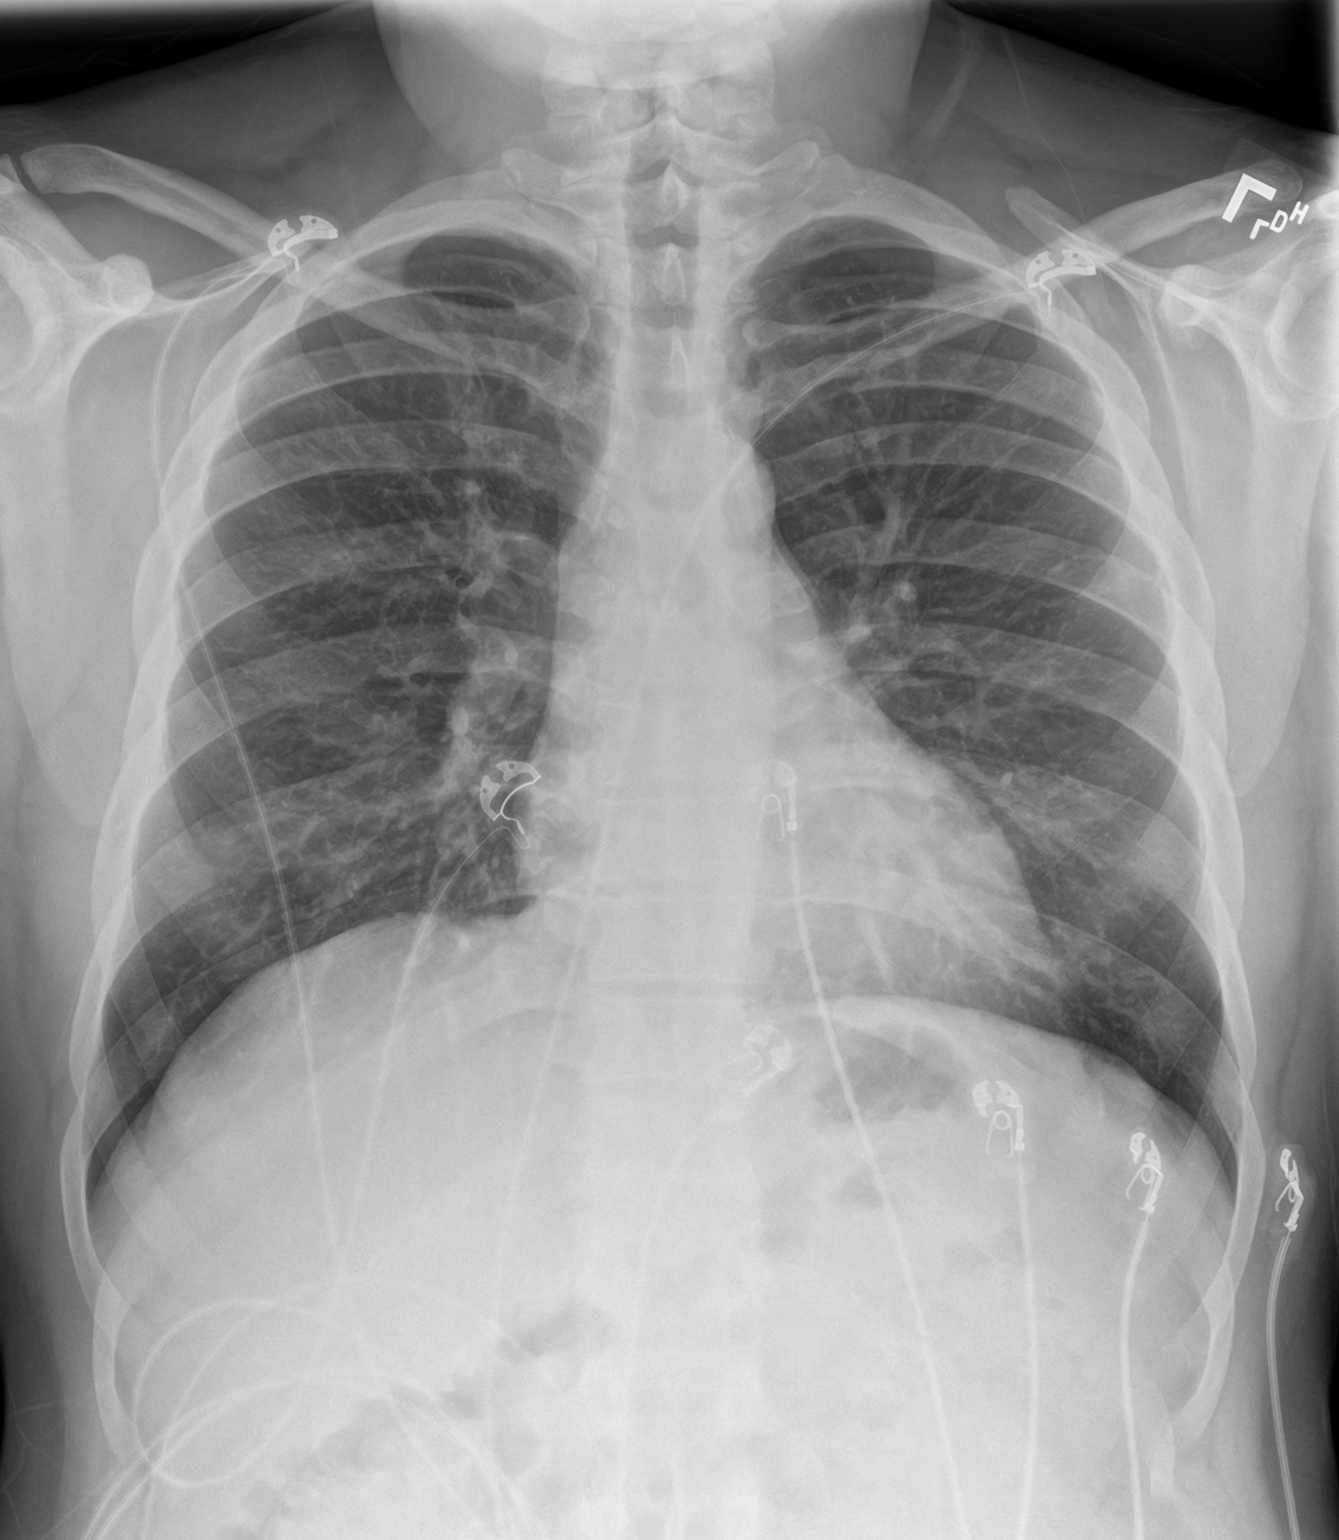
[im 2/2]
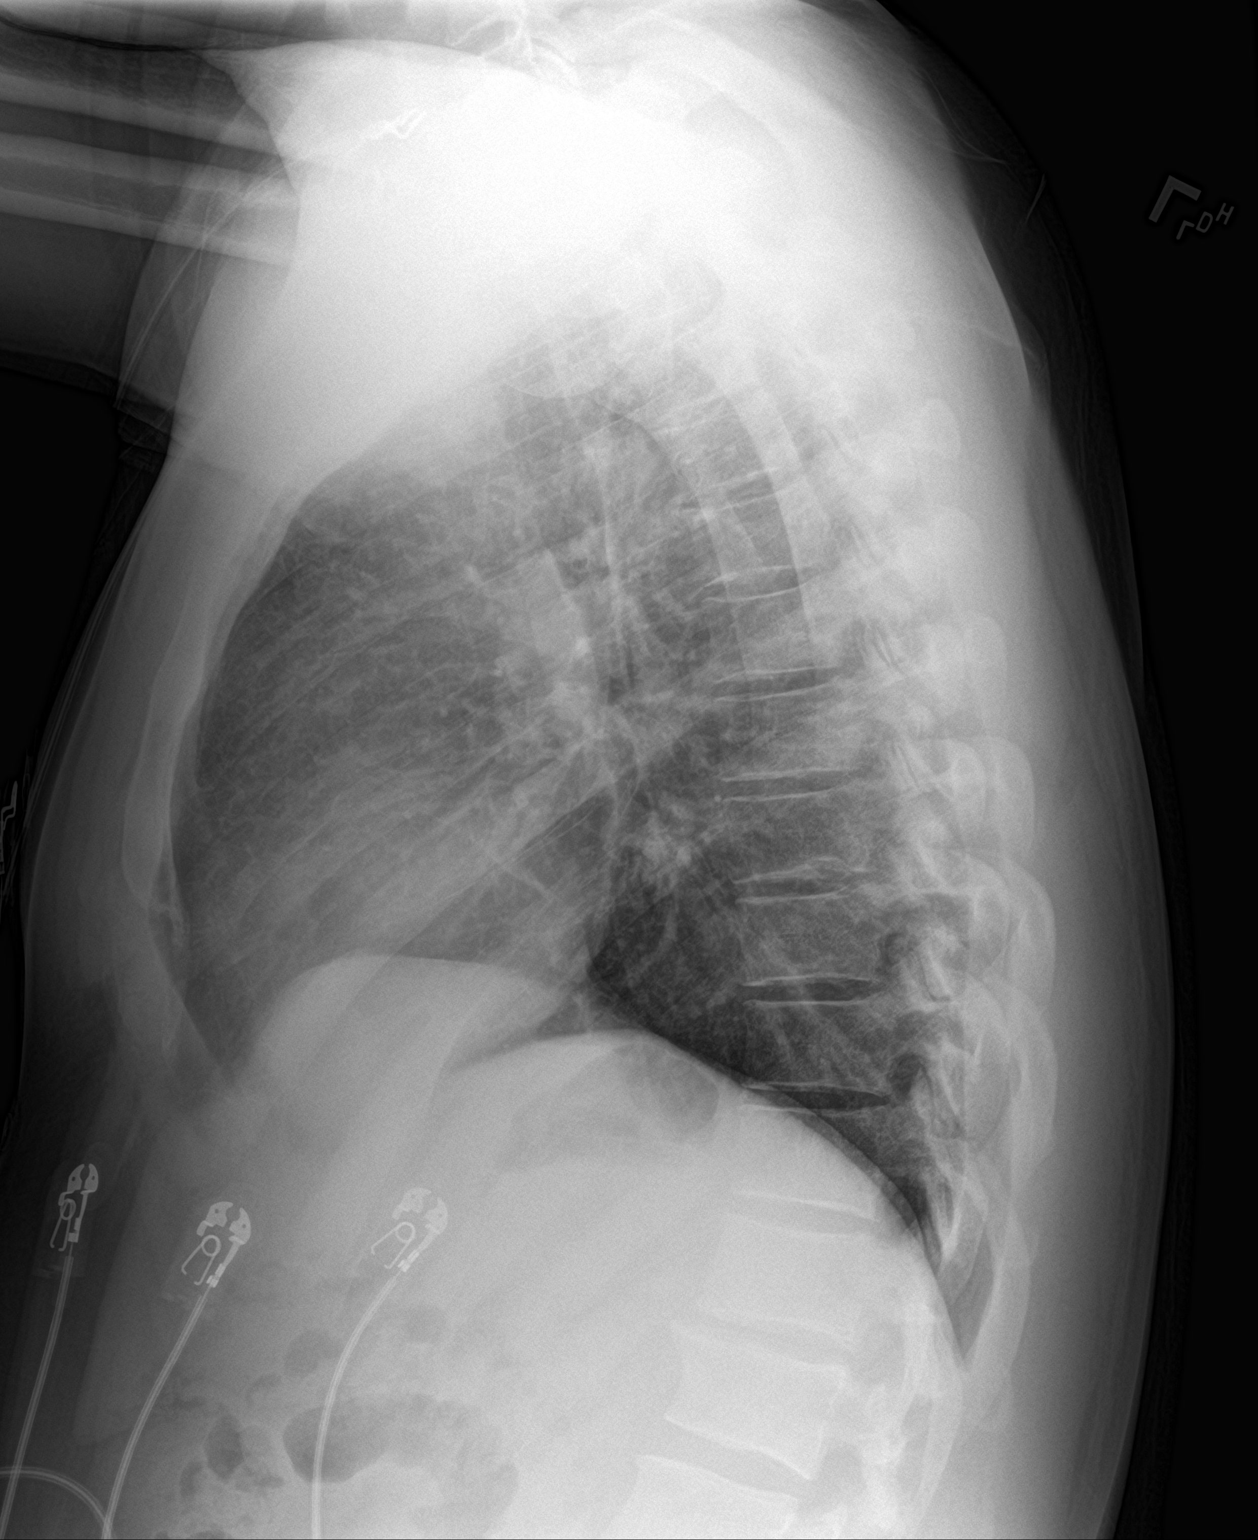

[2 of 2 positions shown; findings below may reference images not displayed]

FINDINGS: The heart size and mediastinal contours are within normal limits.
Both lungs are clear. The visualized skeletal structures are
unremarkable.
IMPRESSION: No active cardiopulmonary disease.

## 2022-08-11 ENCOUNTER — Emergency Department
Admission: EM | Admit: 2022-08-11 | Discharge: 2022-08-11 | Discharge status: Absent without leave | Payer: Medicaid Other | Source: Home / Self Care

## 2023-03-28 ENCOUNTER — Encounter: Payer: Self-pay | Admitting: Emergency Medicine

## 2023-03-28 ENCOUNTER — Other Ambulatory Visit: Payer: Self-pay

## 2023-03-28 ENCOUNTER — Emergency Department
Admission: EM | Admit: 2023-03-28 | Discharge: 2023-03-28 | Discharge status: Against Medical Advice | Payer: Self-pay | Attending: Emergency Medicine | Admitting: Emergency Medicine

## 2023-03-28 DIAGNOSIS — L03012 Cellulitis of left finger: Secondary | ICD-10-CM | POA: Diagnosis not present

## 2023-03-28 DIAGNOSIS — M79645 Pain in left finger(s): Secondary | ICD-10-CM | POA: Insufficient documentation

## 2023-03-28 DIAGNOSIS — Z5321 Procedure and treatment not carried out due to patient leaving prior to being seen by health care provider: Secondary | ICD-10-CM | POA: Insufficient documentation

## 2023-03-28 NOTE — ED Provider Triage Note (Signed)
Emergency Medicine Provider Triage Evaluation Note  Charles Benitez , a 48 y.o. male  was evaluated in triage.  Pt complains of left third finger pain described as a pressure, purulent drainage.  Patient states he is followed and having nail with posterior erythema..  Review of Systems  Positive: Negative:   Physical Exam  There were no vitals taken for this visit. Gen:   Awake, no distress   Resp:  Normal effort  MSK:   Moves extremities without difficulty  Other:  Right third finger presence of pus around the nail  Medical Decision Making  Medically screening exam initiated at 8:20 PM.  Appropriate orders placed.  Charles Benitez was informed that the remainder of the evaluation will be completed by another provider, this initial triage assessment does not replace that evaluation, and the importance of remaining in the ED until their evaluation is complete.  Patient with paronychia requires drainage and antibiotics   Gladys Damme, PA-C 03/28/23 2022

## 2023-03-28 NOTE — ED Triage Notes (Signed)
Patient ambulatory to triage with complaints of left middle digit pain and swelling x3 days. Patient states he bites his nails and pulled a hang nail and now is swollen.

## 2023-05-01 ENCOUNTER — Encounter: Payer: Self-pay | Admitting: Emergency Medicine

## 2023-05-01 ENCOUNTER — Other Ambulatory Visit: Payer: Self-pay

## 2023-05-01 ENCOUNTER — Emergency Department: Payer: Self-pay

## 2023-05-01 ENCOUNTER — Observation Stay
Admission: EM | Admit: 2023-05-01 | Discharge: 2023-05-03 | Disposition: A | Discharge status: Rehabilitation Facility | Payer: Self-pay | Attending: Obstetrics and Gynecology | Admitting: Obstetrics and Gynecology

## 2023-05-01 DIAGNOSIS — Z79899 Other long term (current) drug therapy: Secondary | ICD-10-CM

## 2023-05-01 DIAGNOSIS — Z7982 Long term (current) use of aspirin: Secondary | ICD-10-CM

## 2023-05-01 DIAGNOSIS — H5712 Ocular pain, left eye: Secondary | ICD-10-CM | POA: Diagnosis present

## 2023-05-01 DIAGNOSIS — Z8249 Family history of ischemic heart disease and other diseases of the circulatory system: Secondary | ICD-10-CM

## 2023-05-01 DIAGNOSIS — Z91013 Allergy to seafood: Secondary | ICD-10-CM

## 2023-05-01 DIAGNOSIS — R29701 NIHSS score 1: Secondary | ICD-10-CM | POA: Diagnosis not present

## 2023-05-01 DIAGNOSIS — R278 Other lack of coordination: Secondary | ICD-10-CM | POA: Diagnosis present

## 2023-05-01 DIAGNOSIS — R202 Paresthesia of skin: Secondary | ICD-10-CM | POA: Diagnosis not present

## 2023-05-01 DIAGNOSIS — Z7902 Long term (current) use of antithrombotics/antiplatelets: Secondary | ICD-10-CM | POA: Diagnosis not present

## 2023-05-01 DIAGNOSIS — Z634 Disappearance and death of family member: Secondary | ICD-10-CM

## 2023-05-01 DIAGNOSIS — R262 Difficulty in walking, not elsewhere classified: Secondary | ICD-10-CM | POA: Diagnosis present

## 2023-05-01 DIAGNOSIS — R200 Anesthesia of skin: Secondary | ICD-10-CM

## 2023-05-01 DIAGNOSIS — M5442 Lumbago with sciatica, left side: Secondary | ICD-10-CM | POA: Diagnosis not present

## 2023-05-01 DIAGNOSIS — Z823 Family history of stroke: Secondary | ICD-10-CM

## 2023-05-01 DIAGNOSIS — I639 Cerebral infarction, unspecified: Secondary | ICD-10-CM | POA: Diagnosis not present

## 2023-05-01 DIAGNOSIS — F17210 Nicotine dependence, cigarettes, uncomplicated: Secondary | ICD-10-CM | POA: Diagnosis present

## 2023-05-01 DIAGNOSIS — R270 Ataxia, unspecified: Secondary | ICD-10-CM | POA: Insufficient documentation

## 2023-05-01 DIAGNOSIS — R29700 NIHSS score 0: Secondary | ICD-10-CM | POA: Diagnosis present

## 2023-05-01 DIAGNOSIS — G8929 Other chronic pain: Secondary | ICD-10-CM | POA: Diagnosis present

## 2023-05-01 DIAGNOSIS — M5441 Lumbago with sciatica, right side: Secondary | ICD-10-CM | POA: Diagnosis not present

## 2023-05-01 DIAGNOSIS — J45909 Unspecified asthma, uncomplicated: Secondary | ICD-10-CM | POA: Diagnosis present

## 2023-05-01 DIAGNOSIS — I63542 Cerebral infarction due to unspecified occlusion or stenosis of left cerebellar artery: Principal | ICD-10-CM | POA: Diagnosis present

## 2023-05-01 LAB — CBC
HCT: 49.9 % (ref 39.0–52.0)
Hemoglobin: 16.7 g/dL (ref 13.0–17.0)
MCH: 30.6 pg (ref 26.0–34.0)
MCHC: 33.5 g/dL (ref 30.0–36.0)
MCV: 91.4 fL (ref 80.0–100.0)
Platelets: 341 10*3/uL (ref 150–400)
RBC: 5.46 MIL/uL (ref 4.22–5.81)
RDW: 13.5 % (ref 11.5–15.5)
WBC: 13.4 10*3/uL — ABNORMAL HIGH (ref 4.0–10.5)
nRBC: 0 % (ref 0.0–0.2)

## 2023-05-01 LAB — DIFFERENTIAL
Abs Immature Granulocytes: 0.07 K/uL (ref 0.00–0.07)
Basophils Absolute: 0.1 K/uL (ref 0.0–0.1)
Basophils Relative: 1 %
Eosinophils Absolute: 0.1 K/uL (ref 0.0–0.5)
Eosinophils Relative: 1 %
Immature Granulocytes: 1 %
Lymphocytes Relative: 15 %
Lymphs Abs: 2 K/uL (ref 0.7–4.0)
Monocytes Absolute: 0.8 K/uL (ref 0.1–1.0)
Monocytes Relative: 6 %
Neutro Abs: 10.4 K/uL — ABNORMAL HIGH (ref 1.7–7.7)
Neutrophils Relative %: 76 %

## 2023-05-01 LAB — COMPREHENSIVE METABOLIC PANEL
ALT: 23 U/L (ref 0–44)
AST: 16 U/L (ref 15–41)
Albumin: 3.3 g/dL — ABNORMAL LOW (ref 3.5–5.0)
Alkaline Phosphatase: 72 U/L (ref 38–126)
Anion gap: 6 (ref 5–15)
BUN: 14 mg/dL (ref 6–20)
CO2: 25 mmol/L (ref 22–32)
Calcium: 8.6 mg/dL — ABNORMAL LOW (ref 8.9–10.3)
Chloride: 107 mmol/L (ref 98–111)
Creatinine, Ser: 0.71 mg/dL (ref 0.61–1.24)
GFR, Estimated: >60 mL/min (ref >60)
Glucose, Bld: 133 mg/dL — ABNORMAL HIGH (ref 70–99)
Potassium: 4.3 mmol/L (ref 3.5–5.1)
Sodium: 138 mmol/L (ref 135–145)
Total Bilirubin: 0.3 mg/dL (ref 0.0–1.2)
Total Protein: 6.3 g/dL — ABNORMAL LOW (ref 6.5–8.1)

## 2023-05-01 LAB — APTT: aPTT: 24 s (ref 24–36)

## 2023-05-01 LAB — ETHANOL: Alcohol, Ethyl (B): <10 mg/dL (ref <10)

## 2023-05-01 LAB — PROTIME-INR
INR: 1 (ref 0.8–1.2)
Prothrombin Time: 13.2 s (ref 11.4–15.2)

## 2023-05-01 LAB — CBG MONITORING, ED: Glucose-Capillary: 84 mg/dL (ref 70–99)

## 2023-05-01 MED ORDER — SODIUM CHLORIDE 0.9 % IV BOLUS
1000.0000 mL | Freq: Once | INTRAVENOUS | Status: AC
  Administered 2023-05-02: 1000 mL via INTRAVENOUS

## 2023-05-01 MED ORDER — MECLIZINE HCL 25 MG PO TABS
25.0000 mg | ORAL_TABLET | Freq: Once | ORAL | Status: AC
  Administered 2023-05-02: 25 mg via ORAL
  Filled 2023-05-01: qty 1

## 2023-05-01 MED ORDER — ACETAMINOPHEN 500 MG PO TABS
1000.0000 mg | ORAL_TABLET | Freq: Once | ORAL | Status: AC
  Administered 2023-05-02: 1000 mg via ORAL
  Filled 2023-05-01: qty 2

## 2023-05-01 MED ORDER — GADOBUTROL 1 MMOL/ML IV SOLN
8.0000 mL | Freq: Once | INTRAVENOUS | Status: AC | PRN
Start: 2023-05-01 — End: 2023-05-01
  Administered 2023-05-01: 8 mL via INTRAVENOUS

## 2023-05-01 NOTE — Consult Note (Signed)
 [facility]  Stat Consult  Patient Name:   Charles Benitez, Charles Benitez Date of Birth:   [birth year] Identification Number:   MRN - [id number] Date of Service:   05/01/2023 20:55:15  Diagnosis:       R26.81 - Unsteady gait  Impression This is a 48 year old man with history of chronic lower back pain and sciatica (reportedly bilateral) here with unsteady gait, neurology consulting. Given associated left facial numbness- I would like to get an MRI brain; a posterior circulation stroke may not be visible on CT head. Given bilateral arm tingling/numbness- I would like to get an MR cervical spine.   Recommendations: Our recommendations are outlined below.  Dispositions : Neurology will follow  Miscellaneous : MRI brain/ MRA head and neck MRI cervical spine Further management pend test results and clinical course    ----------------------------------------------------------------------------------------------------    Metrics: Dispatch Time: 05/01/2023 20:51:46 Callback Response Time: 05/01/2023 20:57:55  Primary Provider Notified of Diagnostic Impression and Management Plan on: 05/01/2023 21:46:35     ----------------------------------------------------------------------------------------------------  Chief Complaint: trouble walking  History of Present Illness: Patient is a 48 year old Male. This is a 48 year old man with history of chronic lower back pain and sciatica (reportedly bilateral) here with unsteady gait, neurology consulting. About a week prior to presentation he noticed a "lump" next to his left eyeball. This was not tender to touch and has not been growing or changing. At about 2:30 pm on the day of presentation he had a difficult-to-describe "cold rush" characterized by a sweaty feeling, without actual sweating, and a light headed feeling. He stated that since then- he has had a tendency to fall towards the left. He has  also had a "tired" feeling in his left eye and numbness/tingling on the left side of his face. He has had numbness/tingling in both arms as well; although this is worse on the left.   Past Medical History: Other PMH:  history of chronic lower back pain and sciatica (reportedly bilateral)  Medications:  No Anticoagulant use  No Antiplatelet use Reviewed EMR for current medications  Allergies:  Reviewed  Social History: Drug Use: No  Family History:  There is no family history of premature cerebrovascular disease pertinent to this consultation  ROS : 14 Points Review of Systems was performed and was negative except mentioned in HPI.  Past Surgical History: There Is No Surgical History Contributory To Today's Visit   Examination: BP(126/75), Pulse(60), 1A: Level of Consciousness - Alert; keenly responsive + 0 1B: Ask Month and Age - Both Questions Right + 0 1C: Blink Eyes & Squeeze Hands - Performs Both Tasks + 0 2: Test Horizontal Extraocular Movements - Normal + 0 3: Test Visual Fields - No Visual Loss + 0 4: Test Facial Palsy (Use Grimace if Obtunded) - Normal symmetry + 0 5A: Test Left Arm Motor Drift - No Drift for 10 Seconds + 0 5B: Test Right Arm Motor Drift - No Drift for 10 Seconds + 0 6A: Test Left Leg Motor Drift - No Drift for 5 Seconds + 0 6B: Test Right Leg Motor Drift - No Drift for 5 Seconds + 0 7: Test Limb Ataxia (FNF/Heel-Shin) - No Ataxia + 0 8: Test Sensation - Mild-Moderate Loss: Less Sharp/More Dull + 1 9: Test Language/Aphasia - Normal; No aphasia + 0 10: Test Dysarthria - Normal + 0 11: Test Extinction/Inattention - No abnormality + 0  NIHSS Score: 1  Spoke with : Dr. Vicente Males  This consult was conducted in real time using interactive audio and Immunologist. Patient was informed of the technology being used for this visit and agreed to proceed. Patient located in hospital and provider located at home/office setting.  Patient is being  evaluated for possible acute neurologic impairment and high probability of imminent or life - threatening deterioration.I spent total of 35 minutes providing care to this patient, including time for face to face visit via telemedicine, review of medical records, imaging studies and discussion of findings with providers, the patient and / or family.   Dr Rosalita Chessman   [facility] For Inpatient follow-up with [facility] physician please call [facility] at [phone]. As we are not an outpatient service for any post hospital discharge needs please contact the hospital for assistance.  If you have any questions for the [facility] physicians or need to reconsult for clinical or diagnostic changes please contact us via [facility] at [phone].

## 2023-05-01 NOTE — ED Provider Notes (Signed)
 [facility] Provider Note   Event Date/Time   First MD Initiated Contact with Patient 05/01/23 1849     (approximate) History  Numbness  HPI Charles Benitez is a 48 y.o. male with history of chronic low back pain with bilateral sciatica who presents complaining of left-sided headache with pressure behind the left eye, nausea, left facial numbness, right arm numbness, and bilateral hand paresthesias.  Patient is also unable to walk due to being unsteady on his feet.  Patient denies any fevers, recent travel, sick contacts ROS: Patient currently denies any vision changes, tinnitus, difficulty speaking, facial droop, sore throat, chest pain, shortness of breath, abdominal pain, nausea/vomiting/diarrhea, dysuria   Physical Exam  Triage Vital Signs: ED Triage Vitals  Encounter Vitals Group     BP 05/01/23 1717 103/82     Systolic BP Percentile --      Diastolic BP Percentile --      Pulse Rate 05/01/23 1717 68     Resp 05/01/23 1717 18     Temp 05/01/23 1717 98.6 F (37 C)     Temp Source 05/01/23 1717 Oral     SpO2 05/01/23 1717 96 %     Weight 05/01/23 1716 177 lb (80.3 kg)     Height 05/01/23 1716 5\' 7"  (1.702 m)     Head Circumference --      Peak Flow --      Pain Score 05/01/23 1714 9     Pain Loc --      Pain Education --      Exclude from Growth Chart --    Most recent vital signs: Vitals:   05/01/23 2130 05/01/23 2158  BP: 131/78   Pulse: (!) 58   Resp: 13   Temp:  98.3 F (36.8 C)  SpO2: 99%    General: Awake, oriented x4. CV:  Good peripheral perfusion.  Resp:  Normal effort.  Abd:  No distention.  Other:  Middle-aged overweight Hispanic male resting comfortably no acute distress.  Patient is extremely ataxic.  Patient has mild conjunctivitis of the left eye ED Results / Procedures / Treatments  Labs (all labs ordered are listed, but only abnormal results are displayed) Labs Reviewed  CBC - Abnormal; Notable for the following  components:      Result Value   WBC 13.4 (*)    All other components within normal limits  DIFFERENTIAL - Abnormal; Notable for the following components:   Neutro Abs 10.4 (*)    All other components within normal limits  COMPREHENSIVE METABOLIC PANEL - Abnormal; Notable for the following components:   Glucose, Bld 133 (*)    Calcium 8.6 (*)    Total Protein 6.3 (*)    Albumin 3.3 (*)    All other components within normal limits  PROTIME-INR  APTT  ETHANOL  CBG MONITORING, ED  CBG MONITORING, ED   EKG ED ECG REPORT I, Merwyn Katos, the attending physician, personally viewed and interpreted this ECG. Date: 05/01/2023 EKG Time: 1711 Rate: 66 Rhythm: normal sinus rhythm QRS Axis: normal Intervals: normal ST/T Wave abnormalities: normal Narrative Interpretation: no evidence of acute ischemia RADIOLOGY ED MD interpretation: CT of the head without contrast interpreted by me shows no evidence of acute abnormalities including no intracerebral hemorrhage, obvious masses, or significant edema -Agree with radiology assessment Official radiology report(s): CT HEAD WO CONTRAST Result Date: 05/01/2023 CLINICAL DATA:  Headache and pressure behind the left eye with nausea, left-sided facial numbness and  right arm numbness. EXAM: CT HEAD WITHOUT CONTRAST TECHNIQUE: Contiguous axial images were obtained from the base of the skull through the vertex without intravenous contrast. RADIATION DOSE REDUCTION: This exam was performed according to the departmental dose-optimization program which includes automated exposure control, adjustment of the mA and/or kV according to patient size and/or use of iterative reconstruction technique. COMPARISON:  None Available. FINDINGS: Brain: No evidence of acute infarction, hemorrhage, hydrocephalus, extra-axial collection or mass lesion/mass effect. Vascular: No hyperdense vessel or unexpected calcification. Skull: Normal. Negative for fracture or focal lesion.  Sinuses/Orbits: No acute finding. Other: None. IMPRESSION: No acute intracranial pathology. Electronically Signed   By: Aram Candela M.D.   On: 05/01/2023 18:51   PROCEDURES: Critical Care performed: No Procedures MEDICATIONS ORDERED IN ED: Medications  sodium chloride 0.9 % bolus 1,000 mL (has no administration in time range)  acetaminophen (TYLENOL) tablet 1,000 mg (has no administration in time range)   IMPRESSION / MDM / ASSESSMENT AND PLAN / ED COURSE  I reviewed the triage vital signs and the nursing notes.                             The patient is on the cardiac monitor to evaluate for evidence of arrhythmia and/or significant heart rate changes. Patient's presentation is most consistent with acute presentation with potential threat to life or bodily function.  This patient presents to the ED for concern of left-sided headache, left facial numbness, bilateral upper extremity paresthesias, and ataxia, this involves an extensive number of treatment options, and is a complaint that carries with it a high risk of complications and morbidity.  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  The pertinent results include: Leukocytosis to 13.4, calcium 8.6, protein 6.3, albumin 3.3 Imaging Studies ordered:  I ordered imaging studies including head CT, MR of the head and neck, MR angiography of the head and neck  I independently visualized and interpreted imaging which showed head CT done prior to my end of shift and shows no acute abnormalities  I agree with the radiologist interpretation Cardiac Monitoring: / EKG:  The patient was maintained  on a cardiac monitor.  I personally viewed and interpreted the cardiac monitored which showed an underlying rhythm of: Normal sinus rhythm Consultations Obtained:  I requested consultation with the neurology,  and discussed lab and imaging findings as well as pertinent plan - they recommend: MR of the head and neck as well as MR angiography of the head and neck Problem List / ED Course / Critical interventions / Medication management  Left-sided eye pain, left-sided headache, left-sided facial numbness, bilateral upper extremity paresthesias, ataxia  Dispo: Care of this patient will be signed out to the oncoming physician at the end of my shift.  All pertinent patient information conveyed and all questions answered.  All further care and disposition decisions will be made by the oncoming physician.       FINAL CLINICAL IMPRESSION(S) / ED DIAGNOSES   Final diagnoses:  Left facial numbness  Left eye pain  Paresthesias in right hand  Paresthesias in left hand  Ataxia   Rx / DC Orders   ED Discharge Orders  None      Note:  This document was prepared using [employer] voice recognition software and may include unintentional dictation errors.   Merwyn Katos, MD 05/01/23 [phone]

## 2023-05-01 NOTE — ED Triage Notes (Addendum)
 Pt via POV from home. Pt that 1430 today pt had headache and pressure behind his L eye, nausea, L sided facial numbness, R arm numbness, and bilateral hand numbness. Denies any numbness in his legs. Denies any weakness. Denies hx of migraines. States he has never had anything like this before. Pt is A&OX4 and NAD

## 2023-05-01 NOTE — ED Notes (Signed)
 Patient transported to MRI

## 2023-05-01 NOTE — ED Notes (Signed)
 Pt in MRI at this time

## 2023-05-01 NOTE — ED Notes (Signed)
 Called [facility] per Dr. Vicente Males spoke with Philbert Riser said she would have nurse call back /call was returned to Dr. Vicente Males

## 2023-05-02 ENCOUNTER — Observation Stay (HOSPITAL_BASED_OUTPATIENT_CLINIC_OR_DEPARTMENT_OTHER)
Admit: 2023-05-02 | Discharge: 2023-05-02 | Disposition: A | Discharge status: Home / Self Care | Payer: Self-pay | Attending: Obstetrics and Gynecology | Admitting: Obstetrics and Gynecology

## 2023-05-02 DIAGNOSIS — I6389 Other cerebral infarction: Secondary | ICD-10-CM

## 2023-05-02 DIAGNOSIS — I639 Cerebral infarction, unspecified: Principal | ICD-10-CM | POA: Diagnosis present

## 2023-05-02 DIAGNOSIS — Z8249 Family history of ischemic heart disease and other diseases of the circulatory system: Secondary | ICD-10-CM

## 2023-05-02 LAB — HEMOGLOBIN A1C
Hgb A1c MFr Bld: 5.3 % (ref 4.8–5.6)
Mean Plasma Glucose: 105.41 mg/dL

## 2023-05-02 LAB — ECHOCARDIOGRAM COMPLETE BUBBLE STUDY
AR max vel: 2.99 cm2
AV Area VTI: 2.79 cm2
AV Area mean vel: 2.73 cm2
AV Mean grad: 4 mmHg
AV Peak grad: 8.4 mmHg
Ao pk vel: 1.45 m/s
Area-P 1/2: 4.04 cm2
Calc EF: 68.8 %
MV VTI: 3.06 cm2
S' Lateral: 2.8 cm
Single Plane A2C EF: 76.7 %
Single Plane A4C EF: 61.9 %

## 2023-05-02 LAB — HIV ANTIBODY (ROUTINE TESTING W REFLEX): HIV Screen 4th Generation wRfx: NONREACTIVE

## 2023-05-02 LAB — PLATELET COUNT: Platelets: 294 10*3/uL (ref 150–400)

## 2023-05-02 LAB — LIPID PANEL
Cholesterol: 139 mg/dL (ref 0–200)
HDL: 24 mg/dL — ABNORMAL LOW (ref >40)
LDL Cholesterol: 67 mg/dL (ref 0–99)
Total CHOL/HDL Ratio: 5.8 ratio
Triglycerides: 242 mg/dL — ABNORMAL HIGH (ref <150)
VLDL: 48 mg/dL — ABNORMAL HIGH (ref 0–40)

## 2023-05-02 LAB — HEMOGLOBIN AND HEMATOCRIT, BLOOD
HCT: 45.1 % (ref 39.0–52.0)
Hemoglobin: 15.4 g/dL (ref 13.0–17.0)

## 2023-05-02 LAB — FIBRINOGEN: Fibrinogen: 300 mg/dL (ref 210–475)

## 2023-05-02 LAB — APTT: aPTT: 27 s (ref 24–36)

## 2023-05-02 MED ORDER — STROKE: EARLY STAGES OF RECOVERY BOOK: Freq: Once | Status: AC

## 2023-05-02 MED ORDER — ACETAMINOPHEN 650 MG RE SUPP: 650.0000 mg | RECTAL | Status: DC | PRN

## 2023-05-02 MED ORDER — ACETAMINOPHEN 160 MG/5ML PO SOLN: 650.0000 mg | ORAL | Status: DC | PRN

## 2023-05-02 MED ORDER — GABAPENTIN 100 MG PO CAPS
100.0000 mg | ORAL_CAPSULE | Freq: Every day | ORAL | Status: DC
  Administered 2023-05-02: 100 mg via ORAL
  Filled 2023-05-02: qty 1

## 2023-05-02 MED ORDER — CYCLOBENZAPRINE HCL 10 MG PO TABS
10.0000 mg | ORAL_TABLET | Freq: Three times a day (TID) | ORAL | Status: DC | PRN
Start: 2023-05-02 — End: 2023-05-03

## 2023-05-02 MED ORDER — ASPIRIN 81 MG PO CHEW
324.0000 mg | CHEWABLE_TABLET | Freq: Once | ORAL | Status: AC
  Administered 2023-05-02: 324 mg via ORAL
  Filled 2023-05-02: qty 4

## 2023-05-02 MED ORDER — ENOXAPARIN SODIUM 40 MG/0.4ML IJ SOSY
40.0000 mg | PREFILLED_SYRINGE | INTRAMUSCULAR | Status: DC
  Administered 2023-05-02 – 2023-05-03 (×2): 40 mg via SUBCUTANEOUS
  Filled 2023-05-02 (×2): qty 0.4

## 2023-05-02 MED ORDER — ACETAMINOPHEN 325 MG PO TABS: 650.0000 mg | ORAL_TABLET | ORAL | Status: DC | PRN

## 2023-05-02 MED ORDER — ASPIRIN 81 MG PO TBEC
81.0000 mg | DELAYED_RELEASE_TABLET | Freq: Every day | ORAL | Status: DC
  Administered 2023-05-03: 81 mg via ORAL
  Filled 2023-05-02 (×2): qty 1

## 2023-05-02 NOTE — H&P (Addendum)
 History and Physical    Patient: Charles Benitez ZOX:[id number] DOB: [birth year] DOA: 05/01/2023 DOS: the patient was seen and examined on 05/02/2023 PCP: Smitty Cords, DO  Patient coming from: Home  Chief Complaint:  Chief Complaint  Patient presents with   Numbness    HPI: Charles Benitez is a 48 y.o. male with medical history significant for Chronic low back pain with sciatica and family history of premature CAD and stroke with no other significant past medical history who presented to the ED with left-sided headache with pain behind left eye, left facial numbness, paresthesia of both hands and unsteady gait, with onset of symptoms at 2:30 PM on 05/01/2023.  He was previously in his usual state of health.  He arrived to the ED after 5 PM just outside tPA window.  Symptoms have improved since arrival. ED course and data review: Mildly bradycardic to the high 50s with otherwise normal vitals..  EKG, personally viewed and interpreted showing NSR at 66 with no acute ST-T wave changes. Head CT showed no acute findings  Patient was evaluated by teleneurology with recommendation for several MRI studies.  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  There was no LVO and MRA neck without hemodynamically significant stenosis  Patient treated with full dose aspirin and also given a dose of meclizine and NS bolus  Hospitalist consulted for admission for stroke workup Labs with mild leukocytosis of 13,000 otherwise mostly unremarkable   Review of Systems: As mentioned in the history of present illness. All other systems reviewed and are negative.  Past Medical History:  Diagnosis Date   Asthma    Chronic back pain    History reviewed. No pertinent surgical history. Social History:  reports that he has been smoking cigarettes. He has never used smokeless tobacco. He reports  current alcohol use. He reports current drug use. Drug: Marijuana.  Allergies  Allergen Reactions   Shrimp [Shellfish Allergy] Anaphylaxis    History reviewed. No pertinent family history.  Prior to Admission medications   Medication Sig Start Date End Date Taking? Authorizing Provider  albuterol (PROVENTIL) (2.5 MG/3ML) 0.083% nebulizer solution Take 3 mLs (2.5 mg total) by nebulization every 4 (four) hours as needed for wheezing or shortness of breath. 11/13/20 11/13/21  Jene Every, MD  cyclobenzaprine (FLEXERIL) 10 MG tablet Take 1 tablet (10 mg total) by mouth 3 (three) times daily as needed for muscle spasms. 03/18/20   Karamalegos, Netta Neat, DO  gabapentin (NEURONTIN) 100 MG capsule Take 1 capsule (100 mg total) by mouth at bedtime. 05/06/20   Karamalegos, Netta Neat, DO  lidocaine-hydrocortisone (ANAMANTLE) 3-1 % KIT Place 1 Application rectally 2 (two) times daily. 03/23/22   Poggi, Herb Grays, PA-C  predniSONE (DELTASONE) 10 MG tablet Take 6 tablets  today, on day 2 take 5 tablets, day 3 take 4 tablets, day 4 take 3 tablets, day 5 take  2 tablets and 1 tablet the last day 04/30/22   Tommi Rumps, PA-C    Physical Exam: Vitals:   05/01/23 2030 05/01/23 2130 05/01/23 2158 05/02/23 0030  BP: 126/75 131/78  (!) 127/99  Pulse: 60 (!) 58  (!) 58  Resp: 15 13    Temp:   98.3 F (36.8 C)   TempSrc:   Oral   SpO2: 99% 99%  100%  Weight:      Height:  Physical Exam Vitals and nursing note reviewed.  Constitutional:      General: He is not in acute distress. HENT:     Head: Normocephalic and atraumatic.  Cardiovascular:     Rate and Rhythm: Regular rhythm. Bradycardia present.     Heart sounds: Normal heart sounds.  Pulmonary:     Effort: Pulmonary effort is normal.     Breath sounds: Normal breath sounds.  Abdominal:     Palpations: Abdomen is soft.     Tenderness: There is no abdominal tenderness.  Neurological:     General: No focal deficit present.     Mental  Status: Mental status is at baseline.     Labs on Admission: I have personally reviewed following labs and imaging studies  CBC: Recent Labs  Lab 05/01/23 1715  WBC 13.4*  NEUTROABS 10.4*  HGB 16.7  HCT 49.9  MCV 91.4  PLT 341   Basic Metabolic Panel: Recent Labs  Lab 05/01/23 1715  NA 138  K 4.3  CL 107  CO2 25  GLUCOSE 133*  BUN 14  CREATININE 0.71  CALCIUM 8.6*   GFR: Estimated Creatinine Clearance: 115.9 mL/min (by C-G formula based on SCr of 0.71 mg/dL). Liver Function Tests: Recent Labs  Lab 05/01/23 1715  AST 16  ALT 23  ALKPHOS 72  BILITOT 0.3  PROT 6.3*  ALBUMIN 3.3*   No results for input(s): "LIPASE", "AMYLASE" in the last 168 hours. No results for input(s): "AMMONIA" in the last 168 hours. Coagulation Profile: Recent Labs  Lab 05/01/23 1715  INR 1.0   Cardiac Enzymes: No results for input(s): "CKTOTAL", "CKMB", "CKMBINDEX", "TROPONINI" in the last 168 hours. BNP (last 3 results) No results for input(s): "PROBNP" in the last 8760 hours. HbA1C: No results for input(s): "HGBA1C" in the last 72 hours. CBG: Recent Labs  Lab 05/01/23 1715  GLUCAP 84   Lipid Profile: No results for input(s): "CHOL", "HDL", "LDLCALC", "TRIG", "CHOLHDL", "LDLDIRECT" in the last 72 hours. Thyroid Function Tests: No results for input(s): "TSH", "T4TOTAL", "FREET4", "T3FREE", "THYROIDAB" in the last 72 hours. Anemia Panel: No results for input(s): "VITAMINB12", "FOLATE", "FERRITIN", "TIBC", "IRON", "RETICCTPCT" in the last 72 hours. Urine analysis: No results found for: "COLORURINE", "APPEARANCEUR", "LABSPEC", "PHURINE", "GLUCOSEU", "HGBUR", "BILIRUBINUR", "KETONESUR", "PROTEINUR", "UROBILINOGEN", "NITRITE", "LEUKOCYTESUR"  Radiological Exams on Admission: MR Cervical Spine Wo Contrast Result Date: 05/02/2023 CLINICAL DATA:  Initial evaluation for acute ataxia. EXAM: MRI CERVICAL SPINE WITHOUT CONTRAST TECHNIQUE: Multiplanar, multisequence MR imaging of the  cervical spine was performed. No intravenous contrast was administered. COMPARISON:  Prior radiograph from 10/26/2015. FINDINGS: Alignment: Straightening of the normal cervical lordosis. No listhesis. Vertebrae: Vertebral body height maintained without acute or chronic fracture. Decreased T1 signal intensity noted throughout the visualized bone marrow, nonspecific, but most commonly related to anemia, smoking or obesity. No discrete or worrisome osseous lesions. No abnormal marrow edema. Cord: Normal signal and morphology. Posterior Fossa, vertebral arteries, paraspinal tissues: Unremarkable. Disc levels: C2-C3: Unremarkable. C3-C4: Disc desiccation with mild disc bulge. Mild bilateral uncovertebral spurring. No spinal stenosis. Foramina remain patent. C4-C5: Mild disc bulge with uncovertebral spurring. No spinal stenosis. Mild left C5 foraminal narrowing. Right neural foramen remains patent. C5-C6: Mild disc bulge with uncovertebral spurring. Flattening and partial effacement of the ventral thecal sac with resultant mild spinal stenosis. Moderate left with mild right C6 foraminal narrowing. C6-C7: Left eccentric disc bulge with left-sided uncovertebral spurring. No significant spinal stenosis. Mild left C7 foraminal narrowing. Right neural foramina remains patent. C7-T1: Negative  interspace. Mild right-sided facet hypertrophy. No canal or foraminal stenosis. IMPRESSION: 1. Normal MRI appearance of the cervical spinal cord. 2. Mild multilevel cervical spondylosis with resultant mild spinal stenosis at C5-6. 3. Mild left C5, moderate left C6, and mild left C7 foraminal narrowing related to disc bulge and uncovertebral disease. Electronically Signed   By: Rise Mu M.D.   On: 05/02/2023 00:38   MR BRAIN WO CONTRAST Result Date: 05/02/2023 CLINICAL DATA:  Initial evaluation for acute headache, neuro deficit. EXAM: MRI HEAD WITHOUT CONTRAST MRA HEAD WITHOUT CONTRAST MRA NECK WITHOUT AND WITH CONTRAST  TECHNIQUE: Multiplanar, multi-echo pulse sequences of the brain and surrounding structures were acquired without intravenous contrast. Angiographic images of the Circle of Willis were acquired using MRA technique without intravenous contrast. Angiographic images of the neck were acquired using MRA technique without and with intravenous contrast. Carotid stenosis measurements (when applicable) are obtained utilizing NASCET criteria, using the distal internal carotid diameter as the denominator. CONTRAST:  8mL GADAVIST GADOBUTROL 1 MMOL/ML IV SOLN COMPARISON:  None Available. FINDINGS: MRI HEAD FINDINGS Brain: Cerebral volume within normal limits. No significant cerebral white matter disease for age. No other focal parenchymal signal abnormality. 6 mm focus of restricted diffusion seen involving the lower left lateral medulla (series 5, image 3), consistent with an acute ischemic infarct. No associated hemorrhage or mass effect. No other evidence for acute or subacute ischemia. No areas of chronic cortical infarction. No acute or chronic intracranial blood products. No mass lesion, midline shift or mass effect. No hydrocephalus or extra-axial fluid collection. Pituitary gland within normal limits. Vascular: Major intracranial vascular flow voids are maintained. Skull and upper cervical spine: Craniocervical junction within normal limits. Decreased T1 signal intensity noted within the bone marrow the visualized upper cervical spine, nonspecific, but most commonly related to anemia, smoking, or obesity. No scalp soft tissue abnormality. Sinuses/Orbits: Left gaze noted. Paranasal sinuses are largely clear. No mastoid effusion. Other: None. MRA HEAD FINDINGS Anterior circulation: Both internal carotid arteries are widely patent through the siphons without stenosis. A1 segments patent bilaterally. Normal anterior communicating artery complex. Anterior cerebral arteries patent without stenosis. No M1 stenosis or occlusion.  Distal MCA branches perfused and symmetric. Posterior circulation: Visualized distal V4 segments are patent without stenosis. Left vertebral artery dominant. Neither PICA origin visualized on this portion of the exam. Basilar patent without stenosis. Superior cerebral arteries patent bilaterally. Both PCAs primarily supplied via the basilar. PCAs are patent to their distal aspects without significant stenosis. Anatomic variants: As above.  No aneurysm. MRA NECK FINDINGS Aortic arch: Examination technically limited by motion artifact. Additionally, no time-of-flight sequence is available for review at time of this dictation. Visualized aortic arch within normal limits for caliber with standard branch pattern. No visible stenosis about the origin the great vessels. Right carotid system: Right common and internal carotid arteries are patent within the neck. No visible stenosis or dissection. Left carotid system: Left common and internal carotid arteries are patent within the neck. No visible stenosis or dissection. Vertebral arteries: Both vertebral arteries appear to arise from the subclavian arteries. No visible proximal subclavian artery stenosis. Neither vertebral artery origin well visualized on this motion degraded exam. Left vertebral artery dominant. Vertebral arteries patent within the neck without visible stenosis or dissection. Other: None IMPRESSION: MRI HEAD: 1. 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 2. Otherwise normal brain MRI for age. MRA HEAD: Normal intracranial MRA. No large vessel occlusion, hemodynamically significant stenosis, or other acute vascular abnormality.  MRA NECK: 1. Motion degraded exam. 2. Grossly negative MRA of the neck. No hemodynamically significant stenosis or other acute vascular abnormality. Electronically Signed   By: Rise Mu M.D.   On: 05/02/2023 00:33   MR Angiogram Neck W or Wo Contrast Result Date: 05/02/2023 CLINICAL DATA:   Initial evaluation for acute headache, neuro deficit. EXAM: MRI HEAD WITHOUT CONTRAST MRA HEAD WITHOUT CONTRAST MRA NECK WITHOUT AND WITH CONTRAST TECHNIQUE: Multiplanar, multi-echo pulse sequences of the brain and surrounding structures were acquired without intravenous contrast. Angiographic images of the Circle of Willis were acquired using MRA technique without intravenous contrast. Angiographic images of the neck were acquired using MRA technique without and with intravenous contrast. Carotid stenosis measurements (when applicable) are obtained utilizing NASCET criteria, using the distal internal carotid diameter as the denominator. CONTRAST:  8mL GADAVIST GADOBUTROL 1 MMOL/ML IV SOLN COMPARISON:  None Available. FINDINGS: MRI HEAD FINDINGS Brain: Cerebral volume within normal limits. No significant cerebral white matter disease for age. No other focal parenchymal signal abnormality. 6 mm focus of restricted diffusion seen involving the lower left lateral medulla (series 5, image 3), consistent with an acute ischemic infarct. No associated hemorrhage or mass effect. No other evidence for acute or subacute ischemia. No areas of chronic cortical infarction. No acute or chronic intracranial blood products. No mass lesion, midline shift or mass effect. No hydrocephalus or extra-axial fluid collection. Pituitary gland within normal limits. Vascular: Major intracranial vascular flow voids are maintained. Skull and upper cervical spine: Craniocervical junction within normal limits. Decreased T1 signal intensity noted within the bone marrow the visualized upper cervical spine, nonspecific, but most commonly related to anemia, smoking, or obesity. No scalp soft tissue abnormality. Sinuses/Orbits: Left gaze noted. Paranasal sinuses are largely clear. No mastoid effusion. Other: None. MRA HEAD FINDINGS Anterior circulation: Both internal carotid arteries are widely patent through the siphons without stenosis. A1 segments  patent bilaterally. Normal anterior communicating artery complex. Anterior cerebral arteries patent without stenosis. No M1 stenosis or occlusion. Distal MCA branches perfused and symmetric. Posterior circulation: Visualized distal V4 segments are patent without stenosis. Left vertebral artery dominant. Neither PICA origin visualized on this portion of the exam. Basilar patent without stenosis. Superior cerebral arteries patent bilaterally. Both PCAs primarily supplied via the basilar. PCAs are patent to their distal aspects without significant stenosis. Anatomic variants: As above.  No aneurysm. MRA NECK FINDINGS Aortic arch: Examination technically limited by motion artifact. Additionally, no time-of-flight sequence is available for review at time of this dictation. Visualized aortic arch within normal limits for caliber with standard branch pattern. No visible stenosis about the origin the great vessels. Right carotid system: Right common and internal carotid arteries are patent within the neck. No visible stenosis or dissection. Left carotid system: Left common and internal carotid arteries are patent within the neck. No visible stenosis or dissection. Vertebral arteries: Both vertebral arteries appear to arise from the subclavian arteries. No visible proximal subclavian artery stenosis. Neither vertebral artery origin well visualized on this motion degraded exam. Left vertebral artery dominant. Vertebral arteries patent within the neck without visible stenosis or dissection. Other: None IMPRESSION: MRI HEAD: 1. 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 2. Otherwise normal brain MRI for age. MRA HEAD: Normal intracranial MRA. No large vessel occlusion, hemodynamically significant stenosis, or other acute vascular abnormality. MRA NECK: 1. Motion degraded exam. 2. Grossly negative MRA of the neck. No hemodynamically significant stenosis or other acute vascular abnormality. Electronically  Signed  By: Rise Mu M.D.   On: 05/02/2023 00:33   MR ANGIO HEAD WO CONTRAST Result Date: 05/02/2023 CLINICAL DATA:  Initial evaluation for acute headache, neuro deficit. EXAM: MRI HEAD WITHOUT CONTRAST MRA HEAD WITHOUT CONTRAST MRA NECK WITHOUT AND WITH CONTRAST TECHNIQUE: Multiplanar, multi-echo pulse sequences of the brain and surrounding structures were acquired without intravenous contrast. Angiographic images of the Circle of Willis were acquired using MRA technique without intravenous contrast. Angiographic images of the neck were acquired using MRA technique without and with intravenous contrast. Carotid stenosis measurements (when applicable) are obtained utilizing NASCET criteria, using the distal internal carotid diameter as the denominator. CONTRAST:  8mL GADAVIST GADOBUTROL 1 MMOL/ML IV SOLN COMPARISON:  None Available. FINDINGS: MRI HEAD FINDINGS Brain: Cerebral volume within normal limits. No significant cerebral white matter disease for age. No other focal parenchymal signal abnormality. 6 mm focus of restricted diffusion seen involving the lower left lateral medulla (series 5, image 3), consistent with an acute ischemic infarct. No associated hemorrhage or mass effect. No other evidence for acute or subacute ischemia. No areas of chronic cortical infarction. No acute or chronic intracranial blood products. No mass lesion, midline shift or mass effect. No hydrocephalus or extra-axial fluid collection. Pituitary gland within normal limits. Vascular: Major intracranial vascular flow voids are maintained. Skull and upper cervical spine: Craniocervical junction within normal limits. Decreased T1 signal intensity noted within the bone marrow the visualized upper cervical spine, nonspecific, but most commonly related to anemia, smoking, or obesity. No scalp soft tissue abnormality. Sinuses/Orbits: Left gaze noted. Paranasal sinuses are largely clear. No mastoid effusion. Other: None. MRA  HEAD FINDINGS Anterior circulation: Both internal carotid arteries are widely patent through the siphons without stenosis. A1 segments patent bilaterally. Normal anterior communicating artery complex. Anterior cerebral arteries patent without stenosis. No M1 stenosis or occlusion. Distal MCA branches perfused and symmetric. Posterior circulation: Visualized distal V4 segments are patent without stenosis. Left vertebral artery dominant. Neither PICA origin visualized on this portion of the exam. Basilar patent without stenosis. Superior cerebral arteries patent bilaterally. Both PCAs primarily supplied via the basilar. PCAs are patent to their distal aspects without significant stenosis. Anatomic variants: As above.  No aneurysm. MRA NECK FINDINGS Aortic arch: Examination technically limited by motion artifact. Additionally, no time-of-flight sequence is available for review at time of this dictation. Visualized aortic arch within normal limits for caliber with standard branch pattern. No visible stenosis about the origin the great vessels. Right carotid system: Right common and internal carotid arteries are patent within the neck. No visible stenosis or dissection. Left carotid system: Left common and internal carotid arteries are patent within the neck. No visible stenosis or dissection. Vertebral arteries: Both vertebral arteries appear to arise from the subclavian arteries. No visible proximal subclavian artery stenosis. Neither vertebral artery origin well visualized on this motion degraded exam. Left vertebral artery dominant. Vertebral arteries patent within the neck without visible stenosis or dissection. Other: None IMPRESSION: MRI HEAD: 1. 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 2. Otherwise normal brain MRI for age. MRA HEAD: Normal intracranial MRA. No large vessel occlusion, hemodynamically significant stenosis, or other acute vascular abnormality. MRA NECK: 1. Motion degraded  exam. 2. Grossly negative MRA of the neck. No hemodynamically significant stenosis or other acute vascular abnormality. Electronically Signed   By: Rise Mu M.D.   On: 05/02/2023 00:33   CT HEAD WO CONTRAST Result Date: 05/01/2023 CLINICAL DATA:  Headache and pressure behind the left eye  with nausea, left-sided facial numbness and right arm numbness. EXAM: CT HEAD WITHOUT CONTRAST TECHNIQUE: Contiguous axial images were obtained from the base of the skull through the vertex without intravenous contrast. RADIATION DOSE REDUCTION: This exam was performed according to the departmental dose-optimization program which includes automated exposure control, adjustment of the mA and/or kV according to patient size and/or use of iterative reconstruction technique. COMPARISON:  None Available. FINDINGS: Brain: No evidence of acute infarction, hemorrhage, hydrocephalus, extra-axial collection or mass lesion/mass effect. Vascular: No hyperdense vessel or unexpected calcification. Skull: Normal. Negative for fracture or focal lesion. Sinuses/Orbits: No acute finding. Other: None. IMPRESSION: No acute intracranial pathology. Electronically Signed   By: Aram Candela M.D.   On: 05/01/2023 18:51     Data Reviewed: Relevant notes from primary care and specialist visits, past discharge summaries as available in EHR, including Care Everywhere. Prior diagnostic testing as pertinent to current admission diagnoses Updated medications and problem lists for reconciliation ED course, including vitals, labs, imaging, treatment and response to treatment Triage notes, nursing and pharmacy notes and ED provider's notes Notable results as noted in HPI   Assessment and Plan: * Acute CVA (cerebrovascular accident) [facility]) Patient without known risk factors for stroke, possible undiagnosed HTN Has a family history of premature CAD and stroke Will get routine stroke workup as well as hypercoagulability workup factor  V Leiden, protein S and C, Antithrombin Permissive hypertension for first 24-48 hrs post stroke onset: Prn Labetalol IV or Vasotec IV If BP greater than 220/120  Statins for LDL goal less than 70 ASA 81mg  daily, Telemetry, Echo with bubble study, continuous cardiac monitoring Hypercoagulability workup Avoid dextrose containing fluids, Maintain euglycemia, euthermia Neuro checks q4 hrs x 24 hrs and then per shift Head of bed 30 degrees Physical therapy/Occupational therapy/Speech therapy if failed dysphagia screen Neurology consult to follow   Family history of premature coronary artery disease Mother at bedside said her sister died of a heart attack at age 74, she herself had a heart attack in her 74s and another and had a stroke in her 68s Will get a hypercoagulability workup  Chronic bilateral low back pain with bilateral sciatica Continue gabapentin and Flexeril pending med rec        DVT prophylaxis: Lovenox  Consults: neurology  Advance Care Planning: full code  Family Communication: Mother at bedside  Disposition Plan: Back to previous home environment  Severity of Illness: The appropriate patient status for this patient is OBSERVATION. Observation status is judged to be reasonable and necessary in order to provide the required intensity of service to ensure the patient's safety. The patient's presenting symptoms, physical exam findings, and initial radiographic and laboratory data in the context of their medical condition is felt to place them at decreased risk for further clinical deterioration. Furthermore, it is anticipated that the patient will be medically stable for discharge from the hospital within 2 midnights of admission.   Author: Andris Baumann, MD 05/02/2023 12:56 AM  For on call review [email or web address].

## 2023-05-02 NOTE — Progress Notes (Addendum)
 SLP Cancellation Note  Patient Details Name: Charles Benitez MRN: [id number] DOB: 25-Dec-1975   Cancelled treatment:       Reason Eval/Treat Not Completed: SLP screened, no needs identified, will sign off (chart reviewed; consulted NSG then met w/ pt in room post EEG.)  Pt admitted w/ "medical history significant for Chronic low back pain with sciatica and family history of premature CAD and stroke with no other significant past medical history who presented to the ED with left-sided headache with pain behind left eye, left facial numbness, paresthesia of both hands and unsteady gait.". 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   Meeting w/ pt in the room, he denied any difficulty swallowing and is currently on a regular diet; tolerates swallowing pills w/ water per NSG. Pt conversed to answer general questions re: self including DOB, address, and what occurred for him to have to be admitted to the ED. No overt expressive/receptive deficits noted in his responses; speech was 100% intelligible. Symmetry of face appeared grossly WFL at during conversation. Pt denied any speech deficits and no swallowing issues. Pt was able to present his Menu and tell how he will order his next meal.  No Acute skilled ST services indicated currently. Recommend f/u w/ formal assessment at his next venue of care if any changes noted from his Baseline during his usual ADLs. Pt agreed. NSG to reconsult if any change in status while admitted. MD updated, agreed.     Jerilynn Som, MS, CCC-SLP Speech Language Pathologist Rehab Services; [facility] [phone] (ascom) Yang Rack 05/02/2023, 2:40 PM

## 2023-05-02 NOTE — Progress Notes (Signed)
*  PRELIMINARY RESULTS* Echocardiogram 2D Echocardiogram has been performed.  Carolyne Fiscal 05/02/2023, 3:52 PM

## 2023-05-02 NOTE — Assessment & Plan Note (Signed)
 Continue gabapentin and Flexeril pending med rec

## 2023-05-02 NOTE — Assessment & Plan Note (Signed)
 Mother at bedside said her sister died of a heart attack at age 47, she herself had a heart attack in her 106s and another and had a stroke in her 88s Will get a hypercoagulability workup

## 2023-05-02 NOTE — Evaluation (Signed)
 Physical Therapy Evaluation Patient Details Name: Charles Benitez MRN: [id number] DOB: 03-12-1975 Today's Date: 05/02/2023  History of Present Illness  Charles Benitez is a 48 y.o. male with medical history significant for Chronic low back pain with sciatica and family history of premature CAD and stroke with no other significant past medical history who presented to the ED with left-sided headache with pain behind left eye, left facial numbness, paresthesia of both hands and unsteady gait. 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  Clinical Impression  Patient resting in bed upon arrival to room; supportive sister present at bedside throughout session. Patient alert and oriented to basic information; however, with noted deficits in insight, judgement and overall safety awareness.  Endorses generalized pain to L head/eye area (FACES 4/10); patient questions if it could be related to palpable nodule to medial, L-eye area. Patient noted with good isolated strength and ROM to bilat UE/LE (grossly 4+/5 throughout).  Does endorse generalized paresthesia bilat UEs, elbows distally, R > L; otherwise, denies sensory deficit.  Isolated propriceptive testing grossly WFL; does present with marked functional sensory/proprioceptive deficit to L LE with standing/gait efforts.   Able to complete bed mobility with supervision; maintains unsupported sitting balance with close sup (adjusting balance to midline indep after OT session this AM); requires mod assist for sit/stand, standing balance and gait (20' x2) without assist device.  Demonstrates reciprocal stepping pattern with marked impairments in functional control, placement and awareness of L LE with gait efforts. Intermittent scissoring with clunky, labored contact to floor. L lateral lean/weight shift worsens with divided attention and dynamic activity, consistent mod assist to  maintain balance/prevent fall. Does tend to reach to furniture with bilat UEs throughout gait trial, difficulty releasing grasp of objects with L UE during gait trial. Very high fall risk; unsafe to attempt without consistent +1 at all times. Would benefit from skilled PT to address above deficits and promote optimal return to PLOF.; recommend post-acute PT follow up as indicated by interdisciplinary care team.    Patient exceptionally motivated and with excellent rehab potential; great candidate for high-intensity (>3 hours/day), post-acute rehab services.       If plan is discharge home, recommend the following: A lot of help with walking and/or transfers;A lot of help with bathing/dressing/bathroom   Can travel by private vehicle        Equipment Recommendations    Recommendations for Other Services       Functional Status Assessment Patient has had a recent decline in their functional status and demonstrates the ability to make significant improvements in function in a reasonable and predictable amount of time.     Precautions / Restrictions Precautions Precautions: Fall Restrictions Weight Bearing Restrictions Per Provider Order: No      Mobility  Bed Mobility Overal bed mobility: Needs Assistance Bed Mobility: Supine to Sit, Sit to Supine     Supine to sit: Contact guard Sit to supine: Contact guard assist        Transfers Overall transfer level: Needs assistance Equipment used: None Transfers: Sit to/from Stand Sit to Stand: Mod assist           General transfer comment: L ant/lateral lean in standing, min/mod assist for correction    Ambulation/Gait Ambulation/Gait assistance: Mod assist Gait Distance (Feet):  (20' x2) Assistive device: None         General Gait Details: reciprocal stepping pattern with marked impairments in  functional control, placement and awareness of L LE with gait efforts.  Intermittent scissoring with clunky, labored contact  to floor.  L lateral lean/weight shift worsens with divided attention and dynamic activity, consistent mod assist to maintain balance/prevent fall.  Does tend to reach to furniture with bilat UEs throughout gait trial, difficulty releasing grasp of objects with L UE during gait trial  Stairs            Wheelchair Mobility     Tilt Bed    Modified Rankin (Stroke Patients Only)       Balance Overall balance assessment: Needs assistance Sitting-balance support: No upper extremity supported, Feet supported Sitting balance-Leahy Scale: Fair Sitting balance - Comments: L lateral lean, but does correct indep Postural control: Left lateral lean Standing balance support: Reliant on assistive device for balance, During functional activity, Single extremity supported, No upper extremity supported Standing balance-Leahy Scale: Poor                               Pertinent Vitals/Pain Pain Assessment Pain Assessment: Faces Faces Pain Scale: Hurts little more Pain Location: Headache, L eye pain Pain Descriptors / Indicators: Discomfort, Sore Pain Intervention(s): Limited activity within patient's tolerance, Monitored during session, Repositioned    Home Living Family/patient expects to be discharged to:: Private residence Living Arrangements: Children;Parent Available Help at Discharge: Family;Available PRN/intermittently Type of Home: House Home Access: Level entry     Alternate Level Stairs-Number of Steps: flight Home Layout: Two level;Bed/bath upstairs Home Equipment: None      Prior Function Prior Level of Function : Independent/Modified Independent;Driving             Mobility Comments: Independent, denies falls history ADLs Comments: Independent with ADL/IADLs, cares for children, not currently working but hopeful he can get back to it soon.     Extremity/Trunk Assessment   Upper Extremity Assessment Upper Extremity Assessment:  (grossly 4+/5  throughout; generalized paresthesia elbows distally bilat, R > L; finger-to-nose with mild delay, L > R) LUE Deficits / Details: Strength/ROM WFL, some ataxic movements appreciated with intentional movements/initiation. Will continue to monitor. Decreased FMC as compared to R.    Lower Extremity Assessment Lower Extremity Assessment:  (grossly 4+/5 throughout with isolated MMT; denies sensory deficit, isolated proprioception intact.  Significant ataxia, coordination and functional propriceptive deficits in standing/gait) LLE Deficits / Details: Noted decreased smotheness of movement with intentional LLE movement, pt reports LLE feels weaker than usualy particularly when weight bearing. Tends to lean to L during functional tasks . Pt states it "feels like I'm being pulled to this side".    Cervical / Trunk Assessment Cervical / Trunk Assessment: Normal  Communication   Communication Communication: No apparent difficulties    Cognition Arousal: Alert Behavior During Therapy: WFL for tasks assessed/performed, Impulsive   PT - Cognitive impairments: Awareness, Attention, Safety/Judgement                         Following commands: Impaired Following commands impaired: Follows one step commands with increased time, Follows multi-step commands inconsistently     Cueing       General Comments      Exercises Other Exercises Other Exercises: Toilet transfer, ambulatory without assist device, mod assist +1.  Max cuing for L LE foot placement, knee control and dynamic balance; L LE placement optimized when patient visually checks/attends to extremity prior to loading.  Generally  impulsive Other Exercises: Mild lag to L eye with visual tracking, mild impairment in convergence noted; does endorse diplopia and blurred vision.  May consider partial-occlusion taping in subsequent sessions   Assessment/Plan    PT Assessment Patient needs continued PT services  PT Problem List  Decreased strength;Decreased range of motion;Decreased activity tolerance;Decreased balance;Decreased mobility;Decreased coordination;Decreased cognition;Decreased knowledge of use of DME;Decreased safety awareness;Decreased knowledge of precautions       PT Treatment Interventions DME instruction;Gait training;Stair training;Functional mobility training;Therapeutic activities;Therapeutic exercise;Balance training;Neuromuscular re-education;Cognitive remediation;Patient/family education    PT Goals (Current goals can be found in the Care Plan section)  Acute Rehab PT Goals Patient Stated Goal: to get better PT Goal Formulation: With patient/family Time For Goal Achievement: 05/16/23 Potential to Achieve Goals: Good    Frequency 7X/week     Co-evaluation               AM-PAC PT "6 Clicks" Mobility  Outcome Measure Help needed turning from your back to your side while in a flat bed without using bedrails?: None Help needed moving from lying on your back to sitting on the side of a flat bed without using bedrails?: A Little Help needed moving to and from a bed to a chair (including a wheelchair)?: A Lot Help needed standing up from a chair using your arms (e.g., wheelchair or bedside chair)?: A Lot Help needed to walk in hospital room?: A Lot Help needed climbing 3-5 steps with a railing? : A Lot 6 Click Score: 15    End of Session   Activity Tolerance: Patient tolerated treatment well Patient left: in bed;with call bell/phone within reach;with bed alarm set;with family/visitor present Nurse Communication: Mobility status PT Visit Diagnosis: Hemiplegia and hemiparesis Hemiplegia - Right/Left: Left Hemiplegia - dominant/non-dominant: Non-dominant Hemiplegia - caused by: Cerebral infarction    Time: 2130-8657 PT Time Calculation (min) (ACUTE ONLY): 20 min   Charges:   PT Evaluation $PT Eval Moderate Complexity: 1 Mod PT Treatments $Therapeutic Activity: 8-22 mins PT  General Charges $$ ACUTE PT VISIT: 1 Visit         Rykin Route H. Manson Passey, PT, DPT, NCS 05/02/23, 2:02 PM [phone]

## 2023-05-02 NOTE — Progress Notes (Signed)
 Interim progress note not for billing.  I have reviewed the chart, examined the patient, agree with h and p assessment and plan. Here with left sided facial numbness and gait dysfunction, found to have acute left lateral medullary infarct. Remains very unsteady on his feet and light-headed. Tte pending, no events on tele, pt/ob consults are in, neuro to see, started on aspirin. Ldl is less than 70. Hypercoag w/u ordered by admitting provider.

## 2023-05-02 NOTE — Assessment & Plan Note (Addendum)
 Patient without known risk factors for stroke, possible undiagnosed HTN Has a family history of premature CAD and stroke Will get routine stroke workup as well as hypercoagulability workup factor V Leiden, protein S and C, Antithrombin Permissive hypertension for first 24-48 hrs post stroke onset: Prn Labetalol IV or Vasotec IV If BP greater than 220/120  Statins for LDL goal less than 70 ASA 81mg  daily, Telemetry, Echo with bubble study, continuous cardiac monitoring Hypercoagulability workup Avoid dextrose containing fluids, Maintain euglycemia, euthermia Neuro checks q4 hrs x 24 hrs and then per shift Head of bed 30 degrees Physical therapy/Occupational therapy/Speech therapy if failed dysphagia screen Neurology consult to follow

## 2023-05-02 NOTE — Progress Notes (Signed)
   Inpatient Rehab Admissions Coordinator :  Per therapy recommendations, patient was screened for CIR candidacy by Ottie Glazier RN MSN.  At this time patient appears to be a potential candidate for CIR. I will place a rehab consult per protocol for full assessment. Please call me with any questions.  Ottie Glazier RN MSN Admissions Coordinator [phone]

## 2023-05-02 NOTE — Progress Notes (Signed)
 NEUROLOGY CONSULT FOLLOW UP NOTE   Date of service: May 02, 2023 Patient Name: Charles Benitez MRN:  [id number] DOB:  22-Jul-1975  Interval Hx/subjective   - Patient was seen by teleneurology overnight - MRI brain showed 5mm left lateral medullary infarct - Patient states dizziness is better, still very unsteady on his feet  Vitals   Vitals:   05/02/23 0030 05/02/23 0221 05/02/23 0751 05/02/23 1236  BP: (!) 127/99 (!) 160/86 119/82 130/74  Pulse: (!) 58 (!) 55 62 63  Resp:  17 17 18   Temp:  97.6 F (36.4 C) 97.8 F (36.6 C) 98 F (36.7 C)  TempSrc:  Oral    SpO2: 100% 99% 98% 100%  Weight:  81.1 kg    Height:  5\' 6"  (1.676 m)       Body mass index is 28.86 kg/m.  Physical Exam    Gen: patient lying in bed, NAD CV: extremities appear well-perfused Resp: normal WOB  Neurologic Examination   MS: alert, oriented x4, follows commands Speech: no dysarthria, no aphasia CN: PERRL, VFF, EOMI, sensation intact, face symmetric, hearing intact to voice Motor: 5/5 strength throughout Sensory: SILT Reflexes: 2+ symm with toes down bilat Coordination: FNF with dysmetria but not frank ataxia bilat Gait: patient unable to stand and bear weight without falling  Medications  Current Facility-Administered Medications:    [START ON 05/03/2023]  stroke: early stages of recovery book, , Does not apply, Once, Andris Baumann, MD   acetaminophen (TYLENOL) tablet 650 mg, 650 mg, Oral, Q4H PRN **OR** acetaminophen (TYLENOL) 160 MG/5ML solution 650 mg, 650 mg, Per Tube, Q4H PRN **OR** acetaminophen (TYLENOL) suppository 650 mg, 650 mg, Rectal, Q4H PRN, Andris Baumann, MD   aspirin EC tablet 81 mg, 81 mg, Oral, Daily, Andris Baumann, MD   cyclobenzaprine (FLEXERIL) tablet 10 mg, 10 mg, Oral, TID PRN, Andris Baumann, MD   enoxaparin (LOVENOX) injection 40 mg, 40 mg, Subcutaneous, Q24H, Lindajo Royal V, MD, 40 mg at 05/02/23 1044   gabapentin (NEURONTIN) capsule 100 mg, 100 mg, Oral,  QHS, Andris Baumann, MD  Labs and Diagnostic Imaging   CBC:  Recent Labs  Lab 05/01/23 1715 05/02/23 0610  WBC 13.4*  --   NEUTROABS 10.4*  --   HGB 16.7 15.4  HCT 49.9 45.1  MCV 91.4  --   PLT 341 294    Basic Metabolic Panel:  Lab Results  Component Value Date   NA 138 05/01/2023   K 4.3 05/01/2023   CO2 25 05/01/2023   GLUCOSE 133 (H) 05/01/2023   BUN 14 05/01/2023   CREATININE 0.71 05/01/2023   CALCIUM 8.6 (L) 05/01/2023   GFRNONAA >60 05/01/2023   GFRAA >60 06/05/2017   Lipid Panel:  Lab Results  Component Value Date   LDLCALC 67 05/02/2023   HgbA1c:  Lab Results  Component Value Date   HGBA1C 5.3 05/01/2023   Urine Drug Screen: No results found for: "LABOPIA", "COCAINSCRNUR", "LABBENZ", "AMPHETMU", "THCU", "LABBARB"  Alcohol Level     Component Value Date/Time   ETH <10 05/01/2023 1715   INR  Lab Results  Component Value Date   INR 1.0 05/01/2023   APTT  Lab Results  Component Value Date   APTT 27 05/02/2023   AED levels: No results found for: "PHENYTOIN", "ZONISAMIDE", "LAMOTRIGINE", "LEVETIRACETA"  MRI HEAD:   1. 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 2. Otherwise normal brain MRI for age.   MRA HEAD:  Normal intracranial MRA. No large vessel occlusion, hemodynamically significant stenosis, or other acute vascular abnormality.   MRA NECK:   1. Motion degraded exam. 2. Grossly negative MRA of the neck. No hemodynamically significant stenosis or other acute vascular abnormality.  TTE  1. Left ventricular ejection fraction, by estimation, is 60 to 65%. The  left ventricle has normal function. The left ventricle has no regional  wall motion abnormalities. Left ventricular diastolic parameters were  normal. The average left ventricular  global longitudinal strain is -20.6 %. The global longitudinal strain is  normal.   2. Right ventricular systolic function is normal. The right ventricular  size is  normal. There is normal pulmonary artery systolic pressure.   3. The mitral valve is normal in structure. No evidence of mitral valve  regurgitation. No evidence of mitral stenosis.   4. The aortic valve is normal in structure. Aortic valve regurgitation is  not visualized. No aortic stenosis is present.   5. The inferior vena cava is normal in size with greater than 50%  respiratory variability, suggesting right atrial pressure of 3 mmHg.   6. Agitated saline contrast bubble study was negative, with no evidence  of any interatrial shunt.   Assessment   Donna Silverman is a 48 y.o. male  who presents with gait instability and was found to have left lateral medullary infarct. Small vessel disease is favored etiology but given young age embolic etiology and/or hypercoag state cannot be ruled out.  Recommendations   - ASA 81mg  daily + plavix 75mg  daily x21 days f/b ASA 81mg  daily monotherapy after that - Ambulatory cardiac monitoring at discharge - Hypercoag panel ordered, may be followed up after discharge - I will arrange outpatient f/u - Meclizine prn prior to PT - Continue working with PT this will be fundamental to his recovery - Neurology to sign off, please re-engage if additional neurologic concerns arise. ______________________________________________________________________   Signed, Jefferson Fuel, MD [facility]

## 2023-05-02 NOTE — ED Provider Notes (Signed)
 12:43 AM  Assumed care at shift change.  Patient presented with left-sided headache, left facial numbness, right arm numbness, ataxia that started at 11 AM.  Outside of tPA window.  MRIs were pending at signout.  Imaging reviewed and interpreted by myself and the radiologist.  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  Otherwise MRIs unremarkable.  Will give full dose aspirin.  Will discuss with hospitalist for admission for stroke workup.   Merilee Wible, Layla Maw, DO 05/02/23 [phone]

## 2023-05-02 NOTE — Evaluation (Signed)
 Occupational Therapy Evaluation Patient Details Name: Charles Benitez MRN: [id number] DOB: 01-01-1976 Today's Date: 05/02/2023   History of Present Illness   Clayton Bosserman is a 48 y.o. male with medical history significant for Chronic low back pain with sciatica and family history of premature CAD and stroke with no other significant past medical history who presented to the ED with left-sided headache with pain behind left eye, left facial numbness, paresthesia of both hands and unsteady gait. 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     Clinical Impressions Mr. Marcussen was seen for OT evaluation this date. Prior to hospital admission, pt was active and independent with ADL/IADL management. Pt lives with his mother and 27 year old son. He also provides care for his 76 year old daughter regularly. Pt presents to acute OT demonstrating impaired ADL performance and functional mobility 2/2 decreased functional use of his LUE/LLE, decreased balance, decreased safety awareness, and limited cognition in setting of CVA (See OT problem list for additional functional deficits). Pt currently requires MOD A for bed/functional mobility, MOD A to maintain static standing balance at sink for UB grooming, and MOD A to sit EOB to perform LB ADL management. See ADL section below. Pt would benefit from skilled OT services to address noted impairments and functional limitations (see below for any additional details) in order to maximize safety and independence while minimizing falls risk and caregiver burden. Anticipate the need for acute intensive rehabilitative services upon acute hospital DC to maximize safety and return to PLOF.      If plan is discharge home, recommend the following:   A lot of help with walking and/or transfers;A lot of help with bathing/dressing/bathroom;Assistance with cooking/housework;Assist for  transportation;Help with stairs or ramp for entrance     Functional Status Assessment   Patient has had a recent decline in their functional status and demonstrates the ability to make significant improvements in function in a reasonable and predictable amount of time.     Equipment Recommendations   Other (comment) (defer to next venue of care)     Recommendations for Other Services   Rehab consult     Precautions/Restrictions   Precautions Precautions: Fall Restrictions Weight Bearing Restrictions Per Provider Order: No     Mobility Bed Mobility Overal bed mobility: Needs Assistance Bed Mobility: Supine to Sit, Sit to Supine     Supine to sit: Mod assist Sit to supine: Used rails, Contact guard assist   General bed mobility comments: MOD A for trunk elevation and to maintain initial upright posture when coming to sit at EOB.    Transfers Overall transfer level: Needs assistance Equipment used: Rolling walker (2 wheels) Transfers: Sit to/from Stand Sit to Stand: Mod assist, From elevated surface                  Balance Overall balance assessment: Needs assistance Sitting-balance support: Feet supported, Single extremity supported, No upper extremity supported Sitting balance-Leahy Scale: Poor Sitting balance - Comments: requires intermittent support to maintain sitting balance at EOB. Postural control: Left lateral lean Standing balance support: Reliant on assistive device for balance, During functional activity, Single extremity supported, No upper extremity supported Standing balance-Leahy Scale: Poor                             ADL either performed or assessed with clinical judgement  ADL Overall ADL's : Needs assistance/impaired                                       General ADL Comments: MOD A for bed/functional mobility 2/2 significant L lateral lean and instability with upright positionign including sitting  and standing balance. Able to stand at sink with MOD A and perform HH with min cueing for sequencing of task  (i.e to use soap). Noted to overshoot when using LUE for functional activity with decreased accuracy when throwing away paper towel and reaching for soap dispenser. Requires MOD A for static sitting balance at EOB to don bilat hospital socks. No physical assist to thread socks over feet, but requires increased time to perform.     Vision   Vision Assessment?: Yes Eye Alignment: Within Functional Limits Ocular Range of Motion: Restricted on the left;Restricted looking up;Impaired-to be further tested in functional context Alignment/Gaze Preference: Within Defined Limits Tracking/Visual Pursuits: Impaired - to be further tested in functional context;Decreased smoothness of eye movement to LEFT inferior field;Decreased smoothness of eye movement to LEFT superior field     Perception         Praxis Praxis: Impaired Praxis Impairment Details: Motor planning, Organization, Ideomotor     Pertinent Vitals/Pain Pain Assessment Pain Assessment: Faces Faces Pain Scale: Hurts little more Pain Location: Headache, L eye pain Pain Descriptors / Indicators: Discomfort, Sore Pain Intervention(s): Limited activity within patient's tolerance, Monitored during session     Extremity/Trunk Assessment Upper Extremity Assessment Upper Extremity Assessment: Right hand dominant;LUE deficits/detail LUE Deficits / Details: Strength/ROM WFL, some ataxic movements appreciated with intentional movements/initiation. Will continue to monitor. Decreased FMC as compared to R.   Lower Extremity Assessment Lower Extremity Assessment: Defer to PT evaluation;LLE deficits/detail LLE Deficits / Details: Noted decreased smotheness of movement with intentional LLE movement, pt reports LLE feels weaker than usualy particularly when weight bearing. Tends to lean to L during functional tasks . Pt states it "feels like  I'm being pulled to this side".   Cervical / Trunk Assessment Cervical / Trunk Assessment: Normal   Communication Communication Communication: No apparent difficulties   Cognition Arousal: Alert Behavior During Therapy: WFL for tasks assessed/performed, Impulsive Cognition: Cognition impaired     Awareness: Intellectual awareness intact, Online awareness impaired   Attention impairment (select first level of impairment): Sustained attention Executive functioning impairment (select all impairments): Reasoning, Problem solving                   Following commands: Impaired Following commands impaired: Follows one step commands with increased time, Follows multi-step commands inconsistently     Cueing  General Comments   Cueing Techniques: Verbal cues;Gestural cues;Tactile cues      Exercises Other Exercises Other Exercises: Pt/sister educated on role of OT, safety, falls prevention strategies, sare use of AE for functional transfers and DC recs.   Shoulder Instructions      Home Living Family/patient expects to be discharged to:: Private residence Living Arrangements: Children;Parent (Mother, 67 y.o. son) Available Help at Discharge: Family;Available PRN/intermittently (Limited physical assist but could provide 24/7 supervision) Type of Home: House Home Access: Level entry     Home Layout: Two level;Bed/bath upstairs Alternate Level Stairs-Number of Steps: flight   Bathroom Shower/Tub: Chief Strategy Officer: Standard     Home Equipment: None  Prior Functioning/Environment Prior Level of Function : Independent/Modified Independent;Driving             Mobility Comments: Independent, denies falls history ADLs Comments: Independent with ADL/IADLs, cares for children, not currently working but hopeful he can get back to it soon.    OT Problem List: Decreased strength;Decreased coordination;Decreased range of motion;Decreased  cognition;Decreased safety awareness;Impaired balance (sitting and/or standing);Decreased knowledge of use of DME or AE;Impaired UE functional use   OT Treatment/Interventions: Self-care/ADL training;Therapeutic exercise;Therapeutic activities;DME and/or AE instruction;Patient/family education;Balance training;Energy conservation;Neuromuscular education;Cognitive remediation/compensation      OT Goals(Current goals can be found in the care plan section)   Acute Rehab OT Goals Patient Stated Goal: To get stronger and go home OT Goal Formulation: With patient Time For Goal Achievement: 05/16/23 Potential to Achieve Goals: Good ADL Goals Pt Will Perform Grooming: standing;with modified independence;with adaptive equipment Pt Will Perform Lower Body Dressing: sit to/from stand;with modified independence;with adaptive equipment Pt Will Transfer to Toilet: bedside commode;regular height toilet;ambulating;with modified independence Pt Will Perform Toileting - Clothing Manipulation and hygiene: with modified independence;with adaptive equipment;sit to/from stand   OT Frequency:  Min 1X/week    Co-evaluation              AM-PAC OT "6 Clicks" Daily Activity     Outcome Measure Help from another person eating meals?: A Little Help from another person taking care of personal grooming?: A Little Help from another person toileting, which includes using toliet, bedpan, or urinal?: A Lot Help from another person bathing (including washing, rinsing, drying)?: A Lot Help from another person to put on and taking off regular upper body clothing?: A Little Help from another person to put on and taking off regular lower body clothing?: A Lot 6 Click Score: 15   End of Session Equipment Utilized During Treatment: Gait belt;Rolling walker (2 wheels) Nurse Communication: Other (comment);Mobility status (DC recs)  Activity Tolerance: Patient tolerated treatment well Patient left: in bed;with call  bell/phone within reach;with bed alarm set  OT Visit Diagnosis: Other abnormalities of gait and mobility (R26.89);Other symptoms and signs involving the nervous system (R29.898);Hemiplegia and hemiparesis Hemiplegia - Right/Left: Left Hemiplegia - dominant/non-dominant: Non-Dominant Hemiplegia - caused by: Cerebral infarction                Time: 1610-9604 OT Time Calculation (min): 32 min Charges:  OT General Charges $OT Visit: 1 Visit OT Evaluation $OT Eval Moderate Complexity: 1 Mod OT Treatments $Self Care/Home Management : 8-22 mins  Rockney Ghee, M.S., OTR/L 05/02/23, 1:32 PM

## 2023-05-03 ENCOUNTER — Other Ambulatory Visit: Payer: Self-pay

## 2023-05-03 ENCOUNTER — Encounter (HOSPITAL_COMMUNITY): Payer: Self-pay | Admitting: Physical Medicine & Rehabilitation

## 2023-05-03 ENCOUNTER — Inpatient Hospital Stay (HOSPITAL_COMMUNITY)
Admission: AD | Admit: 2023-05-03 | Discharge: 2023-05-10 | DRG: 057 | Disposition: A | Discharge status: Home / Self Care | Payer: Self-pay | Source: Intra-hospital | Attending: Physical Medicine & Rehabilitation | Admitting: Physical Medicine & Rehabilitation

## 2023-05-03 DIAGNOSIS — I69398 Other sequelae of cerebral infarction: Principal | ICD-10-CM

## 2023-05-03 DIAGNOSIS — G8929 Other chronic pain: Secondary | ICD-10-CM | POA: Diagnosis present

## 2023-05-03 DIAGNOSIS — R200 Anesthesia of skin: Secondary | ICD-10-CM | POA: Diagnosis present

## 2023-05-03 DIAGNOSIS — Z8249 Family history of ischemic heart disease and other diseases of the circulatory system: Secondary | ICD-10-CM | POA: Diagnosis not present

## 2023-05-03 DIAGNOSIS — R2681 Unsteadiness on feet: Secondary | ICD-10-CM | POA: Diagnosis present

## 2023-05-03 DIAGNOSIS — M62838 Other muscle spasm: Secondary | ICD-10-CM | POA: Diagnosis present

## 2023-05-03 DIAGNOSIS — I63542 Cerebral infarction due to unspecified occlusion or stenosis of left cerebellar artery: Secondary | ICD-10-CM | POA: Diagnosis present

## 2023-05-03 DIAGNOSIS — H00014 Hordeolum externum left upper eyelid: Secondary | ICD-10-CM | POA: Diagnosis present

## 2023-05-03 DIAGNOSIS — Z91013 Allergy to seafood: Secondary | ICD-10-CM | POA: Diagnosis not present

## 2023-05-03 DIAGNOSIS — R29701 NIHSS score 1: Secondary | ICD-10-CM | POA: Diagnosis not present

## 2023-05-03 DIAGNOSIS — F17210 Nicotine dependence, cigarettes, uncomplicated: Secondary | ICD-10-CM | POA: Diagnosis present

## 2023-05-03 DIAGNOSIS — Z7902 Long term (current) use of antithrombotics/antiplatelets: Secondary | ICD-10-CM | POA: Diagnosis not present

## 2023-05-03 DIAGNOSIS — Z823 Family history of stroke: Secondary | ICD-10-CM | POA: Diagnosis not present

## 2023-05-03 DIAGNOSIS — F411 Generalized anxiety disorder: Secondary | ICD-10-CM | POA: Diagnosis present

## 2023-05-03 DIAGNOSIS — Z7982 Long term (current) use of aspirin: Secondary | ICD-10-CM | POA: Diagnosis not present

## 2023-05-03 DIAGNOSIS — J45909 Unspecified asthma, uncomplicated: Secondary | ICD-10-CM | POA: Diagnosis present

## 2023-05-03 DIAGNOSIS — R29700 NIHSS score 0: Secondary | ICD-10-CM | POA: Diagnosis present

## 2023-05-03 DIAGNOSIS — R262 Difficulty in walking, not elsewhere classified: Secondary | ICD-10-CM | POA: Diagnosis present

## 2023-05-03 DIAGNOSIS — R278 Other lack of coordination: Secondary | ICD-10-CM | POA: Diagnosis present

## 2023-05-03 DIAGNOSIS — K5900 Constipation, unspecified: Secondary | ICD-10-CM | POA: Diagnosis present

## 2023-05-03 DIAGNOSIS — M549 Dorsalgia, unspecified: Secondary | ICD-10-CM | POA: Diagnosis present

## 2023-05-03 DIAGNOSIS — Z79899 Other long term (current) drug therapy: Secondary | ICD-10-CM | POA: Diagnosis not present

## 2023-05-03 DIAGNOSIS — M5442 Lumbago with sciatica, left side: Secondary | ICD-10-CM | POA: Diagnosis present

## 2023-05-03 DIAGNOSIS — R001 Bradycardia, unspecified: Secondary | ICD-10-CM | POA: Diagnosis present

## 2023-05-03 DIAGNOSIS — M4723 Other spondylosis with radiculopathy, cervicothoracic region: Secondary | ICD-10-CM

## 2023-05-03 DIAGNOSIS — I639 Cerebral infarction, unspecified: Secondary | ICD-10-CM | POA: Diagnosis not present

## 2023-05-03 DIAGNOSIS — Z634 Disappearance and death of family member: Secondary | ICD-10-CM | POA: Diagnosis not present

## 2023-05-03 DIAGNOSIS — M5441 Lumbago with sciatica, right side: Secondary | ICD-10-CM | POA: Diagnosis present

## 2023-05-03 DIAGNOSIS — M51369 Other intervertebral disc degeneration, lumbar region without mention of lumbar back pain or lower extremity pain: Secondary | ICD-10-CM

## 2023-05-03 DIAGNOSIS — H5712 Ocular pain, left eye: Secondary | ICD-10-CM | POA: Diagnosis present

## 2023-05-03 LAB — HOMOCYSTEINE: Homocysteine: 8.5 umol/L (ref 0.0–14.5)

## 2023-05-03 LAB — PROTEIN S ACTIVITY: Protein S Activity: 70 % (ref 63–140)

## 2023-05-03 MED ORDER — DIPHENHYDRAMINE HCL 25 MG PO CAPS: 25.0000 mg | ORAL_CAPSULE | Freq: Four times a day (QID) | ORAL | Status: DC | PRN

## 2023-05-03 MED ORDER — ENOXAPARIN SODIUM 40 MG/0.4ML IJ SOSY
40.0000 mg | PREFILLED_SYRINGE | INTRAMUSCULAR | Status: DC
  Administered 2023-05-04 – 2023-05-08 (×5): 40 mg via SUBCUTANEOUS
  Filled 2023-05-03 (×5): qty 0.4

## 2023-05-03 MED ORDER — ALUM & MAG HYDROXIDE-SIMETH 200-200-20 MG/5ML PO SUSP: 30.0000 mL | ORAL | Status: DC | PRN

## 2023-05-03 MED ORDER — ONDANSETRON HCL 4 MG PO TABS: 4.0000 mg | ORAL_TABLET | Freq: Four times a day (QID) | ORAL | Status: DC | PRN

## 2023-05-03 MED ORDER — MECLIZINE HCL 25 MG PO TABS: 25.0000 mg | ORAL_TABLET | Freq: Two times a day (BID) | ORAL | Status: DC | PRN

## 2023-05-03 MED ORDER — POLYETHYLENE GLYCOL 3350 17 G PO PACK
17.0000 g | PACK | Freq: Every day | ORAL | Status: DC | PRN
  Administered 2023-05-05 – 2023-05-08 (×2): 17 g via ORAL
  Filled 2023-05-03 (×3): qty 1

## 2023-05-03 MED ORDER — BISACODYL 5 MG PO TBEC
5.0000 mg | DELAYED_RELEASE_TABLET | Freq: Every day | ORAL | Status: DC | PRN
  Administered 2023-05-05: 5 mg via ORAL
  Filled 2023-05-03: qty 1

## 2023-05-03 MED ORDER — GUAIFENESIN-DM 100-10 MG/5ML PO SYRP: 10.0000 mL | ORAL_SOLUTION | Freq: Four times a day (QID) | ORAL | Status: DC | PRN

## 2023-05-03 MED ORDER — ASPIRIN 81 MG PO TBEC
81.0000 mg | DELAYED_RELEASE_TABLET | Freq: Every day | ORAL | Status: DC
  Administered 2023-05-04 – 2023-05-10 (×7): 81 mg via ORAL
  Filled 2023-05-03 (×7): qty 1

## 2023-05-03 MED ORDER — ENOXAPARIN SODIUM 40 MG/0.4ML IJ SOSY: 40.0000 mg | PREFILLED_SYRINGE | INTRAMUSCULAR | Status: DC

## 2023-05-03 MED ORDER — CLOPIDOGREL BISULFATE 75 MG PO TABS
75.0000 mg | ORAL_TABLET | Freq: Every day | ORAL | Status: DC
  Administered 2023-05-04 – 2023-05-10 (×7): 75 mg via ORAL
  Filled 2023-05-03 (×7): qty 1

## 2023-05-03 MED ORDER — CLOPIDOGREL BISULFATE 75 MG PO TABS
75.0000 mg | ORAL_TABLET | Freq: Every day | ORAL | Status: DC
  Administered 2023-05-03: 75 mg via ORAL
  Filled 2023-05-03: qty 1

## 2023-05-03 MED ORDER — ACETAMINOPHEN 325 MG PO TABS
325.0000 mg | ORAL_TABLET | ORAL | Status: DC | PRN
  Administered 2023-05-04 – 2023-05-07 (×2): 650 mg via ORAL
  Filled 2023-05-03 (×3): qty 2

## 2023-05-03 MED ORDER — METHOCARBAMOL 500 MG PO TABS
500.0000 mg | ORAL_TABLET | Freq: Four times a day (QID) | ORAL | Status: DC | PRN
  Administered 2023-05-07 – 2023-05-09 (×2): 500 mg via ORAL
  Filled 2023-05-03 (×2): qty 1

## 2023-05-03 MED ORDER — MAGNESIUM CITRATE PO SOLN
1.0000 | Freq: Once | ORAL | Status: AC | PRN
  Administered 2023-05-06: 1 via ORAL
  Filled 2023-05-03: qty 296

## 2023-05-03 MED ORDER — SENNOSIDES-DOCUSATE SODIUM 8.6-50 MG PO TABS
1.0000 | ORAL_TABLET | Freq: Two times a day (BID) | ORAL | Status: DC
  Administered 2023-05-03 – 2023-05-08 (×9): 1 via ORAL
  Filled 2023-05-03 (×10): qty 1

## 2023-05-03 MED ORDER — GABAPENTIN 100 MG PO CAPS
100.0000 mg | ORAL_CAPSULE | Freq: Every day | ORAL | Status: DC
  Administered 2023-05-03 – 2023-05-09 (×6): 100 mg via ORAL
  Filled 2023-05-03 (×7): qty 1

## 2023-05-03 MED ORDER — CLOPIDOGREL BISULFATE 75 MG PO TABS: 75.0000 mg | ORAL_TABLET | Freq: Every day | ORAL | Status: DC

## 2023-05-03 MED ORDER — ASPIRIN 81 MG PO TBEC: 81.0000 mg | DELAYED_RELEASE_TABLET | Freq: Every day | ORAL | Status: DC

## 2023-05-03 MED ORDER — MELATONIN 5 MG PO TABS
5.0000 mg | ORAL_TABLET | Freq: Every evening | ORAL | Status: DC | PRN
  Administered 2023-05-09: 5 mg via ORAL
  Filled 2023-05-03: qty 1

## 2023-05-03 MED ORDER — ONDANSETRON HCL 4 MG/2ML IJ SOLN: 4.0000 mg | Freq: Four times a day (QID) | INTRAMUSCULAR | Status: DC | PRN

## 2023-05-03 NOTE — PMR Pre-admission (Signed)
 PMR Admission Coordinator Pre-Admission Assessment  Patient: Charles Benitez is an 48 y.o., male MRN: [id number] DOB: 1975/06/21 Height: 5\' 6"  (167.6 cm) Weight: 81.1 kg  Insurance Information HMO:     PPO:      PCP:      IPA:      80/20:      OTHER:  PRIMARY: uninsured      Policy#:       Subscriber:  CM Name:       Phone#:      Fax#:  Pre-Cert#:       Employer:  Benefits:  Phone #:      Name:  Eff. Date:      Deduct:       Out of Pocket Max:       Life Max:  CIR:       SNF:  Outpatient:      Co-Pay:  Home Health:       Co-Pay:  DME:      Co-Pay:  Providers:  SECONDARY:       Policy#:      Phone#:   Artist:       Phone#:   The Data processing manager" for patients in Inpatient Rehabilitation Facilities with attached "Privacy Act Statement-Health Care Records" was provided and verbally reviewed with: Patient  Emergency Contact Information Contact Information     Name Relation Home Work Mobile   Charles Benitez Mother [phone]        Other Contacts   None on File     Current Medical History  Patient Admitting Diagnosis: CVA   History of Present Illness: Pt is a 48 y/o with PMH of sciatica who presented to [facility] ED on 05/01/23 with c/o left sided headache, left facial numbness, and BUE paresthesias.  BP soft on arrival 103/82, vitals otherwise normal, labs notable for WBC 13.4, glucose 133, calcium 8.6.  CT head negative and neurology consulted with concern for posterior circulation CVA not seen on CT.   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  Hospital course BP management.  Neurology recommended asa 81 and plavix 75 QD x 3 weeks, then asa 81 monotherapy, hypercoag panel to be followed up on after d/c.  Therapy evaluations were completed and pt was recommended for CIR.   Complete NIHSS TOTAL: 1  Patient's medical record from [facility] has been reviewed by the rehabilitation admission coordinator and physician.  Past Medical  History  Past Medical History:  Diagnosis Date   Asthma    Chronic back pain     Has the patient had major surgery during 100 days prior to admission? No  Family History   family history is not on file.  Current Medications  Current Facility-Administered Medications:    acetaminophen (TYLENOL) tablet 650 mg, 650 mg, Oral, Q4H PRN **OR** acetaminophen (TYLENOL) 160 MG/5ML solution 650 mg, 650 mg, Per Tube, Q4H PRN **OR** acetaminophen (TYLENOL) suppository 650 mg, 650 mg, Rectal, Q4H PRN, Andris Baumann, MD   aspirin EC tablet 81 mg, 81 mg, Oral, Daily, Lindajo Royal V, MD, 81 mg at 05/03/23 8295   cyclobenzaprine (FLEXERIL) tablet 10 mg, 10 mg, Oral, TID PRN, Andris Baumann, MD   enoxaparin (LOVENOX) injection 40 mg, 40 mg, Subcutaneous, Q24H, Lindajo Royal V, MD, 40 mg at 05/03/23 6213   gabapentin (NEURONTIN) capsule 100 mg, 100 mg, Oral, QHS, Andris Baumann, MD, 100 mg at 05/02/23 2211  Patients Current Diet:  Diet Order  Diet regular Room service appropriate? Yes; Fluid consistency: Thin  Diet effective now                   Precautions / Restrictions Precautions Precautions: Fall Restrictions Weight Bearing Restrictions Per Provider Order: No   Has the patient had 2 or more falls or a fall with injury in the past year? No  Prior Activity Level Community (5-7x/wk): independent with no DME, driving, not currently working  Prior Functional Level Self Care: Did the patient need help bathing, dressing, using the toilet or eating? Independent  Indoor Mobility: Did the patient need assistance with walking from room to room (with or without device)? Independent  Stairs: Did the patient need assistance with internal or external stairs (with or without device)? Independent  Functional Cognition: Did the patient need help planning regular tasks such as shopping or remembering to take medications? Independent  Patient Information Are you of Hispanic,  Latino/a,or Spanish origin?: C. Yes, [place] What is your race?: Z. None of the above ("hispanic") Do you need or want an interpreter to communicate with a doctor or health care staff?: 0. No (declines need for interpreter)  Patient's Response To:  Health Literacy and Transportation Is the patient able to respond to health literacy and transportation needs?: Yes Health Literacy - How often do you need to have someone help you when you read instructions, pamphlets, or other written material from your doctor or pharmacy?: Never In the past 12 months, has lack of transportation kept you from medical appointments or from getting medications?: No In the past 12 months, has lack of transportation kept you from meetings, work, or from getting things needed for daily living?: No  Home Assistive Devices / Equipment Home Equipment: None  Prior Device Use: Indicate devices/aids used by the patient prior to current illness, exacerbation or injury? None of the above  Current Functional Level Cognition  Orientation Level: Oriented X4    Extremity Assessment (includes Sensation/Coordination)  Upper Extremity Assessment:  (grossly 4+/5 throughout; generalized paresthesia elbows distally bilat, R > L; finger-to-nose with mild delay, L > R) LUE Deficits / Details: Strength/ROM WFL, some ataxic movements appreciated with intentional movements/initiation. Will continue to monitor. Decreased FMC as compared to R.  Lower Extremity Assessment:  (grossly 4+/5 throughout with isolated MMT; denies sensory deficit, isolated proprioception intact.  Significant ataxia, coordination and functional propriceptive deficits in standing/gait) LLE Deficits / Details: Noted decreased smotheness of movement with intentional LLE movement, pt reports LLE feels weaker than usualy particularly when weight bearing. Tends to lean to L during functional tasks . Pt states it "feels like I'm being pulled to this side".    ADLs   Overall ADL's : Needs assistance/impaired General ADL Comments: MOD A for bed/functional mobility 2/2 significant L lateral lean and instability with upright positionign including sitting and standing balance. Able to stand at sink with MOD A and perform HH with min cueing for sequencing of task  (i.e to use soap). Noted to overshoot when using LUE for functional activity with decreased accuracy when throwing away paper towel and reaching for soap dispenser. Requires MOD A for static sitting balance at EOB to don bilat hospital socks. No physical assist to thread socks over feet, but requires increased time to perform.    Mobility  Overal bed mobility: Needs Assistance Bed Mobility: Supine to Sit, Sit to Supine Supine to sit: Contact guard Sit to supine: Contact guard assist General bed mobility comments: MOD A for  trunk elevation and to maintain initial upright posture when coming to sit at EOB.    Transfers  Overall transfer level: Needs assistance Equipment used: None Transfers: Sit to/from Stand Sit to Stand: Mod assist General transfer comment: L ant/lateral lean in standing, min/mod assist for correction    Ambulation / Gait / Stairs / Wheelchair Mobility  Ambulation/Gait Ambulation/Gait assistance: Mod assist Gait Distance (Feet):  (20' x2) Assistive device: None General Gait Details: reciprocal stepping pattern with marked impairments in functional control, placement and awareness of L LE with gait efforts.  Intermittent scissoring with clunky, labored contact to floor.  L lateral lean/weight shift worsens with divided attention and dynamic activity, consistent mod assist to maintain balance/prevent fall.  Does tend to reach to furniture with bilat UEs throughout gait trial, difficulty releasing grasp of objects with L UE during gait trial    Posture / Balance Dynamic Sitting Balance Sitting balance - Comments: L lateral lean, but does correct indep Balance Overall balance  assessment: Needs assistance Sitting-balance support: No upper extremity supported, Feet supported Sitting balance-Leahy Scale: Fair Sitting balance - Comments: L lateral lean, but does correct indep Postural control: Left lateral lean Standing balance support: Reliant on assistive device for balance, During functional activity, Single extremity supported, No upper extremity supported Standing balance-Leahy Scale: Poor    Special needs/care consideration Diabetic management yes   Previous Home Environment (from acute therapy documentation) Living Arrangements: Children, Parent Available Help at Discharge: Family, Available PRN/intermittently Type of Home: House Home Layout: Two level, Bed/bath upstairs Alternate Level Stairs-Rails: Right Alternate Level Stairs-Number of Steps: flight Home Access: Level entry Bathroom Shower/Tub: Engineer, manufacturing systems: Standard Home Care Services: No  Discharge Living Setting Plans for Discharge Living Setting: Patient's home, Lives with (comment) (mom and 58 y/o son) Type of Home at Discharge: House Discharge Home Layout: Two level, Bed/bath upstairs Alternate Level Stairs-Rails: Right Alternate Level Stairs-Number of Steps: flight Discharge Home Access: Level entry Discharge Bathroom Shower/Tub: Tub/shower unit Discharge Bathroom Toilet: Standard Discharge Bathroom Accessibility: No Does the patient have any problems obtaining your medications?: No  Social/Family/Support Systems Patient Roles: Parent Contact Information: 8 y/o son (lives with him full time), and 13 y/o daughter (part time) Anticipated Caregiver: mom, Courtney Paris Anticipated Caregiver's Contact Information: [phone] Ability/Limitations of Caregiver: supervision only Caregiver Availability: 24/7 Discharge Plan Discussed with Primary Caregiver: Yes Is Caregiver In Agreement with Plan?: Yes Does Caregiver/Family have Issues with Lodging/Transportation while Pt is  in Rehab?: No  Goals Patient/Family Goal for Rehab: PT/OT supervision to mod I, SLP mod I Expected length of stay: 12-14 days Additional Information: Discharge plan: return to pt's home with mother to provide 24/7 supervision if needed Pt/Family Agrees to Admission and willing to participate: Yes Program Orientation Provided & Reviewed with Pt/Caregiver Including Roles  & Responsibilities: Yes  Decrease burden of Care through IP rehab admission: n/a  Possible need for SNF placement upon discharge:  Not anticipated, plan for discharge to pt's home where his mother can provide 24/7 supervision.   Patient Condition: I have reviewed medical records from [facility], spoken with CM, and patient. I discussed via phone for inpatient rehabilitation assessment.  Patient will benefit from ongoing PT, OT, and SLP, can actively participate in 3 hours of therapy a day 5 days of the week, and can make measurable gains during the admission.  Patient will also benefit from the coordinated team approach during an Inpatient Acute Rehabilitation admission.  The patient will receive intensive therapy as well  as Rehabilitation physician, nursing, social worker, and care management interventions.  Due to safety, disease management, medication administration, and patient education the patient requires 24 hour a day rehabilitation nursing.  The patient is currently min to mod assist with mobility and basic ADLs.  Discharge setting and therapy post discharge at home with home health is anticipated.  Patient has agreed to participate in the Acute Inpatient Rehabilitation Program and will admit today.  Preadmission Screen Completed By:  Stephania Fragmin, PT, DPT 05/03/2023 10:56 AM ______________________________________________________________________   Discussed status with Dr. Shearon Stalls on 05/03/23  at 11:10 AM  and received approval for admission today.  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 Yes Co-Morbidities requiring supervision/potential complications: CVA, Hypertension, bradycardia, CAD, poorly controlled bilateral low back pain with sciatica Due to safety, skin/wound care, disease management, medication administration, pain management, and patient education, does the patient require 24 hr/day rehab nursing? Yes Does the patient require coordinated care of a physician, rehab nurse, PT, OT, and SLP to address physical and functional deficits in the context of the above medical diagnosis(es)? Yes Addressing deficits in the following areas: balance, endurance, locomotion, strength, transferring, bowel/bladder control, bathing, dressing, feeding, grooming, toileting, cognition, speech, and language Can the patient actively participate in an intensive therapy program of at least 3 hrs of therapy 5 days a week? Yes The potential for patient to make measurable gains while on inpatient rehab is good Anticipated functional outcomes upon discharge from inpatient rehab: modified independent PT, modified independent OT, modified independent SLP Estimated rehab length of stay to reach the above functional goals is: 12-14 days Anticipated discharge destination: Home 10. Overall Rehab/Functional Prognosis: good   MD Signature:  [name], DO 05/03/2023

## 2023-05-03 NOTE — Progress Notes (Signed)
 Inpatient Rehabilitation Admission Medication Review by a Pharmacist  A complete drug regimen review was completed for this patient to identify any potential clinically significant medication issues.  High Risk Drug Classes Is patient taking? Indication by Medication  Antipsychotic No   Anticoagulant Yes Enoxaparin - VTE ppx  Antibiotic No   Opioid No   Antiplatelet Yes Aspirin, clopidogrel - stroke ppx  Hypoglycemics/insulin No   Vasoactive Medication No   Chemotherapy No   Other Yes Gabapentin - neuropathy Senokot-S, bisacodyl- constipation Diphenhydramine - PRN itching Robitussin DM - PRN cough Meclizine - PRN dizziness Melatonin - PRN sleep Methocarbamol - PRN muscle spasms Zofran - PRN nausea/vomiting     Type of Medication Issue Identified Description of Issue Recommendation(s)  Drug Interaction(s) (clinically significant)     Duplicate Therapy     Allergy     No Medication Administration End Date     Incorrect Dose     Additional Drug Therapy Needed     Significant med changes from prior encounter (inform family/care partners about these prior to discharge).  Communicate relevant medication changes to patient/family members at discharge from CIR.   Restart or discontinue PTA meds not resumed in CIR at discharge if clinically indicated.   Other       Clinically significant medication issues were identified that warrant physician communication and completion of prescribed/recommended actions by midnight of the next day:  No   Pharmacist comments:  Per Neurology 2/27 note recommendations: ASA 81mg  daily + plavix 75mg  daily x21 days f/b ASA 81mg  daily monotherapy after that   Time spent performing this drug regimen review (minutes): 20   Thank you for allowing pharmacy to be a part of this patient's care.  Thelma Barge, PharmD, BCPS Clinical Pharmacist

## 2023-05-03 NOTE — H&P (Addendum)
 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    HPI: Charles Benitez is a 48 year old male in his usual state of excellent health who presented to the emergency department on 05/01/2023 complaining of left-sided headache with pain behind the left eye, left facial numbness and paresthesias of both hands with unsteady gait.  He was outside tPA window.  Symptoms improved upon arrival.  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  Neurology evaluated and advised DAPT for 21 days followed by aspirin monotherapy.  His LDL was less than 70 so statin therapy was not initiated.  Hypercoagulable panel ordered and the results are pending.  Zio patch placed.  TEE and OT evaluations obtained.  The patient lives with his mother and 55 year old son and also provides care for his 78-year-old daughter regularly.  During OT session, he demonstrated impaired ADL performance and functional mobility secondary to decreased functional use of his left upper and left lower extremities.  He demonstrates decreased balance, decreased safety awareness and limited cognition in setting of his stroke.  Is tolerating regular diet.  Hemoglobin A1c 5.3%.  Echocardiogram with bubble study: EF estimated 60 to 65%, no atrial level shunt, no evidence of patent foramen ovale.  No evidence of atrial septal defect.  The patient requires inpatient medicine and rehabilitation evaluations and services for ongoing dysfunction secondary to left lateral medullary infarct. ROS:  - CONSTITUTIONAL: Denies weight loss, fever and chills.  - HEENT: Denies changes in vision and hearing.  - RESPIRATORY: Denies SOB and cough.  - CV: Denies palpitations and CP.  - GI: Denies abdominal pain, nausea, vomiting and diarrhea. +constiptation  - GU: Denies dysuria and urinary frequency.  - MSK: Denies myalgia and joint pain.  - SKIN: Denies rash and pruritus.  -  NEUROLOGICAL: + headache, + dizziness, + hemisensory loss, + weakness  - PSYCHIATRIC: Denies recent changes in mood. Denies anxiety and depression.       Past Medical History:  Diagnosis Date   Asthma     Chronic back pain          History reviewed. No pertinent surgical history.     History reviewed. No pertinent family history.     Social History:  reports that he has been smoking cigarettes. He has never used smokeless tobacco. He reports current alcohol use. He reports current drug use. Drug: Marijuana. Allergies:  Allergies      Allergies  Allergen Reactions   Shrimp [Shellfish Allergy] Anaphylaxis            Medications Prior to Admission  Medication Sig Dispense Refill   cyclobenzaprine (FLEXERIL) 10 MG tablet Take 1 tablet (10 mg total) by mouth 3 (three) times daily as needed for muscle spasms. (Patient not taking: Reported on 05/02/2023) 60 tablet 2   gabapentin (NEURONTIN) 100 MG capsule Take 1 capsule (100 mg total) by mouth at bedtime. (Patient not taking: Reported on 05/02/2023) 90 capsule 1   lidocaine-hydrocortisone (ANAMANTLE) 3-1 % KIT Place 1 Application rectally 2 (two) times daily. (Patient not taking: Reported on 05/02/2023) 1 kit 0   predniSONE (DELTASONE) 10 MG tablet Take 6 tablets  today, on day 2 take 5 tablets, day 3 take 4 tablets, day 4 take 3 tablets, day 5 take  2 tablets and 1 tablet the last day (Patient not taking: Reported on 05/02/2023) 21 tablet 0  Home: Home Living Family/patient expects to be discharged to:: Private residence Living Arrangements: Children, Parent Available Help at Discharge: Family, Available PRN/intermittently Type of Home: House Home Access: Level entry Home Layout: Two level, Bed/bath upstairs Alternate Level Stairs-Number of Steps: flight Alternate Level Stairs-Rails: Right Bathroom Shower/Tub: Engineer, manufacturing systems: Standard Home Equipment: None   Functional History: Prior  Function Prior Level of Function : Independent/Modified Independent, Driving Mobility Comments: Independent, denies falls history ADLs Comments: Independent with ADL/IADLs, cares for children, not currently working but hopeful he can get back to it soon.   Functional Status:  Mobility: Bed Mobility Overal bed mobility: Needs Assistance Bed Mobility: Supine to Sit, Sit to Supine Supine to sit: Contact guard Sit to supine: Contact guard assist General bed mobility comments: MOD A for trunk elevation and to maintain initial upright posture when coming to sit at EOB. Transfers Overall transfer level: Needs assistance Equipment used: None Transfers: Sit to/from Stand Sit to Stand: Mod assist General transfer comment: L ant/lateral lean in standing, min/mod assist for correction Ambulation/Gait Ambulation/Gait assistance: Mod assist Gait Distance (Feet):  (20' x2) Assistive device: None General Gait Details: reciprocal stepping pattern with marked impairments in functional control, placement and awareness of L LE with gait efforts.  Intermittent scissoring with clunky, labored contact to floor.  L lateral lean/weight shift worsens with divided attention and dynamic activity, consistent mod assist to maintain balance/prevent fall.  Does tend to reach to furniture with bilat UEs throughout gait trial, difficulty releasing grasp of objects with L UE during gait trial   ADL: ADL Overall ADL's : Needs assistance/impaired General ADL Comments: MOD A for bed/functional mobility 2/2 significant L lateral lean and instability with upright positionign including sitting and standing balance. Able to stand at sink with MOD A and perform HH with min cueing for sequencing of task  (i.e to use soap). Noted to overshoot when using LUE for functional activity with decreased accuracy when throwing away paper towel and reaching for soap dispenser. Requires MOD A for static sitting balance at EOB to don bilat  hospital socks. No physical assist to thread socks over feet, but requires increased time to perform.   Cognition: Cognition Orientation Level: Oriented X4 Cognition Arousal: Alert Behavior During Therapy: WFL for tasks assessed/performed, Impulsive   Physical Exam: Blood pressure (!) 142/97, pulse (!) 54, temperature 97.8 F (36.6 C), temperature source Oral, resp. rate 15, height 5\' 6"  (1.676 m), weight 81.1 kg, SpO2 99%. Physical Exam Constitutional: No apparent distress. Appropriate appearance for age.  Laying in bed. HENT: No JVD. Neck Supple. Trachea midline. Atraumatic, normocephalic. Eyes: PERRLA. EOMI. Visual fields grossly intact.  + Left beating nystagmus on rightward gaze  Cardiovascular: RRR, no murmurs/rub/gallops. No Edema. Peripheral pulses 2+  Respiratory: CTAB. No rales, rhonchi, or wheezing. On RA.  Abdomen: + bowel sounds, normoactive. No distention or tenderness.  Skin: C/D/I. No apparent lesions. MSK:      No apparent deformity.       Neurologic exam:  Cognition: AAO to person, place, time and event.  Language: Fluent, No substitutions or neoglisms. No dysarthria. Names 3/3 objects correctly.  Memory: Recalls 2/3 objects at 5 minutes. No apparent deficits  Insight: Good  insight into current condition.  Mood: Pleasant affect, appropriate mood.  Sensation: Right hemisensory loss to light touch, pain, and temperature RUE>RLE Reflexes: 2+ in BL UE and LEs. Negative Hoffman's and babinski signs bilaterally.  CN: + R V1-3 sensory loss.  Coordination: Mild RUE drift. No  apparent tremors. No ataxia on FTN. LLE ataxia on HTS.  Spasticity: MAS 0 in all extremities.       Strength:                RUE: 5/5 SA, 5/5 EF, 5/5 EE, 5/5 WE, 5/5 FF, 5/5 FA                LUE:  5-/5 SA, 5/5 EF, 5/5 EE, 5/5 WE, 5/5 FF, 5-/5 FA                RLE: 4/5 HF, 5/5 KE, 5/5  DF, 5/5  EHL, 5/5  PF                 LLE:  4/5 HF, 5/5 KE, 5/5  DF, 5/5  EHL, 5/5  PF       Lab Results  Last 48 Hours        Results for orders placed or performed during the hospital encounter of 05/01/23 (from the past 48 hours)  CBG monitoring, ED     Status: None    Collection Time: 05/01/23  5:15 PM  Result Value Ref Range    Glucose-Capillary 84 70 - 99 mg/dL      Comment: Glucose reference range applies only to samples taken after fasting for at least 8 hours.    Comment 1 Notify RN      Comment 2 Document in Chart    Protime-INR     Status: None    Collection Time: 05/01/23  5:15 PM  Result Value Ref Range    Prothrombin Time 13.2 11.4 - 15.2 seconds    INR 1.0 0.8 - 1.2      Comment: (NOTE) INR goal varies based on device and disease states. Performed at [facility], [address] [place], [place] [address]    APTT     Status: None    Collection Time: 05/01/23  5:15 PM  Result Value Ref Range    aPTT 24 24 - 36 seconds      Comment: Performed at [facility], [address] [place], [place] [address]  CBC     Status: Abnormal    Collection Time: 05/01/23  5:15 PM  Result Value Ref Range    WBC 13.4 (H) 4.0 - 10.5 K/uL    RBC 5.46 4.22 - 5.81 MIL/uL    Hemoglobin 16.7 13.0 - 17.0 g/dL    HCT 09.8 11.9 - 14.7 %    MCV 91.4 80.0 - 100.0 fL    MCH 30.6 26.0 - 34.0 pg    MCHC 33.5 30.0 - 36.0 g/dL    RDW 82.9 56.2 - 13.0 %    Platelets 341 150 - 400 K/uL    nRBC 0.0 0.0 - 0.2 %      Comment: Performed at [facility], [address] [place], [place] [address]  Differential     Status: Abnormal    Collection Time: 05/01/23  5:15 PM  Result Value Ref Range    Neutrophils Relative % 76 %    Neutro Abs 10.4 (H) 1.7 - 7.7 K/uL    Lymphocytes Relative 15 %    Lymphs Abs 2.0 0.7 - 4.0 K/uL    Monocytes Relative 6 %    Monocytes Absolute 0.8 0.1 - 1.0 K/uL    Eosinophils Relative 1 %    Eosinophils Absolute 0.1 0.0 - 0.5 K/uL    Basophils Relative 1 %  Basophils Absolute 0.1 0.0 - 0.1 K/uL    Immature Granulocytes 1 %    Abs Immature  Granulocytes 0.07 0.00 - 0.07 K/uL      Comment: Performed at [facility], [address] [place], [place] [address]  Comprehensive metabolic panel     Status: Abnormal    Collection Time: 05/01/23  5:15 PM  Result Value Ref Range    Sodium 138 135 - 145 mmol/L    Potassium 4.3 3.5 - 5.1 mmol/L    Chloride 107 98 - 111 mmol/L    CO2 25 22 - 32 mmol/L    Glucose, Bld 133 (H) 70 - 99 mg/dL      Comment: Glucose reference range applies only to samples taken after fasting for at least 8 hours.    BUN 14 6 - 20 mg/dL    Creatinine, Ser 6.04 0.61 - 1.24 mg/dL    Calcium 8.6 (L) 8.9 - 10.3 mg/dL    Total Protein 6.3 (L) 6.5 - 8.1 g/dL    Albumin 3.3 (L) 3.5 - 5.0 g/dL    AST 16 15 - 41 U/L    ALT 23 0 - 44 U/L    Alkaline Phosphatase 72 38 - 126 U/L    Total Bilirubin 0.3 0.0 - 1.2 mg/dL    GFR, Estimated >54 >09 mL/min      Comment: (NOTE) Calculated using the CKD-EPI Creatinine Equation (2021)      Anion gap 6 5 - 15      Comment: Performed at [facility], [address] [place], [place] [address]  Ethanol     Status: None    Collection Time: 05/01/23  5:15 PM  Result Value Ref Range    Alcohol, Ethyl (B) <10 <10 mg/dL      Comment: (NOTE) Lowest detectable limit for serum alcohol is 10 mg/dL.   For medical purposes only. Performed at [facility], [address] [place], [place] [address]    Hemoglobin A1c     Status: None    Collection Time: 05/01/23  5:15 PM  Result Value Ref Range    Hgb A1c MFr Bld 5.3 4.8 - 5.6 %      Comment: (NOTE) Pre diabetes:          5.7%-6.4%   Diabetes:              >6.4%   Glycemic control for   <7.0% adults with diabetes      Mean Plasma Glucose 105.41 mg/dL      Comment: Performed at [facility], [address], [place], [place] [address]  Lipid panel     Status: Abnormal    Collection Time: 05/02/23  5:15 AM  Result Value Ref Range    Cholesterol 139 0 - 200 mg/dL    Triglycerides 086 (H)  <150 mg/dL    HDL 24 (L) >57 mg/dL    Total CHOL/HDL Ratio 5.8 RATIO    VLDL 48 (H) 0 - 40 mg/dL    LDL Cholesterol 67 0 - 99 mg/dL      Comment:        Total Cholesterol/HDL:CHD Risk Coronary Heart Disease Risk Table                     Men   Women  1/2 Average Risk   3.4   3.3  Average Risk       5.0   4.4  2  X Average Risk   9.6   7.1  3 X Average Risk  23.4   11.0        Use the calculated Patient Ratio above and the CHD Risk Table to determine the patient's CHD Risk.        ATP III CLASSIFICATION (LDL):  <100     mg/dL   Optimal  295-621  mg/dL   Near or Above                    Optimal  130-159  mg/dL   Borderline  308-657  mg/dL   High  >846     mg/dL   Very High Performed at [facility], [address] [place], [place] [address]    HIV Antibody (routine testing w rflx)     Status: None    Collection Time: 05/02/23  5:15 AM  Result Value Ref Range    HIV Screen 4th Generation wRfx Non Reactive Non Reactive      Comment: Performed at [facility], [address], [place], [place] [address]  Fibrinogen (coagulopathy lab panel)     Status: None    Collection Time: 05/02/23  6:10 AM  Result Value Ref Range    Fibrinogen 300 210 - 475 mg/dL      Comment: (NOTE) Fibrinogen results may be underestimated in patients receiving thrombolytic therapy. Performed at [facility], [address], [place], [place] [address]    Platelet count (coagulopathy lab panel)     Status: None    Collection Time: 05/02/23  6:10 AM  Result Value Ref Range    Platelets 294 150 - 400 K/uL      Comment: Performed at [facility], [address] [place], [place] [address]  Hemoglobin and hematocrit, blood (coagulopathy lab panel)     Status: None    Collection Time: 05/02/23  6:10 AM  Result Value Ref Range    Hemoglobin 15.4 13.0 - 17.0 g/dL    HCT 36.6 44.0 - 34.7 %      Comment: Performed at [facility], [address] [place],  [place] [address]  APTT (coagulopathy lab panel)     Status: None    Collection Time: 05/02/23  6:10 AM  Result Value Ref Range    aPTT 27 24 - 36 seconds      Comment: Performed at [facility], [address], [place], [place] [address]       Imaging Results (Last 48 hours)  ECHOCARDIOGRAM COMPLETE BUBBLE STUDY Result Date: 05/02/2023    ECHOCARDIOGRAM REPORT   Patient Name:   ZACARIAS KRAUTER Date of Exam: 05/02/2023 Medical Rec #:  [id number]        Height:       66.0 in Accession #:    [id number]       Weight:       178.8 lb Date of Birth:  29-May-1975        BSA:          1.907 m Patient Age:    48 years         BP:           130/74 mmHg Patient Gender: M                HR:           55 bpm. Exam Location:  [facility] Procedure: 2D Echo, Cardiac Doppler, Color Doppler, Strain Analysis and  Saline            Contrast Bubble Study (Both Spectral and Color Flow Doppler were            utilized during procedure). Indications:     Stroke  History:         Patient has no prior history of Echocardiogram examinations.                  Stroke.  Sonographer:     Mikki Harbor Referring Phys:  [id number] NOAH BEDFORD EAVW Diagnosing Phys: Julien Nordmann MD  Sonographer Comments: Image acquisition challenging due to respiratory motion. Global longitudinal strain was attempted. IMPRESSIONS  1. Left ventricular ejection fraction, by estimation, is 60 to 65%. The left ventricle has normal function. The left ventricle has no regional wall motion abnormalities. Left ventricular diastolic parameters were normal. The average left ventricular global longitudinal strain is -20.6 %. The global longitudinal strain is normal.  2. Right ventricular systolic function is normal. The right ventricular size is normal. There is normal pulmonary artery systolic pressure.  3. The mitral valve is normal in structure. No evidence of mitral valve regurgitation. No evidence of mitral stenosis.  4. The aortic valve is normal in structure. Aortic  valve regurgitation is not visualized. No aortic stenosis is present.  5. The inferior vena cava is normal in size with greater than 50% respiratory variability, suggesting right atrial pressure of 3 mmHg.  6. Agitated saline contrast bubble study was negative, with no evidence of any interatrial shunt. FINDINGS  Left Ventricle: Left ventricular ejection fraction, by estimation, is 60 to 65%. The left ventricle has normal function. The left ventricle has no regional wall motion abnormalities. The average left ventricular global longitudinal strain is -20.6 %. Strain was performed and the global longitudinal strain is normal. The left ventricular internal cavity size was normal in size. There is no left ventricular hypertrophy. Left ventricular diastolic parameters were normal. Right Ventricle: The right ventricular size is normal. No increase in right ventricular wall thickness. Right ventricular systolic function is normal. There is normal pulmonary artery systolic pressure. The tricuspid regurgitant velocity is 2.38 m/s, and  with an assumed right atrial pressure of 3 mmHg, the estimated right ventricular systolic pressure is 25.7 mmHg. Left Atrium: Left atrial size was normal in size. Right Atrium: Right atrial size was normal in size. Pericardium: There is no evidence of pericardial effusion. Mitral Valve: The mitral valve is normal in structure. No evidence of mitral valve regurgitation. No evidence of mitral valve stenosis. MV peak gradient, 3.9 mmHg. The mean mitral valve gradient is 1.0 mmHg. Tricuspid Valve: The tricuspid valve is normal in structure. Tricuspid valve regurgitation is mild . No evidence of tricuspid stenosis. Aortic Valve: The aortic valve is normal in structure. Aortic valve regurgitation is not visualized. No aortic stenosis is present. Aortic valve mean gradient measures 4.0 mmHg. Aortic valve peak gradient measures 8.4 mmHg. Aortic valve area, by VTI measures 2.79 cm. Pulmonic Valve:  The pulmonic valve was normal in structure. Pulmonic valve regurgitation is not visualized. No evidence of pulmonic stenosis. Aorta: The aortic root is normal in size and structure. Venous: The inferior vena cava is normal in size with greater than 50% respiratory variability, suggesting right atrial pressure of 3 mmHg. IAS/Shunts: No atrial level shunt detected by color flow Doppler. Agitated saline contrast was given intravenously to evaluate for intracardiac shunting. Agitated saline contrast bubble study was negative, with no evidence of any interatrial shunt.  There  is no evidence of a patent foramen ovale. There is no evidence of an atrial septal defect. Additional Comments: 3D imaging was not performed.  LEFT VENTRICLE PLAX 2D LVIDd:         4.70 cm     Diastology LVIDs:         2.80 cm     LV e' medial:    7.62 cm/s LV PW:         1.10 cm     LV E/e' medial:  11.0 LV IVS:        1.10 cm     LV e' lateral:   10.90 cm/s LVOT diam:     2.00 cm     LV E/e' lateral: 7.7 LV SV:         88 LV SV Index:   46          2D Longitudinal Strain LVOT Area:     3.14 cm    2D Strain GLS Avg:     -20.6 %  LV Volumes (MOD) LV vol d, MOD A2C: 52.9 ml LV vol d, MOD A4C: 58.0 ml LV vol s, MOD A2C: 12.3 ml LV vol s, MOD A4C: 22.1 ml LV SV MOD A2C:     40.6 ml LV SV MOD A4C:     58.0 ml LV SV MOD BP:      39.0 ml RIGHT VENTRICLE RV Basal diam:  3.40 cm RV Mid diam:    3.10 cm RV S prime:     14.30 cm/s TAPSE (M-mode): 2.1 cm LEFT ATRIUM             Index        RIGHT ATRIUM           Index LA diam:        3.90 cm 2.05 cm/m   RA Area:     14.10 cm LA Vol (A2C):   50.5 ml 26.48 ml/m  RA Volume:   29.80 ml  15.63 ml/m LA Vol (A4C):   40.7 ml 21.34 ml/m LA Biplane Vol: 45.6 ml 23.91 ml/m  AORTIC VALVE                    PULMONIC VALVE AV Area (Vmax):    2.99 cm     PV Vmax:       1.12 m/s AV Area (Vmean):   2.73 cm     PV Peak grad:  5.0 mmHg AV Area (VTI):     2.79 cm AV Vmax:           145.00 cm/s AV Vmean:          95.600  cm/s AV VTI:            0.315 m AV Peak Grad:      8.4 mmHg AV Mean Grad:      4.0 mmHg LVOT Vmax:         138.00 cm/s LVOT Vmean:        83.100 cm/s LVOT VTI:          0.280 m LVOT/AV VTI ratio: 0.89  AORTA Ao Root diam: 3.30 cm MITRAL VALVE               TRICUSPID VALVE MV Area (PHT): 4.04 cm    TR Peak grad:   22.7 mmHg MV Area VTI:   3.06 cm    TR Vmax:        238.00 cm/s  MV Peak grad:  3.9 mmHg MV Mean grad:  1.0 mmHg    SHUNTS MV Vmax:       0.99 m/s    Systemic VTI:  0.28 m MV Vmean:      53.5 cm/s   Systemic Diam: 2.00 cm MV Decel Time: 188 msec MV E velocity: 84.20 cm/s MV A velocity: 66.90 cm/s MV E/A ratio:  1.26 Julien Nordmann MD Electronically signed by Julien Nordmann MD Signature Date/Time: 05/02/2023/4:27:23 PM    Final     MR Cervical Spine Wo Contrast Result Date: 05/02/2023 CLINICAL DATA:  Initial evaluation for acute ataxia. EXAM: MRI CERVICAL SPINE WITHOUT CONTRAST TECHNIQUE: Multiplanar, multisequence MR imaging of the cervical spine was performed. No intravenous contrast was administered. COMPARISON:  Prior radiograph from 10/26/2015. FINDINGS: Alignment: Straightening of the normal cervical lordosis. No listhesis. Vertebrae: Vertebral body height maintained without acute or chronic fracture. Decreased T1 signal intensity noted throughout the visualized bone marrow, nonspecific, but most commonly related to anemia, smoking or obesity. No discrete or worrisome osseous lesions. No abnormal marrow edema. Cord: Normal signal and morphology. Posterior Fossa, vertebral arteries, paraspinal tissues: Unremarkable. Disc levels: C2-C3: Unremarkable. C3-C4: Disc desiccation with mild disc bulge. Mild bilateral uncovertebral spurring. No spinal stenosis. Foramina remain patent. C4-C5: Mild disc bulge with uncovertebral spurring. No spinal stenosis. Mild left C5 foraminal narrowing. Right neural foramen remains patent. C5-C6: Mild disc bulge with uncovertebral spurring. Flattening and partial effacement  of the ventral thecal sac with resultant mild spinal stenosis. Moderate left with mild right C6 foraminal narrowing. C6-C7: Left eccentric disc bulge with left-sided uncovertebral spurring. No significant spinal stenosis. Mild left C7 foraminal narrowing. Right neural foramina remains patent. C7-T1: Negative interspace. Mild right-sided facet hypertrophy. No canal or foraminal stenosis. IMPRESSION: 1. Normal MRI appearance of the cervical spinal cord. 2. Mild multilevel cervical spondylosis with resultant mild spinal stenosis at C5-6. 3. Mild left C5, moderate left C6, and mild left C7 foraminal narrowing related to disc bulge and uncovertebral disease. Electronically Signed   By: Rise Mu M.D.   On: 05/02/2023 00:38    MR BRAIN WO CONTRAST Result Date: 05/02/2023 CLINICAL DATA:  Initial evaluation for acute headache, neuro deficit. EXAM: MRI HEAD WITHOUT CONTRAST MRA HEAD WITHOUT CONTRAST MRA NECK WITHOUT AND WITH CONTRAST TECHNIQUE: Multiplanar, multi-echo pulse sequences of the brain and surrounding structures were acquired without intravenous contrast. Angiographic images of the Circle of Willis were acquired using MRA technique without intravenous contrast. Angiographic images of the neck were acquired using MRA technique without and with intravenous contrast. Carotid stenosis measurements (when applicable) are obtained utilizing NASCET criteria, using the distal internal carotid diameter as the denominator. CONTRAST:  8mL GADAVIST GADOBUTROL 1 MMOL/ML IV SOLN COMPARISON:  None Available. FINDINGS: MRI HEAD FINDINGS Brain: Cerebral volume within normal limits. No significant cerebral white matter disease for age. No other focal parenchymal signal abnormality. 6 mm focus of restricted diffusion seen involving the lower left lateral medulla (series 5, image 3), consistent with an acute ischemic infarct. No associated hemorrhage or mass effect. No other evidence for acute or subacute ischemia. No  areas of chronic cortical infarction. No acute or chronic intracranial blood products. No mass lesion, midline shift or mass effect. No hydrocephalus or extra-axial fluid collection. Pituitary gland within normal limits. Vascular: Major intracranial vascular flow voids are maintained. Skull and upper cervical spine: Craniocervical junction within normal limits. Decreased T1 signal intensity noted within the bone marrow the visualized upper cervical spine, nonspecific, but most  commonly related to anemia, smoking, or obesity. No scalp soft tissue abnormality. Sinuses/Orbits: Left gaze noted. Paranasal sinuses are largely clear. No mastoid effusion. Other: None. MRA HEAD FINDINGS Anterior circulation: Both internal carotid arteries are widely patent through the siphons without stenosis. A1 segments patent bilaterally. Normal anterior communicating artery complex. Anterior cerebral arteries patent without stenosis. No M1 stenosis or occlusion. Distal MCA branches perfused and symmetric. Posterior circulation: Visualized distal V4 segments are patent without stenosis. Left vertebral artery dominant. Neither PICA origin visualized on this portion of the exam. Basilar patent without stenosis. Superior cerebral arteries patent bilaterally. Both PCAs primarily supplied via the basilar. PCAs are patent to their distal aspects without significant stenosis. Anatomic variants: As above.  No aneurysm. MRA NECK FINDINGS Aortic arch: Examination technically limited by motion artifact. Additionally, no time-of-flight sequence is available for review at time of this dictation. Visualized aortic arch within normal limits for caliber with standard branch pattern. No visible stenosis about the origin the great vessels. Right carotid system: Right common and internal carotid arteries are patent within the neck. No visible stenosis or dissection. Left carotid system: Left common and internal carotid arteries are patent within the neck.  No visible stenosis or dissection. Vertebral arteries: Both vertebral arteries appear to arise from the subclavian arteries. No visible proximal subclavian artery stenosis. Neither vertebral artery origin well visualized on this motion degraded exam. Left vertebral artery dominant. Vertebral arteries patent within the neck without visible stenosis or dissection. Other: None IMPRESSION: MRI HEAD: 1. 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 2. Otherwise normal brain MRI for age. MRA HEAD: Normal intracranial MRA. No large vessel occlusion, hemodynamically significant stenosis, or other acute vascular abnormality. MRA NECK: 1. Motion degraded exam. 2. Grossly negative MRA of the neck. No hemodynamically significant stenosis or other acute vascular abnormality. Electronically Signed   By: Rise Mu M.D.   On: 05/02/2023 00:33    MR Angiogram Neck W or Wo Contrast Result Date: 05/02/2023 CLINICAL DATA:  Initial evaluation for acute headache, neuro deficit. EXAM: MRI HEAD WITHOUT CONTRAST MRA HEAD WITHOUT CONTRAST MRA NECK WITHOUT AND WITH CONTRAST TECHNIQUE: Multiplanar, multi-echo pulse sequences of the brain and surrounding structures were acquired without intravenous contrast. Angiographic images of the Circle of Willis were acquired using MRA technique without intravenous contrast. Angiographic images of the neck were acquired using MRA technique without and with intravenous contrast. Carotid stenosis measurements (when applicable) are obtained utilizing NASCET criteria, using the distal internal carotid diameter as the denominator. CONTRAST:  8mL GADAVIST GADOBUTROL 1 MMOL/ML IV SOLN COMPARISON:  None Available. FINDINGS: MRI HEAD FINDINGS Brain: Cerebral volume within normal limits. No significant cerebral white matter disease for age. No other focal parenchymal signal abnormality. 6 mm focus of restricted diffusion seen involving the lower left lateral medulla (series 5,  image 3), consistent with an acute ischemic infarct. No associated hemorrhage or mass effect. No other evidence for acute or subacute ischemia. No areas of chronic cortical infarction. No acute or chronic intracranial blood products. No mass lesion, midline shift or mass effect. No hydrocephalus or extra-axial fluid collection. Pituitary gland within normal limits. Vascular: Major intracranial vascular flow voids are maintained. Skull and upper cervical spine: Craniocervical junction within normal limits. Decreased T1 signal intensity noted within the bone marrow the visualized upper cervical spine, nonspecific, but most commonly related to anemia, smoking, or obesity. No scalp soft tissue abnormality. Sinuses/Orbits: Left gaze noted. Paranasal sinuses are largely clear. No mastoid effusion. Other: None.  MRA HEAD FINDINGS Anterior circulation: Both internal carotid arteries are widely patent through the siphons without stenosis. A1 segments patent bilaterally. Normal anterior communicating artery complex. Anterior cerebral arteries patent without stenosis. No M1 stenosis or occlusion. Distal MCA branches perfused and symmetric. Posterior circulation: Visualized distal V4 segments are patent without stenosis. Left vertebral artery dominant. Neither PICA origin visualized on this portion of the exam. Basilar patent without stenosis. Superior cerebral arteries patent bilaterally. Both PCAs primarily supplied via the basilar. PCAs are patent to their distal aspects without significant stenosis. Anatomic variants: As above.  No aneurysm. MRA NECK FINDINGS Aortic arch: Examination technically limited by motion artifact. Additionally, no time-of-flight sequence is available for review at time of this dictation. Visualized aortic arch within normal limits for caliber with standard branch pattern. No visible stenosis about the origin the great vessels. Right carotid system: Right common and internal carotid arteries are  patent within the neck. No visible stenosis or dissection. Left carotid system: Left common and internal carotid arteries are patent within the neck. No visible stenosis or dissection. Vertebral arteries: Both vertebral arteries appear to arise from the subclavian arteries. No visible proximal subclavian artery stenosis. Neither vertebral artery origin well visualized on this motion degraded exam. Left vertebral artery dominant. Vertebral arteries patent within the neck without visible stenosis or dissection. Other: None IMPRESSION: MRI HEAD: 1. 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 2. Otherwise normal brain MRI for age. MRA HEAD: Normal intracranial MRA. No large vessel occlusion, hemodynamically significant stenosis, or other acute vascular abnormality. MRA NECK: 1. Motion degraded exam. 2. Grossly negative MRA of the neck. No hemodynamically significant stenosis or other acute vascular abnormality. Electronically Signed   By: Rise Mu M.D.   On: 05/02/2023 00:33    MR ANGIO HEAD WO CONTRAST Result Date: 05/02/2023 CLINICAL DATA:  Initial evaluation for acute headache, neuro deficit. EXAM: MRI HEAD WITHOUT CONTRAST MRA HEAD WITHOUT CONTRAST MRA NECK WITHOUT AND WITH CONTRAST TECHNIQUE: Multiplanar, multi-echo pulse sequences of the brain and surrounding structures were acquired without intravenous contrast. Angiographic images of the Circle of Willis were acquired using MRA technique without intravenous contrast. Angiographic images of the neck were acquired using MRA technique without and with intravenous contrast. Carotid stenosis measurements (when applicable) are obtained utilizing NASCET criteria, using the distal internal carotid diameter as the denominator. CONTRAST:  8mL GADAVIST GADOBUTROL 1 MMOL/ML IV SOLN COMPARISON:  None Available. FINDINGS: MRI HEAD FINDINGS Brain: Cerebral volume within normal limits. No significant cerebral white matter disease for age.  No other focal parenchymal signal abnormality. 6 mm focus of restricted diffusion seen involving the lower left lateral medulla (series 5, image 3), consistent with an acute ischemic infarct. No associated hemorrhage or mass effect. No other evidence for acute or subacute ischemia. No areas of chronic cortical infarction. No acute or chronic intracranial blood products. No mass lesion, midline shift or mass effect. No hydrocephalus or extra-axial fluid collection. Pituitary gland within normal limits. Vascular: Major intracranial vascular flow voids are maintained. Skull and upper cervical spine: Craniocervical junction within normal limits. Decreased T1 signal intensity noted within the bone marrow the visualized upper cervical spine, nonspecific, but most commonly related to anemia, smoking, or obesity. No scalp soft tissue abnormality. Sinuses/Orbits: Left gaze noted. Paranasal sinuses are largely clear. No mastoid effusion. Other: None. MRA HEAD FINDINGS Anterior circulation: Both internal carotid arteries are widely patent through the siphons without stenosis. A1 segments patent bilaterally. Normal anterior communicating artery complex. Anterior cerebral  arteries patent without stenosis. No M1 stenosis or occlusion. Distal MCA branches perfused and symmetric. Posterior circulation: Visualized distal V4 segments are patent without stenosis. Left vertebral artery dominant. Neither PICA origin visualized on this portion of the exam. Basilar patent without stenosis. Superior cerebral arteries patent bilaterally. Both PCAs primarily supplied via the basilar. PCAs are patent to their distal aspects without significant stenosis. Anatomic variants: As above.  No aneurysm. MRA NECK FINDINGS Aortic arch: Examination technically limited by motion artifact. Additionally, no time-of-flight sequence is available for review at time of this dictation. Visualized aortic arch within normal limits for caliber with standard branch  pattern. No visible stenosis about the origin the great vessels. Right carotid system: Right common and internal carotid arteries are patent within the neck. No visible stenosis or dissection. Left carotid system: Left common and internal carotid arteries are patent within the neck. No visible stenosis or dissection. Vertebral arteries: Both vertebral arteries appear to arise from the subclavian arteries. No visible proximal subclavian artery stenosis. Neither vertebral artery origin well visualized on this motion degraded exam. Left vertebral artery dominant. Vertebral arteries patent within the neck without visible stenosis or dissection. Other: None IMPRESSION: MRI HEAD: 1. 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 2. Otherwise normal brain MRI for age. MRA HEAD: Normal intracranial MRA. No large vessel occlusion, hemodynamically significant stenosis, or other acute vascular abnormality. MRA NECK: 1. Motion degraded exam. 2. Grossly negative MRA of the neck. No hemodynamically significant stenosis or other acute vascular abnormality. Electronically Signed   By: Rise Mu M.D.   On: 05/02/2023 00:33    CT HEAD WO CONTRAST Result Date: 05/01/2023 CLINICAL DATA:  Headache and pressure behind the left eye with nausea, left-sided facial numbness and right arm numbness. EXAM: CT HEAD WITHOUT CONTRAST TECHNIQUE: Contiguous axial images were obtained from the base of the skull through the vertex without intravenous contrast. RADIATION DOSE REDUCTION: This exam was performed according to the departmental dose-optimization program which includes automated exposure control, adjustment of the mA and/or kV according to patient size and/or use of iterative reconstruction technique. COMPARISON:  None Available. FINDINGS: Brain: No evidence of acute infarction, hemorrhage, hydrocephalus, extra-axial collection or mass lesion/mass effect. Vascular: No hyperdense vessel or unexpected  calcification. Skull: Normal. Negative for fracture or focal lesion. Sinuses/Orbits: No acute finding. Other: None. IMPRESSION: No acute intracranial pathology. Electronically Signed   By: Aram Candela M.D.   On: 05/01/2023 18:51           Blood pressure (!) 142/97, pulse (!) 54, temperature 97.8 F (36.6 C), temperature source Oral, resp. rate 15, height 5\' 6"  (1.676 m), weight 81.1 kg, SpO2 99%.   Medical Problem List and Plan: 1. 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  Antithrombotics: -DVT/anticoagulation:  Pharmaceutical: Lovenox             -antiplatelet therapy: Aspirin and Plavix for three weeks followed by aspirin alone   3. Pain Management: Tylenol, Robaxin as needed             -continue gabapentin 100 mg q HS    4. Mood/Behavior/Sleep: LCSW to evaluate and provide emotional support             -  antipsychotic agents: n/a   5. Neuropsych/cognition: This patient is capable of making decisions on his own behalf.   6. Skin/Wound Care: Routine skin care checks   7. 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  Constipation.  No bowel movement since admission.   Dose of mag citrate on admission Add twice daily Senokot-S.  Milinda Antis, PA-C 05/03/2023  I have examined the patient independently and edited the note for HPI, ROS, exam, assessment, and plan as appropriate. I am in agreement with the above recommendations.   [name], DO 05/03/2023

## 2023-05-03 NOTE — Discharge Summary (Signed)
 Charles Benitez UEA:[id number] DOB: Feb 28, 1976 DOA: 05/01/2023  PCP: Smitty Cords, DO  Admit date: 05/01/2023 Discharge date: 05/03/2023  Time spent: 35 minutes  Recommendations for Outpatient Follow-up:  Outpatient neurology f/u   F/u results of ziopatch (placed at time of discharge)    Discharge Diagnoses:  Principal Problem:   Acute CVA (cerebrovascular accident) [facility]) Active Problems:   Family history of premature coronary artery disease   Chronic bilateral low back pain with bilateral sciatica   Discharge Condition: stable  Diet recommendation: heart healthy  Filed Weights   05/01/23 1716 05/02/23 0221  Weight: 80.3 kg 81.1 kg    History of present illness:  From admission h and p: Charles Benitez is a 48 y.o. male with medical history significant for Chronic low back pain with sciatica and family history of premature CAD and stroke with no other significant past medical history who presented to the ED with left-sided headache with pain behind left eye, left facial numbness, paresthesia of both hands and unsteady gait, with onset of symptoms at 2:30 PM on 05/01/2023.  He was previously in his usual state of health.  He arrived to the ED after 5 PM just outside tPA window.  Symptoms have improved since arrival.   Hospital Course:  Patient presents with headache and unsteady gait. 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 Seen by neurology, advising dapt for 21 days (received first day of plavix on 2/28) with aspirin monotherapy therafter. Ldl is less than 70 so no need for statin. Hypercoag panel ordered, results pending, will need outpatient neurology f/u with [facility] neurology (referral placed). Can use meclizine as needed for vertigo. Discharged to inpatient rehab in [place]. Discharged with ziopatch (no events on tele here), has cardiology f/u scheduled in April to review results.   Procedures: none   Consultations: neurology  Discharge  Exam: Vitals:   05/03/23 0406 05/03/23 0836  BP: 136/89 (!) 142/97  Pulse: (!) 56 (!) 54  Resp: 16 15  Temp: 98.5 F (36.9 C) 97.8 F (36.6 C)  SpO2: 99% 99%    General: NAD Cardiovascular: RRR Respiratory: CTAB  Discharge Instructions   Discharge Instructions     Ambulatory referral to Neurology   Complete by: As directed       Allergies as of 05/03/2023       Reactions   Shrimp [shellfish Allergy] Anaphylaxis        Medication List     STOP taking these medications    cyclobenzaprine 10 MG tablet Commonly known as: FLEXERIL   gabapentin 100 MG capsule Commonly known as: NEURONTIN   lidocaine-hydrocortisone 3-1 % Kit Commonly known as: ANAMANTLE   predniSONE 10 MG tablet Commonly known as: DELTASONE       TAKE these medications    aspirin EC 81 MG tablet Take 1 tablet (81 mg total) by mouth daily. Swallow whole. Start taking on: May 04, 2023   clopidogrel 75 MG tablet Commonly known as: PLAVIX Take 1 tablet (75 mg total) by mouth daily.   meclizine 25 MG tablet Commonly known as: ANTIVERT Take 1 tablet (25 mg total) by mouth 2 (two) times daily as needed for dizziness (prior to PT).       Allergies  Allergen Reactions   Shrimp [Shellfish Allergy] Anaphylaxis    Follow-up Information     Custovic, Rozell Searing, DO Follow up.   Specialty: Cardiology Why: Follow up scheduled for 06/12/23 at 3:45 PM to discuss results of monitor Contact information: [address] [facility]  [address] [place] [address] [phone]                  The results of significant diagnostics from this hospitalization (including imaging, microbiology, ancillary and laboratory) are listed below for reference.    Significant Diagnostic Studies: ECHOCARDIOGRAM COMPLETE BUBBLE STUDY Result Date: 05/02/2023    ECHOCARDIOGRAM REPORT   Patient Name:   Charles Benitez Date of Exam: 05/02/2023 Medical Rec #:  [id number]        Height:       66.0 in Accession #:     [id number]       Weight:       178.8 lb Date of Birth:  Jan 11, 1976        BSA:          1.907 m Patient Age:    48 years         BP:           130/74 mmHg Patient Gender: M                HR:           55 bpm. Exam Location:  [facility] Procedure: 2D Echo, Cardiac Doppler, Color Doppler, Strain Analysis and Saline            Contrast Bubble Study (Both Spectral and Color Flow Doppler were            utilized during procedure). Indications:     Stroke  History:         Patient has no prior history of Echocardiogram examinations.                  Stroke.  Sonographer:     Mikki Harbor Referring Phys:  [id number] Phinneas Shakoor BEDFORD VZDG Diagnosing Phys: Julien Nordmann MD  Sonographer Comments: Image acquisition challenging due to respiratory motion. Global longitudinal strain was attempted. IMPRESSIONS  1. Left ventricular ejection fraction, by estimation, is 60 to 65%. The left ventricle has normal function. The left ventricle has no regional wall motion abnormalities. Left ventricular diastolic parameters were normal. The average left ventricular global longitudinal strain is -20.6 %. The global longitudinal strain is normal.  2. Right ventricular systolic function is normal. The right ventricular size is normal. There is normal pulmonary artery systolic pressure.  3. The mitral valve is normal in structure. No evidence of mitral valve regurgitation. No evidence of mitral stenosis.  4. The aortic valve is normal in structure. Aortic valve regurgitation is not visualized. No aortic stenosis is present.  5. The inferior vena cava is normal in size with greater than 50% respiratory variability, suggesting right atrial pressure of 3 mmHg.  6. Agitated saline contrast bubble study was negative, with no evidence of any interatrial shunt. FINDINGS  Left Ventricle: Left ventricular ejection fraction, by estimation, is 60 to 65%. The left ventricle has normal function. The left ventricle has no regional wall motion abnormalities. The  average left ventricular global longitudinal strain is -20.6 %. Strain was performed and the global longitudinal strain is normal. The left ventricular internal cavity size was normal in size. There is no left ventricular hypertrophy. Left ventricular diastolic parameters were normal. Right Ventricle: The right ventricular size is normal. No increase in right ventricular wall thickness. Right ventricular systolic function is normal. There is normal pulmonary artery systolic pressure. The tricuspid regurgitant velocity is 2.38 m/s, and  with an assumed right atrial pressure of 3 mmHg, the estimated right ventricular systolic pressure is  25.7 mmHg. Left Atrium: Left atrial size was normal in size. Right Atrium: Right atrial size was normal in size. Pericardium: There is no evidence of pericardial effusion. Mitral Valve: The mitral valve is normal in structure. No evidence of mitral valve regurgitation. No evidence of mitral valve stenosis. MV peak gradient, 3.9 mmHg. The mean mitral valve gradient is 1.0 mmHg. Tricuspid Valve: The tricuspid valve is normal in structure. Tricuspid valve regurgitation is mild . No evidence of tricuspid stenosis. Aortic Valve: The aortic valve is normal in structure. Aortic valve regurgitation is not visualized. No aortic stenosis is present. Aortic valve mean gradient measures 4.0 mmHg. Aortic valve peak gradient measures 8.4 mmHg. Aortic valve area, by VTI measures 2.79 cm. Pulmonic Valve: The pulmonic valve was normal in structure. Pulmonic valve regurgitation is not visualized. No evidence of pulmonic stenosis. Aorta: The aortic root is normal in size and structure. Venous: The inferior vena cava is normal in size with greater than 50% respiratory variability, suggesting right atrial pressure of 3 mmHg. IAS/Shunts: No atrial level shunt detected by color flow Doppler. Agitated saline contrast was given intravenously to evaluate for intracardiac shunting. Agitated saline contrast  bubble study was negative, with no evidence of any interatrial shunt. There  is no evidence of a patent foramen ovale. There is no evidence of an atrial septal defect. Additional Comments: 3D imaging was not performed.  LEFT VENTRICLE PLAX 2D LVIDd:         4.70 cm     Diastology LVIDs:         2.80 cm     LV e' medial:    7.62 cm/s LV PW:         1.10 cm     LV E/e' medial:  11.0 LV IVS:        1.10 cm     LV e' lateral:   10.90 cm/s LVOT diam:     2.00 cm     LV E/e' lateral: 7.7 LV SV:         88 LV SV Index:   46          2D Longitudinal Strain LVOT Area:     3.14 cm    2D Strain GLS Avg:     -20.6 %  LV Volumes (MOD) LV vol d, MOD A2C: 52.9 ml LV vol d, MOD A4C: 58.0 ml LV vol s, MOD A2C: 12.3 ml LV vol s, MOD A4C: 22.1 ml LV SV MOD A2C:     40.6 ml LV SV MOD A4C:     58.0 ml LV SV MOD BP:      39.0 ml RIGHT VENTRICLE RV Basal diam:  3.40 cm RV Mid diam:    3.10 cm RV S prime:     14.30 cm/s TAPSE (M-mode): 2.1 cm LEFT ATRIUM             Index        RIGHT ATRIUM           Index LA diam:        3.90 cm 2.05 cm/m   RA Area:     14.10 cm LA Vol (A2C):   50.5 ml 26.48 ml/m  RA Volume:   29.80 ml  15.63 ml/m LA Vol (A4C):   40.7 ml 21.34 ml/m LA Biplane Vol: 45.6 ml 23.91 ml/m  AORTIC VALVE                    PULMONIC VALVE  AV Area (Vmax):    2.99 cm     PV Vmax:       1.12 m/s AV Area (Vmean):   2.73 cm     PV Peak grad:  5.0 mmHg AV Area (VTI):     2.79 cm AV Vmax:           145.00 cm/s AV Vmean:          95.600 cm/s AV VTI:            0.315 m AV Peak Grad:      8.4 mmHg AV Mean Grad:      4.0 mmHg LVOT Vmax:         138.00 cm/s LVOT Vmean:        83.100 cm/s LVOT VTI:          0.280 m LVOT/AV VTI ratio: 0.89  AORTA Ao Root diam: 3.30 cm MITRAL VALVE               TRICUSPID VALVE MV Area (PHT): 4.04 cm    TR Peak grad:   22.7 mmHg MV Area VTI:   3.06 cm    TR Vmax:        238.00 cm/s MV Peak grad:  3.9 mmHg MV Mean grad:  1.0 mmHg    SHUNTS MV Vmax:       0.99 m/s    Systemic VTI:  0.28 m MV Vmean:       53.5 cm/s   Systemic Diam: 2.00 cm MV Decel Time: 188 msec MV E velocity: 84.20 cm/s MV A velocity: 66.90 cm/s MV E/A ratio:  1.26 Julien Nordmann MD Electronically signed by Julien Nordmann MD Signature Date/Time: 05/02/2023/4:27:23 PM    Final    MR Cervical Spine Wo Contrast Result Date: 05/02/2023 CLINICAL DATA:  Initial evaluation for acute ataxia. EXAM: MRI CERVICAL SPINE WITHOUT CONTRAST TECHNIQUE: Multiplanar, multisequence MR imaging of the cervical spine was performed. No intravenous contrast was administered. COMPARISON:  Prior radiograph from 10/26/2015. FINDINGS: Alignment: Straightening of the normal cervical lordosis. No listhesis. Vertebrae: Vertebral body height maintained without acute or chronic fracture. Decreased T1 signal intensity noted throughout the visualized bone marrow, nonspecific, but most commonly related to anemia, smoking or obesity. No discrete or worrisome osseous lesions. No abnormal marrow edema. Cord: Normal signal and morphology. Posterior Fossa, vertebral arteries, paraspinal tissues: Unremarkable. Disc levels: C2-C3: Unremarkable. C3-C4: Disc desiccation with mild disc bulge. Mild bilateral uncovertebral spurring. No spinal stenosis. Foramina remain patent. C4-C5: Mild disc bulge with uncovertebral spurring. No spinal stenosis. Mild left C5 foraminal narrowing. Right neural foramen remains patent. C5-C6: Mild disc bulge with uncovertebral spurring. Flattening and partial effacement of the ventral thecal sac with resultant mild spinal stenosis. Moderate left with mild right C6 foraminal narrowing. C6-C7: Left eccentric disc bulge with left-sided uncovertebral spurring. No significant spinal stenosis. Mild left C7 foraminal narrowing. Right neural foramina remains patent. C7-T1: Negative interspace. Mild right-sided facet hypertrophy. No canal or foraminal stenosis. IMPRESSION: 1. Normal MRI appearance of the cervical spinal cord. 2. Mild multilevel cervical spondylosis with  resultant mild spinal stenosis at C5-6. 3. Mild left C5, moderate left C6, and mild left C7 foraminal narrowing related to disc bulge and uncovertebral disease. Electronically Signed   By: Rise Mu M.D.   On: 05/02/2023 00:38   MR BRAIN WO CONTRAST Result Date: 05/02/2023 CLINICAL DATA:  Initial evaluation for acute headache, neuro deficit. EXAM: MRI HEAD WITHOUT CONTRAST MRA HEAD WITHOUT CONTRAST MRA NECK WITHOUT  AND WITH CONTRAST TECHNIQUE: Multiplanar, multi-echo pulse sequences of the brain and surrounding structures were acquired without intravenous contrast. Angiographic images of the Circle of Willis were acquired using MRA technique without intravenous contrast. Angiographic images of the neck were acquired using MRA technique without and with intravenous contrast. Carotid stenosis measurements (when applicable) are obtained utilizing NASCET criteria, using the distal internal carotid diameter as the denominator. CONTRAST:  8mL GADAVIST GADOBUTROL 1 MMOL/ML IV SOLN COMPARISON:  None Available. FINDINGS: MRI HEAD FINDINGS Brain: Cerebral volume within normal limits. No significant cerebral white matter disease for age. No other focal parenchymal signal abnormality. 6 mm focus of restricted diffusion seen involving the lower left lateral medulla (series 5, image 3), consistent with an acute ischemic infarct. No associated hemorrhage or mass effect. No other evidence for acute or subacute ischemia. No areas of chronic cortical infarction. No acute or chronic intracranial blood products. No mass lesion, midline shift or mass effect. No hydrocephalus or extra-axial fluid collection. Pituitary gland within normal limits. Vascular: Major intracranial vascular flow voids are maintained. Skull and upper cervical spine: Craniocervical junction within normal limits. Decreased T1 signal intensity noted within the bone marrow the visualized upper cervical spine, nonspecific, but most commonly related to  anemia, smoking, or obesity. No scalp soft tissue abnormality. Sinuses/Orbits: Left gaze noted. Paranasal sinuses are largely clear. No mastoid effusion. Other: None. MRA HEAD FINDINGS Anterior circulation: Both internal carotid arteries are widely patent through the siphons without stenosis. A1 segments patent bilaterally. Normal anterior communicating artery complex. Anterior cerebral arteries patent without stenosis. No M1 stenosis or occlusion. Distal MCA branches perfused and symmetric. Posterior circulation: Visualized distal V4 segments are patent without stenosis. Left vertebral artery dominant. Neither PICA origin visualized on this portion of the exam. Basilar patent without stenosis. Superior cerebral arteries patent bilaterally. Both PCAs primarily supplied via the basilar. PCAs are patent to their distal aspects without significant stenosis. Anatomic variants: As above.  No aneurysm. MRA NECK FINDINGS Aortic arch: Examination technically limited by motion artifact. Additionally, no time-of-flight sequence is available for review at time of this dictation. Visualized aortic arch within normal limits for caliber with standard branch pattern. No visible stenosis about the origin the great vessels. Right carotid system: Right common and internal carotid arteries are patent within the neck. No visible stenosis or dissection. Left carotid system: Left common and internal carotid arteries are patent within the neck. No visible stenosis or dissection. Vertebral arteries: Both vertebral arteries appear to arise from the subclavian arteries. No visible proximal subclavian artery stenosis. Neither vertebral artery origin well visualized on this motion degraded exam. Left vertebral artery dominant. Vertebral arteries patent within the neck without visible stenosis or dissection. Other: None IMPRESSION: MRI HEAD: 1. 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 2. Otherwise normal brain MRI for  age. MRA HEAD: Normal intracranial MRA. No large vessel occlusion, hemodynamically significant stenosis, or other acute vascular abnormality. MRA NECK: 1. Motion degraded exam. 2. Grossly negative MRA of the neck. No hemodynamically significant stenosis or other acute vascular abnormality. Electronically Signed   By: Rise Mu M.D.   On: 05/02/2023 00:33   MR Angiogram Neck W or Wo Contrast Result Date: 05/02/2023 CLINICAL DATA:  Initial evaluation for acute headache, neuro deficit. EXAM: MRI HEAD WITHOUT CONTRAST MRA HEAD WITHOUT CONTRAST MRA NECK WITHOUT AND WITH CONTRAST TECHNIQUE: Multiplanar, multi-echo pulse sequences of the brain and surrounding structures were acquired without intravenous contrast. Angiographic images of the Circle of Willis were  acquired using MRA technique without intravenous contrast. Angiographic images of the neck were acquired using MRA technique without and with intravenous contrast. Carotid stenosis measurements (when applicable) are obtained utilizing NASCET criteria, using the distal internal carotid diameter as the denominator. CONTRAST:  8mL GADAVIST GADOBUTROL 1 MMOL/ML IV SOLN COMPARISON:  None Available. FINDINGS: MRI HEAD FINDINGS Brain: Cerebral volume within normal limits. No significant cerebral white matter disease for age. No other focal parenchymal signal abnormality. 6 mm focus of restricted diffusion seen involving the lower left lateral medulla (series 5, image 3), consistent with an acute ischemic infarct. No associated hemorrhage or mass effect. No other evidence for acute or subacute ischemia. No areas of chronic cortical infarction. No acute or chronic intracranial blood products. No mass lesion, midline shift or mass effect. No hydrocephalus or extra-axial fluid collection. Pituitary gland within normal limits. Vascular: Major intracranial vascular flow voids are maintained. Skull and upper cervical spine: Craniocervical junction within normal  limits. Decreased T1 signal intensity noted within the bone marrow the visualized upper cervical spine, nonspecific, but most commonly related to anemia, smoking, or obesity. No scalp soft tissue abnormality. Sinuses/Orbits: Left gaze noted. Paranasal sinuses are largely clear. No mastoid effusion. Other: None. MRA HEAD FINDINGS Anterior circulation: Both internal carotid arteries are widely patent through the siphons without stenosis. A1 segments patent bilaterally. Normal anterior communicating artery complex. Anterior cerebral arteries patent without stenosis. No M1 stenosis or occlusion. Distal MCA branches perfused and symmetric. Posterior circulation: Visualized distal V4 segments are patent without stenosis. Left vertebral artery dominant. Neither PICA origin visualized on this portion of the exam. Basilar patent without stenosis. Superior cerebral arteries patent bilaterally. Both PCAs primarily supplied via the basilar. PCAs are patent to their distal aspects without significant stenosis. Anatomic variants: As above.  No aneurysm. MRA NECK FINDINGS Aortic arch: Examination technically limited by motion artifact. Additionally, no time-of-flight sequence is available for review at time of this dictation. Visualized aortic arch within normal limits for caliber with standard branch pattern. No visible stenosis about the origin the great vessels. Right carotid system: Right common and internal carotid arteries are patent within the neck. No visible stenosis or dissection. Left carotid system: Left common and internal carotid arteries are patent within the neck. No visible stenosis or dissection. Vertebral arteries: Both vertebral arteries appear to arise from the subclavian arteries. No visible proximal subclavian artery stenosis. Neither vertebral artery origin well visualized on this motion degraded exam. Left vertebral artery dominant. Vertebral arteries patent within the neck without visible stenosis or  dissection. Other: None IMPRESSION: MRI HEAD: 1. 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 2. Otherwise normal brain MRI for age. MRA HEAD: Normal intracranial MRA. No large vessel occlusion, hemodynamically significant stenosis, or other acute vascular abnormality. MRA NECK: 1. Motion degraded exam. 2. Grossly negative MRA of the neck. No hemodynamically significant stenosis or other acute vascular abnormality. Electronically Signed   By: Rise Mu M.D.   On: 05/02/2023 00:33   MR ANGIO HEAD WO CONTRAST Result Date: 05/02/2023 CLINICAL DATA:  Initial evaluation for acute headache, neuro deficit. EXAM: MRI HEAD WITHOUT CONTRAST MRA HEAD WITHOUT CONTRAST MRA NECK WITHOUT AND WITH CONTRAST TECHNIQUE: Multiplanar, multi-echo pulse sequences of the brain and surrounding structures were acquired without intravenous contrast. Angiographic images of the Circle of Willis were acquired using MRA technique without intravenous contrast. Angiographic images of the neck were acquired using MRA technique without and with intravenous contrast. Carotid stenosis measurements (when applicable) are obtained  utilizing NASCET criteria, using the distal internal carotid diameter as the denominator. CONTRAST:  8mL GADAVIST GADOBUTROL 1 MMOL/ML IV SOLN COMPARISON:  None Available. FINDINGS: MRI HEAD FINDINGS Brain: Cerebral volume within normal limits. No significant cerebral white matter disease for age. No other focal parenchymal signal abnormality. 6 mm focus of restricted diffusion seen involving the lower left lateral medulla (series 5, image 3), consistent with an acute ischemic infarct. No associated hemorrhage or mass effect. No other evidence for acute or subacute ischemia. No areas of chronic cortical infarction. No acute or chronic intracranial blood products. No mass lesion, midline shift or mass effect. No hydrocephalus or extra-axial fluid collection. Pituitary gland within normal  limits. Vascular: Major intracranial vascular flow voids are maintained. Skull and upper cervical spine: Craniocervical junction within normal limits. Decreased T1 signal intensity noted within the bone marrow the visualized upper cervical spine, nonspecific, but most commonly related to anemia, smoking, or obesity. No scalp soft tissue abnormality. Sinuses/Orbits: Left gaze noted. Paranasal sinuses are largely clear. No mastoid effusion. Other: None. MRA HEAD FINDINGS Anterior circulation: Both internal carotid arteries are widely patent through the siphons without stenosis. A1 segments patent bilaterally. Normal anterior communicating artery complex. Anterior cerebral arteries patent without stenosis. No M1 stenosis or occlusion. Distal MCA branches perfused and symmetric. Posterior circulation: Visualized distal V4 segments are patent without stenosis. Left vertebral artery dominant. Neither PICA origin visualized on this portion of the exam. Basilar patent without stenosis. Superior cerebral arteries patent bilaterally. Both PCAs primarily supplied via the basilar. PCAs are patent to their distal aspects without significant stenosis. Anatomic variants: As above.  No aneurysm. MRA NECK FINDINGS Aortic arch: Examination technically limited by motion artifact. Additionally, no time-of-flight sequence is available for review at time of this dictation. Visualized aortic arch within normal limits for caliber with standard branch pattern. No visible stenosis about the origin the great vessels. Right carotid system: Right common and internal carotid arteries are patent within the neck. No visible stenosis or dissection. Left carotid system: Left common and internal carotid arteries are patent within the neck. No visible stenosis or dissection. Vertebral arteries: Both vertebral arteries appear to arise from the subclavian arteries. No visible proximal subclavian artery stenosis. Neither vertebral artery origin well  visualized on this motion degraded exam. Left vertebral artery dominant. Vertebral arteries patent within the neck without visible stenosis or dissection. Other: None IMPRESSION: MRI HEAD: 1. 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 2. Otherwise normal brain MRI for age. MRA HEAD: Normal intracranial MRA. No large vessel occlusion, hemodynamically significant stenosis, or other acute vascular abnormality. MRA NECK: 1. Motion degraded exam. 2. Grossly negative MRA of the neck. No hemodynamically significant stenosis or other acute vascular abnormality. Electronically Signed   By: Rise Mu M.D.   On: 05/02/2023 00:33   CT HEAD WO CONTRAST Result Date: 05/01/2023 CLINICAL DATA:  Headache and pressure behind the left eye with nausea, left-sided facial numbness and right arm numbness. EXAM: CT HEAD WITHOUT CONTRAST TECHNIQUE: Contiguous axial images were obtained from the base of the skull through the vertex without intravenous contrast. RADIATION DOSE REDUCTION: This exam was performed according to the departmental dose-optimization program which includes automated exposure control, adjustment of the mA and/or kV according to patient size and/or use of iterative reconstruction technique. COMPARISON:  None Available. FINDINGS: Brain: No evidence of acute infarction, hemorrhage, hydrocephalus, extra-axial collection or mass lesion/mass effect. Vascular: No hyperdense vessel or unexpected calcification. Skull: Normal. Negative for fracture or  focal lesion. Sinuses/Orbits: No acute finding. Other: None. IMPRESSION: No acute intracranial pathology. Electronically Signed   By: Aram Candela M.D.   On: 05/01/2023 18:51    Microbiology: No results found for this or any previous visit (from the past 240 hours).   Labs: Basic Metabolic Panel: Recent Labs  Lab 05/01/23 1715  NA 138  K 4.3  CL 107  CO2 25  GLUCOSE 133*  BUN 14  CREATININE 0.71  CALCIUM 8.6*   Liver  Function Tests: Recent Labs  Lab 05/01/23 1715  AST 16  ALT 23  ALKPHOS 72  BILITOT 0.3  PROT 6.3*  ALBUMIN 3.3*   No results for input(s): "LIPASE", "AMYLASE" in the last 168 hours. No results for input(s): "AMMONIA" in the last 168 hours. CBC: Recent Labs  Lab 05/01/23 1715 05/02/23 0610  WBC 13.4*  --   NEUTROABS 10.4*  --   HGB 16.7 15.4  HCT 49.9 45.1  MCV 91.4  --   PLT 341 294   Cardiac Enzymes: No results for input(s): "CKTOTAL", "CKMB", "CKMBINDEX", "TROPONINI" in the last 168 hours. BNP: BNP (last 3 results) No results for input(s): "BNP" in the last 8760 hours.  ProBNP (last 3 results) No results for input(s): "PROBNP" in the last 8760 hours.  CBG: Recent Labs  Lab 05/01/23 1715  GLUCAP 84       Signed:  Silvano Bilis MD.  [facility] 05/03/2023, 11:18 AM

## 2023-05-03 NOTE — Progress Notes (Addendum)
 Patient complained of chest pain around heart monitor, intermittently, per patient it felt sharp and achy. New orders placed for EKG, hand delivered to [place] love, PA. PA to consult on call cardiologist for more advisement. Patient stable; at this time patient denies any pain in this area. Notified oncoming nurse.    Tilden Dome, LPN

## 2023-05-03 NOTE — Progress Notes (Signed)
 Charles Sheriff, DO  Physician Physical Medicine and Rehabilitation   PMR Pre-admission     Signed   Date of Service: 05/03/2023 10:56 AM  Related encounter: ED to Hosp-Admission (Discharged) from 05/01/2023 in [facility] 1C MEDICAL TELEMETRY   Signed     Expand All Collapse All  PMR Admission Coordinator Pre-Admission Assessment   Patient: Charles Benitez is an 48 y.o., male MRN: [id number] DOB: 10/06/1975 Height: 5\' 6"  (167.6 cm) Weight: 81.1 kg   Insurance Information HMO:     PPO:      PCP:      IPA:      80/20:      OTHER:  PRIMARY: uninsured      Policy#:       Subscriber:  CM Name:       Phone#:      Fax#:  Pre-Cert#:       Employer:  Benefits:  Phone #:      Name:  Eff. Date:      Deduct:       Out of Pocket Max:       Life Max:  CIR:       SNF:  Outpatient:      Co-Pay:  Home Health:       Co-Pay:  DME:      Co-Pay:  Providers:  SECONDARY:       Policy#:      Phone#:    Artist:       Phone#:    The Data processing manager" for patients in Inpatient Rehabilitation Facilities with attached "Privacy Act Statement-Health Care Records" was provided and verbally reviewed with: Patient   Emergency Contact Information Contact Information       Name Relation Home Work Mobile    [place] Mother [phone]             Other Contacts   None on File        Current Medical History  Patient Admitting Diagnosis: CVA    History of Present Illness: Pt is a 48 y/o with PMH of sciatica who presented to [facility] ED on 05/01/23 with c/o left sided headache, left facial numbness, and BUE paresthesias.  BP soft on arrival 103/82, vitals otherwise normal, labs notable for WBC 13.4, glucose 133, calcium 8.6.  CT head negative and neurology consulted with concern for posterior circulation CVA not seen on CT.   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  Hospital course BP management.  Neurology recommended  asa 81 and plavix 75 QD x 3 weeks, then asa 81 monotherapy, hypercoag panel to be followed up on after d/c.  Therapy evaluations were completed and pt was recommended for CIR.    Complete NIHSS TOTAL: 1   Patient's medical record from [facility] has been reviewed by the rehabilitation admission coordinator and physician.   Past Medical History      Past Medical History:  Diagnosis Date   Asthma     Chronic back pain            Has the patient had major surgery during 100 days prior to admission? No   Family History   family history is not on file.   Current Medications  Current Medications    Current Facility-Administered Medications:    acetaminophen (TYLENOL) tablet 650 mg, 650 mg, Oral, Q4H PRN **OR** acetaminophen (TYLENOL) 160 MG/5ML solution 650 mg, 650 mg, Per Tube, Q4H PRN **OR** acetaminophen (TYLENOL) suppository 650  mg, 650 mg, Rectal, Q4H PRN, Andris Baumann, MD   aspirin EC tablet 81 mg, 81 mg, Oral, Daily, Lindajo Royal V, MD, 81 mg at 05/03/23 4098   cyclobenzaprine (FLEXERIL) tablet 10 mg, 10 mg, Oral, TID PRN, Andris Baumann, MD   enoxaparin (LOVENOX) injection 40 mg, 40 mg, Subcutaneous, Q24H, Lindajo Royal V, MD, 40 mg at 05/03/23 1191   gabapentin (NEURONTIN) capsule 100 mg, 100 mg, Oral, QHS, Andris Baumann, MD, 100 mg at 05/02/23 2211     Patients Current Diet:  Diet Order                  Diet regular Room service appropriate? Yes; Fluid consistency: Thin  Diet effective now                         Precautions / Restrictions Precautions Precautions: Fall Restrictions Weight Bearing Restrictions Per Provider Order: No    Has the patient had 2 or more falls or a fall with injury in the past year? No   Prior Activity Level Community (5-7x/wk): independent with no DME, driving, not currently working   Prior Functional Level Self Care: Did the patient need help bathing, dressing, using the toilet or eating? Independent   Indoor Mobility: Did  the patient need assistance with walking from room to room (with or without device)? Independent   Stairs: Did the patient need assistance with internal or external stairs (with or without device)? Independent   Functional Cognition: Did the patient need help planning regular tasks such as shopping or remembering to take medications? Independent   Patient Information Are you of Hispanic, Latino/a,or Spanish origin?: C. Yes, [place] What is your race?: Z. None of the above ("hispanic") Do you need or want an interpreter to communicate with a doctor or health care staff?: 0. No (declines need for interpreter)   Patient's Response To:  Health Literacy and Transportation Is the patient able to respond to health literacy and transportation needs?: Yes Health Literacy - How often do you need to have someone help you when you read instructions, pamphlets, or other written material from your doctor or pharmacy?: Never In the past 12 months, has lack of transportation kept you from medical appointments or from getting medications?: No In the past 12 months, has lack of transportation kept you from meetings, work, or from getting things needed for daily living?: No   Home Assistive Devices / Equipment Home Equipment: None   Prior Device Use: Indicate devices/aids used by the patient prior to current illness, exacerbation or injury? None of the above   Current Functional Level Cognition   Orientation Level: Oriented X4    Extremity Assessment (includes Sensation/Coordination)   Upper Extremity Assessment:  (grossly 4+/5 throughout; generalized paresthesia elbows distally bilat, R > L; finger-to-nose with mild delay, L > R) LUE Deficits / Details: Strength/ROM WFL, some ataxic movements appreciated with intentional movements/initiation. Will continue to monitor. Decreased FMC as compared to R.  Lower Extremity Assessment:  (grossly 4+/5 throughout with isolated MMT; denies sensory deficit,  isolated proprioception intact.  Significant ataxia, coordination and functional propriceptive deficits in standing/gait) LLE Deficits / Details: Noted decreased smotheness of movement with intentional LLE movement, pt reports LLE feels weaker than usualy particularly when weight bearing. Tends to lean to L during functional tasks . Pt states it "feels like I'm being pulled to this side".     ADLs   Overall ADL's :  Needs assistance/impaired General ADL Comments: MOD A for bed/functional mobility 2/2 significant L lateral lean and instability with upright positionign including sitting and standing balance. Able to stand at sink with MOD A and perform HH with min cueing for sequencing of task  (i.e to use soap). Noted to overshoot when using LUE for functional activity with decreased accuracy when throwing away paper towel and reaching for soap dispenser. Requires MOD A for static sitting balance at EOB to don bilat hospital socks. No physical assist to thread socks over feet, but requires increased time to perform.     Mobility   Overal bed mobility: Needs Assistance Bed Mobility: Supine to Sit, Sit to Supine Supine to sit: Contact guard Sit to supine: Contact guard assist General bed mobility comments: MOD A for trunk elevation and to maintain initial upright posture when coming to sit at EOB.     Transfers   Overall transfer level: Needs assistance Equipment used: None Transfers: Sit to/from Stand Sit to Stand: Mod assist General transfer comment: L ant/lateral lean in standing, min/mod assist for correction     Ambulation / Gait / Stairs / Wheelchair Mobility   Ambulation/Gait Ambulation/Gait assistance: Mod assist Gait Distance (Feet):  (20' x2) Assistive device: None General Gait Details: reciprocal stepping pattern with marked impairments in functional control, placement and awareness of L LE with gait efforts.  Intermittent scissoring with clunky, labored contact to floor.  L lateral  lean/weight shift worsens with divided attention and dynamic activity, consistent mod assist to maintain balance/prevent fall.  Does tend to reach to furniture with bilat UEs throughout gait trial, difficulty releasing grasp of objects with L UE during gait trial     Posture / Balance Dynamic Sitting Balance Sitting balance - Comments: L lateral lean, but does correct indep Balance Overall balance assessment: Needs assistance Sitting-balance support: No upper extremity supported, Feet supported Sitting balance-Leahy Scale: Fair Sitting balance - Comments: L lateral lean, but does correct indep Postural control: Left lateral lean Standing balance support: Reliant on assistive device for balance, During functional activity, Single extremity supported, No upper extremity supported Standing balance-Leahy Scale: Poor     Special needs/care consideration Diabetic management yes    Previous Home Environment (from acute therapy documentation) Living Arrangements: Children, Parent Available Help at Discharge: Family, Available PRN/intermittently Type of Home: House Home Layout: Two level, Bed/bath upstairs Alternate Level Stairs-Rails: Right Alternate Level Stairs-Number of Steps: flight Home Access: Level entry Bathroom Shower/Tub: Engineer, manufacturing systems: Standard Home Care Services: No   Discharge Living Setting Plans for Discharge Living Setting: Patient's home, Lives with (comment) (mom and 67 y/o son) Type of Home at Discharge: House Discharge Home Layout: Two level, Bed/bath upstairs Alternate Level Stairs-Rails: Right Alternate Level Stairs-Number of Steps: flight Discharge Home Access: Level entry Discharge Bathroom Shower/Tub: Tub/shower unit Discharge Bathroom Toilet: Standard Discharge Bathroom Accessibility: No Does the patient have any problems obtaining your medications?: No   Social/Family/Support Systems Patient Roles: Parent Contact Information: 78 y/o son  (lives with him full time), and 71 y/o daughter (part time) Anticipated Caregiver: mom, Courtney Paris Anticipated Caregiver's Contact Information: [phone] Ability/Limitations of Caregiver: supervision only Caregiver Availability: 24/7 Discharge Plan Discussed with Primary Caregiver: Yes Is Caregiver In Agreement with Plan?: Yes Does Caregiver/Family have Issues with Lodging/Transportation while Pt is in Rehab?: No   Goals Patient/Family Goal for Rehab: PT/OT supervision to mod I, SLP mod I Expected length of stay: 12-14 days Additional Information: Discharge plan: return to pt's home  with mother to provide 24/7 supervision if needed Pt/Family Agrees to Admission and willing to participate: Yes Program Orientation Provided & Reviewed with Pt/Caregiver Including Roles  & Responsibilities: Yes   Decrease burden of Care through IP rehab admission: n/a   Possible need for SNF placement upon discharge:  Not anticipated, plan for discharge to pt's home where his mother can provide 24/7 supervision.    Patient Condition: I have reviewed medical records from [facility], spoken with CM, and patient. I discussed via phone for inpatient rehabilitation assessment.  Patient will benefit from ongoing PT, OT, and SLP, can actively participate in 3 hours of therapy a day 5 days of the week, and can make measurable gains during the admission.  Patient will also benefit from the coordinated team approach during an Inpatient Acute Rehabilitation admission.  The patient will receive intensive therapy as well as Rehabilitation physician, nursing, social worker, and care management interventions.  Due to safety, disease management, medication administration, and patient education the patient requires 24 hour a day rehabilitation nursing.  The patient is currently min to mod assist with mobility and basic ADLs.  Discharge setting and therapy post discharge at home with home health is anticipated.  Patient has agreed to  participate in the Acute Inpatient Rehabilitation Program and will admit today.   Preadmission Screen Completed By:  Stephania Fragmin, PT, DPT 05/03/2023 10:56 AM ______________________________________________________________________   Discussed status with Dr. Shearon Stalls on 05/03/23  at 11:10 AM  and received approval for admission today.   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 Yes Co-Morbidities requiring supervision/potential complications: CVA, Hypertension, bradycardia, CAD, poorly controlled bilateral low back pain with sciatica Due to safety, skin/wound care, disease management, medication administration, pain management, and patient education, does the patient require 24 hr/day rehab nursing? Yes Does the patient require coordinated care of a physician, rehab nurse, PT, OT, and SLP to address physical and functional deficits in the context of the above medical diagnosis(es)? Yes Addressing deficits in the following areas: balance, endurance, locomotion, strength, transferring, bowel/bladder control, bathing, dressing, feeding, grooming, toileting, cognition, speech, and language Can the patient actively participate in an intensive therapy program of at least 3 hrs of therapy 5 days a week? Yes The potential for patient to make measurable gains while on inpatient rehab is good Anticipated functional outcomes upon discharge from inpatient rehab: modified independent PT, modified independent OT, modified independent SLP Estimated rehab length of stay to reach the above functional goals is: 12-14 days Anticipated discharge destination: Home 10. Overall Rehab/Functional Prognosis: good      MD Signature:   Charles Sheriff, DO 05/03/2023           Revision History

## 2023-05-03 NOTE — Plan of Care (Signed)
  Problem: Education: Goal: Knowledge of disease or condition will improve Outcome: Progressing Goal: Knowledge of patient specific risk factors will improve (DELETE if not current risk factor) Outcome: Progressing   Problem: Coping: Goal: Will verbalize positive feelings about self Outcome: Progressing   Problem: Self-Care: Goal: Ability to participate in self-care as condition permits will improve Outcome: Progressing   Problem: Clinical Measurements: Goal: Ability to maintain clinical measurements within normal limits will improve Outcome: Progressing

## 2023-05-03 NOTE — Progress Notes (Signed)
 Admitted to CIR today. Discussed unit protocol/alarms and policies on this unit. Admission/Assessment complete.    Tilden Dome, LPN

## 2023-05-03 NOTE — Evaluation (Addendum)
 Occupational Therapy Assessment and Plan  Patient Details  Name: Charles Benitez MRN: [id number] Date of Birth: 11/01/1975  OT Diagnosis: ataxia and muscle weakness (generalized) Rehab Potential: Rehab Potential (ACUTE ONLY): Excellent ELOS: 10-12 days   Today's Date: 05/04/2023 OT Individual Time: 7829-5621 OT Individual Time Calculation (min): 71 min     Hospital Problem: Principal Problem:   CVA (cerebral vascular accident) [facility])   Past Medical History:  Past Medical History:  Diagnosis Date   Asthma    Chronic back pain    Past Surgical History: History reviewed. No pertinent surgical history.  Assessment & Plan Clinical Impression: Patient is a 48 year old male in his usual state of excellent health who presented to the emergency department on 05/01/2023 complaining of left-sided headache with pain behind the left eye, left facial numbness and paresthesias of both hands with unsteady gait. He was outside tPA window. Symptoms improved upon arrival. 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 Neurology evaluated and advised DAPT for 21 days followed by aspirin monotherapy. His LDL was less than 70 so statin therapy was not initiated. .  Patient transferred to CIR on 05/03/2023 .    Patient currently requires  CGA-Min A  with basic self-care skills secondary to muscle weakness, decreased attention, decreased awareness, decreased problem solving, decreased safety awareness, and decreased memory, and decreased standing balance, decreased postural control, and decreased balance strategies.  Prior to hospitalization, patient could complete all ADL tasks with independent .  Patient will benefit from skilled intervention to increase independence with basic self-care skills and increase level of independence with iADL prior to discharge home with care partner.  Anticipate patient will require intermittent supervision and follow up outpatient.  OT - End of Session Activity  Tolerance: Decreased this session Endurance Deficit: Yes Endurance Deficit Description: verbalized mental fatigue as session progressed. OT Assessment Rehab Potential (ACUTE ONLY): Excellent OT Patient demonstrates impairments in the following area(s): Balance;Sensory;Cognition;Endurance;Motor;Safety OT Basic ADL's Functional Problem(s): Bathing;Dressing;Toileting OT Advanced ADL's Functional Problem(s): Light Housekeeping OT Transfers Functional Problem(s): Tub/Shower;Toilet OT Additional Impairment(s): Fuctional Use of Upper Extremity OT Plan OT Intensity: Minimum of 1-2 x/day, 45 to 90 minutes OT Frequency: 5 out of 7 days OT Duration/Estimated Length of Stay: 10-12 days OT Treatment/Interventions: UE/LE Strength taining/ROM;Wheelchair propulsion/positioning;Therapeutic Exercise;Self Care/advanced ADL retraining;Neuromuscular re-education;Balance/vestibular training;Disease mangement/prevention;Cognitive remediation/compensation;DME/adaptive equipment instruction;Pain management;UE/LE Coordination activities;Patient/family education;Community reintegration;Discharge planning;Functional mobility training;Psychosocial support;Therapeutic Activities;Visual/perceptual remediation/compensation OT Basic Self-Care Anticipated Outcome(s): Mod I OT Toileting Anticipated Outcome(s): Mod I OT Bathroom Transfers Anticipated Outcome(s): Mod I OT Recommendation Patient destination: Home Follow Up Recommendations: Outpatient OT Equipment Recommended: Tub/shower seat;To be determined   OT Evaluation Precautions/Restrictions  Precautions Precautions: Fall Precaution/Restrictions Comments: fall, L ataxia, R side sensory impairments (temp and pain) Restrictions Weight Bearing Restrictions Per Provider Order: No  Pain Pain Assessment Pain Scale: 0-10 Pain Score: 1  Pain Type: Acute pain Pain Location: Head Pain Orientation:  (reports feeling of pressure behind eyes.) Pain Descriptors /  Indicators: Aching;Pressure Home Living/Prior Functioning Home Living Family/patient expects to be discharged to:: Private residence Living Arrangements: Parent, Children Available Help at Discharge: Family, Available 24 hours/day Type of Home: House Home Access: Stairs to enter Secretary/administrator of Steps: 1 Entrance Stairs-Rails: Right Home Layout: Two level, Bed/bath upstairs Alternate Level Stairs-Number of Steps: flight Alternate Level Stairs-Rails: Right Bathroom Shower/Tub: Tub/shower unit, Buyer, retail: Yes  Lives With: Family, Other (Comment) (Grandma, mother, son 47 year old)) IADL History Current License: Yes Occupation: Other (comment) Type of Occupation: working  on obtaining a job and has been job hunting prior to hospitalization. Has a job  lined up that he was going to start. Prior Function Level of Independence: Independent with gait, Independent with transfers, Independent with basic ADLs, Independent with homemaking with ambulation  Able to Take Stairs?: Yes Driving: Yes Vocation:  (was about to start new job) Vision Baseline Vision/History: 0 No visual deficits Ability to See in Adequate Light: 0 Adequate Patient Visual Report: No change from baseline Additional Comments: Left eye red Perception  Perception: Impaired Praxis Praxis: Impaired Praxis Impairment Details: Motor planning;Organization;Ideomotor Cognition Cognition Overall Cognitive Status: Within Functional Limits for tasks assessed Arousal/Alertness: Awake/alert Orientation Level: Person;Place;Situation Person: Oriented Place: Oriented Situation: Oriented Memory: Impaired Memory Impairment: Decreased recall of new information;Decreased short term memory Awareness: Appears intact Problem Solving: Impaired Sequencing: Appears intact Organizing: Appears intact Initiating: Appears intact Self Monitoring: Appears intact Self Correcting:  Appears intact Behaviors: Impulsive;Perseveration Safety/Judgment: Impaired Comments: baseline attention deficits Brief Interview for Mental Status (BIMS) Repetition of Three Words (First Attempt): 3 Temporal Orientation: Year: Correct Temporal Orientation: Month: Accurate within 5 days Temporal Orientation: Day: Correct Recall: "Sock": Yes, no cue required Recall: "Blue": Yes, no cue required Recall: "Bed": Yes, no cue required BIMS Summary Score: 15 Sensation Sensation Light Touch: Impaired Detail Central sensation comments: reports numbness in right arm, right side and right leg. Unable to feel needle pricks. Light Touch Impaired Details: Impaired RUE;Impaired RLE Hot/Cold: Impaired Detail Hot/Cold Impaired Details: Impaired RUE;Impaired RLE Stereognosis: Impaired Detail Stereognosis Impaired Details: Impaired RUE Coordination Gross Motor Movements are Fluid and Coordinated: No Fine Motor Movements are Fluid and Coordinated: No Motor  Motor Motor: Ataxia;Abnormal postural alignment and control;Motor impersistence Motor - Skilled Clinical Observations: R sensory impairments, L sided ataxia  Trunk/Postural Assessment  Cervical Assessment Cervical Assessment: Within Functional Limits Thoracic Assessment Thoracic Assessment: Within Functional Limits Lumbar Assessment Lumbar Assessment: Within Functional Limits Postural Control Postural Control: Deficits on evaluation  Balance Balance Balance Assessed: Yes Static Sitting Balance Static Sitting - Level of Assistance: 5: Stand by assistance Dynamic Sitting Balance Dynamic Sitting - Level of Assistance: 5: Stand by assistance Static Standing Balance Static Standing - Level of Assistance: 4: Min assist Dynamic Standing Balance Dynamic Standing - Level of Assistance: 4: Min assist Dynamic Standing - Balance Activities: Forward lean/weight shifting Extremity/Trunk Assessment RUE Assessment Active Range of Motion (AROM)  Comments: WFL General Strength Comments: grip: 110#,  21.5# 5/5,  shoulder, elbow overall. LUE Assessment Active Range of Motion (AROM) Comments: WFL General Strength Comments: grip: 120# lateral pinch: 21.5#, baseline shoulder defcits normally experiencing pain with shoulder ranges. strength is 5/5 throughout UE though.  Care Tool Care Tool Self Care Eating   Eating Assist Level: Independent    Oral Care    Oral Care Assist Level: Independent    Bathing   Body parts bathed by patient: Right arm;Face;Left arm;Chest;Abdomen;Front perineal area;Buttocks;Right upper leg;Left upper leg;Right lower leg;Left lower leg     Assist Level: Supervision/Verbal cueing    Upper Body Dressing(including orthotics)   What is the patient wearing?: Pull over shirt   Assist Level: Supervision/Verbal cueing    Lower Body Dressing (excluding footwear)   What is the patient wearing?: Underwear/pull up;Pants Assist for lower body dressing: Contact Guard/Touching assist    Putting on/Taking off footwear   What is the patient wearing?: Socks;Shoes Assist for footwear: Contact Guard/Touching assist       Care Tool Toileting Toileting activity   Assist for toileting: Contact Guard/Touching assist  Care Tool Bed Mobility    Sit to lying activity   Sit to lying assist level: Supervision/Verbal cueing    Lying to sitting on side of bed activity   Lying to sitting on side of bed assist level: the ability to move from lying on the back to sitting on the side of the bed with no back support.: Supervision/Verbal cueing     Care Tool Transfers Sit to stand transfer   Sit to stand assist level: Contact Guard/Touching assist    Chair/bed transfer   Chair/bed transfer assist level: Contact Guard/Touching assist     Toilet transfer   Assist Level: Contact Guard/Touching assist     Care Tool Cognition  Expression of Ideas and Wants Expression of Ideas and Wants: 4. Without difficulty (complex  and basic) - expresses complex messages without difficulty and with speech that is clear and easy to understand  Understanding Verbal and Non-Verbal Content Understanding Verbal and Non-Verbal Content: 4. Understands (complex and basic) - clear comprehension without cues or repetitions   Memory/Recall Ability Memory/Recall Ability : Current season;Location of own room;That he or she is in a hospital/hospital unit   Refer to Care Plan for Long Term Goals  SHORT TERM GOAL WEEK 1 OT Short Term Goal 1 (Week 1): Pt will demonstrate improved dynamic standing balance while completing self care task requiring SBA for safety. OT Short Term Goal 2 (Week 1): Pt will demonstrate independence with self monitoring fatigue level in order to utilize rest breaks as needed when completing daily tasks.  Recommendations for other services: None    Skilled Therapeutic Intervention ADL ADL Eating: Independent Where Assessed-Eating: Bed level Grooming: Independent Where Assessed-Grooming: Edge of bed Upper Body Bathing: Supervision/safety Where Assessed-Upper Body Bathing: Shower Lower Body Bathing: Supervision/safety Where Assessed-Lower Body Bathing: Shower Upper Body Dressing: Supervision/safety Where Assessed-Upper Body Dressing: Edge of bed Lower Body Dressing: Supervision/safety Where Assessed-Lower Body Dressing: Chair Toileting: Supervision/safety Where Assessed-Toileting: Teacher, adult education: Close supervision Toilet Transfer Method: Insurance claims handler: Not assessed Film/video editor: Close supervision Film/video editor Method: Designer, industrial/product: Press photographer  Bed Mobility Bed Mobility: Sit to Supine Supine to Sit: Supervision/Verbal cueing Sit to Supine: Supervision/Verbal cueing Transfers Sit to Stand: Contact Guard/Touching assist Stand to Sit: Contact Guard/Touching assist Skilled Intervention Pt participated in  OT evaluation this date. See above for functional performance during ADL task. Completed shower requiring primarily VC for safety d/t ataxia and incoordination. Assessed hand strength and coordination. Pt verbalized more difficulty with mental aspect of session causing increased mental fatigue and slight headache. Pt educated on therapy schedule, safety policy and participated in goal making. Pt slightly impulsive with baseline attention deficits which makes him a fall risk.   Discharge Criteria: Patient will be discharged from OT if patient refuses treatment 3 consecutive times without medical reason, if treatment goals not met, if there is a change in medical status, if patient makes no progress towards goals or if patient is discharged from hospital.  The above assessment, treatment plan, treatment alternatives and goals were discussed and mutually agreed upon: by patient  Limmie Patricia, OTR/L,CBIS  Supplemental OT - [facility] and [facility] Secure Chat Preferred   05/04/2023, 4:25 PM

## 2023-05-03 NOTE — Progress Notes (Signed)
 Inpatient Rehab Coordinator Note:  I spoke with patient over the phone to discuss CIR recommendations and goals/expectations of CIR stay.  We reviewed 3 hrs/day of therapy, physician follow up, and average length of stay 2 weeks (dependent upon progress) with goals of supervision.  He is home with his mom who can provide 24/7 supervision.  Of note, pt should qualify for Medicaid as his 47 y/o son lives with him.  I've notified financial counselor and [facility] to f/u with him.  He is agreeable to admit to CIR today.  Dr. Ashok Pall in agreement.  I will review with rehab MDs.   Estill Dooms, PT, DPT Admissions Coordinator [phone] 05/03/23  10:50 AM

## 2023-05-03 NOTE — Plan of Care (Signed)
  Problem: Consults Goal: RH STROKE PATIENT EDUCATION Description: See Patient Education module for education specifics  Outcome: Progressing   Problem: RH KNOWLEDGE DEFICIT Goal: RH STG INCREASE KNOWLEDGE OF STROKE PROPHYLAXIS Description: Patient will be able to manage secondary stroke risks using educational resources for medications and dietary modifications independently Outcome: Progressing

## 2023-05-03 NOTE — Progress Notes (Signed)
 Patient reported sharp stabbing pain left upper chest "inside" and was pointing around the monitor. He did not feel it was a reaction to adhesive. Reported that this has happened before without radiation and questioned if it could be anxiety but he wanted [place] to be aware. Symptoms resolved during our discussion. No chest wall tenderness. Lungs clear. Heart RRR.  EKG done showing incomplete right bundle--had Cards NP review and felt that no significant change noted/no recs.

## 2023-05-04 LAB — CBC WITH DIFFERENTIAL/PLATELET
Abs Immature Granulocytes: 0.04 10*3/uL (ref 0.00–0.07)
Basophils Absolute: 0.1 10*3/uL (ref 0.0–0.1)
Basophils Relative: 1 %
Eosinophils Absolute: 0.3 10*3/uL (ref 0.0–0.5)
Eosinophils Relative: 3 %
HCT: 46.2 % (ref 39.0–52.0)
Hemoglobin: 15.9 g/dL (ref 13.0–17.0)
Immature Granulocytes: 1 %
Lymphocytes Relative: 31 %
Lymphs Abs: 2.5 10*3/uL (ref 0.7–4.0)
MCH: 30.8 pg (ref 26.0–34.0)
MCHC: 34.4 g/dL (ref 30.0–36.0)
MCV: 89.4 fL (ref 80.0–100.0)
Monocytes Absolute: 0.9 10*3/uL (ref 0.1–1.0)
Monocytes Relative: 11 %
Neutro Abs: 4.2 10*3/uL (ref 1.7–7.7)
Neutrophils Relative %: 53 %
Platelets: 276 10*3/uL (ref 150–400)
RBC: 5.17 MIL/uL (ref 4.22–5.81)
RDW: 13.4 % (ref 11.5–15.5)
WBC: 7.9 10*3/uL (ref 4.0–10.5)
nRBC: 0 % (ref 0.0–0.2)

## 2023-05-04 LAB — COMPREHENSIVE METABOLIC PANEL
ALT: 26 U/L (ref 0–44)
AST: 17 U/L (ref 15–41)
Albumin: 3 g/dL — ABNORMAL LOW (ref 3.5–5.0)
Alkaline Phosphatase: 67 U/L (ref 38–126)
Anion gap: 9 (ref 5–15)
BUN: 14 mg/dL (ref 6–20)
CO2: 25 mmol/L (ref 22–32)
Calcium: 8.6 mg/dL — ABNORMAL LOW (ref 8.9–10.3)
Chloride: 106 mmol/L (ref 98–111)
Creatinine, Ser: 0.83 mg/dL (ref 0.61–1.24)
GFR, Estimated: >60 mL/min (ref >60)
Glucose, Bld: 116 mg/dL — ABNORMAL HIGH (ref 70–99)
Potassium: 4.5 mmol/L (ref 3.5–5.1)
Sodium: 140 mmol/L (ref 135–145)
Total Bilirubin: 0.3 mg/dL (ref 0.0–1.2)
Total Protein: 5.6 g/dL — ABNORMAL LOW (ref 6.5–8.1)

## 2023-05-04 MED ORDER — BUSPIRONE HCL 10 MG PO TABS
5.0000 mg | ORAL_TABLET | Freq: Three times a day (TID) | ORAL | Status: DC | PRN
  Administered 2023-05-04 – 2023-05-06 (×3): 5 mg via ORAL
  Filled 2023-05-04 (×3): qty 1

## 2023-05-04 NOTE — Progress Notes (Signed)
 PROGRESS NOTE   Subjective/Complaints: No new complaints this morning Continues to have anxiety and asks for medication to help, Buspar at home, said at home he smoked when he had anxiety  ROS: +anxiety   Objective:   No results found. Recent Labs    05/02/23 0610 05/04/23 0639  WBC  --  7.9  HGB 15.4 15.9  HCT 45.1 46.2  PLT 294 276   Recent Labs    05/04/23 0639  NA 140  K 4.5  CL 106  CO2 25  GLUCOSE 116*  BUN 14  CREATININE 0.83  CALCIUM 8.6*    Intake/Output Summary (Last 24 hours) at 05/04/2023 1844 Last data filed at 05/04/2023 6962 Gross per 24 hour  Intake --  Output 900 ml  Net -900 ml        Physical Exam: Vital Signs Blood pressure 132/82, pulse 61, temperature 98.1 F (36.7 C), temperature source Oral, resp. rate 18, height 5\' 7"  (1.702 m), weight 80.6 kg, SpO2 98%. Gen: no distress, normal appearing HEENT: oral mucosa pink and moist, NCAT Cardio: Reg rate Chest: normal effort, normal rate of breathing Abd: soft, non-distended Ext: no edema Psych: pleasant, normal affect Skin: intact MSK:      No apparent deformity.       Neurologic exam:  Cognition: AAO to person, place, time and event.  Language: Fluent, No substitutions or neoglisms. No dysarthria. Names 3/3 objects correctly.  Memory: Recalls 2/3 objects at 5 minutes. No apparent deficits  Insight: Good  insight into current condition.  Mood: Pleasant affect, appropriate mood.  Sensation: Right hemisensory loss to light touch, pain, and temperature RUE>RLE Reflexes: 2+ in BL UE and LEs. Negative Hoffman's and babinski signs bilaterally.  CN: + R V1-3 sensory loss.  Coordination: Mild RUE drift. No apparent tremors. No ataxia on FTN. LLE ataxia on HTS.  Spasticity: MAS 0 in all extremities.       Strength:                RUE: 5/5 SA, 5/5 EF, 5/5 EE, 5/5 WE, 5/5 FF, 5/5 FA                LUE:  5-/5 SA, 5/5 EF, 5/5 EE, 5/5 WE,  5/5 FF, 5-/5 FA                RLE: 4/5 HF, 5/5 KE, 5/5  DF, 5/5  EHL, 5/5  PF                 LLE:  4/5 HF, 5/5 KE, 5/5  DF, 5/5  EHL, 5/5  PF    Assessment/Plan: 1. Functional deficits which require 3+ hours per day of interdisciplinary therapy in a comprehensive inpatient rehab setting. Physiatrist is providing close team supervision and 24 hour management of active medical problems listed below. Physiatrist and rehab team continue to assess barriers to discharge/monitor patient progress toward functional and medical goals  Care Tool:  Bathing    Body parts bathed by patient: Right arm, Face, Left arm, Chest, Abdomen, Front perineal area, Buttocks, Right upper leg, Left upper leg, Right lower leg, Left lower leg  Bathing assist Assist Level: Supervision/Verbal cueing     Upper Body Dressing/Undressing Upper body dressing   What is the patient wearing?: Pull over shirt    Upper body assist Assist Level: Supervision/Verbal cueing    Lower Body Dressing/Undressing Lower body dressing      What is the patient wearing?: Underwear/pull up, Pants     Lower body assist Assist for lower body dressing: Contact Guard/Touching assist     Toileting Toileting    Toileting assist Assist for toileting: Contact Guard/Touching assist     Transfers Chair/bed transfer  Transfers assist     Chair/bed transfer assist level: Contact Guard/Touching assist     Locomotion Ambulation   Ambulation assist      Assist level: Moderate Assistance - Patient 50 - 74% Assistive device: Hand held assist Max distance: 100'   Walk 10 feet activity   Assist     Assist level: Minimal Assistance - Patient > 75% Assistive device: Hand held assist   Walk 50 feet activity   Assist    Assist level: Moderate Assistance - Patient - 50 - 74% Assistive device: Hand held assist    Walk 150 feet activity   Assist Walk 150 feet activity did not occur: Safety/medical  concerns         Walk 10 feet on uneven surface  activity   Assist     Assist level: Moderate Assistance - Patient - 50 - 74% Assistive device: Hand held assist   Wheelchair     Assist Is the patient using a wheelchair?: Yes (will be primary ambulator) Type of Wheelchair: Manual    Wheelchair assist level: Dependent - Patient 0%      Wheelchair 50 feet with 2 turns activity    Assist        Assist Level: Dependent - Patient 0%   Wheelchair 150 feet activity     Assist      Assist Level: Dependent - Patient 0%   Blood pressure 132/82, pulse 61, temperature 98.1 F (36.7 C), temperature source Oral, resp. rate 18, height 5\' 7"  (1.702 m), weight 80.6 kg, SpO2 98%.  Medical Problem List and Plan: 1. Functional deficits secondary to acute left lateral medullary infarct             -patient may shower             -ELOS/Goals: 12 to 14 days, modified independent PT/OT/SLP              - Stable for IRF admission   2.  Antithrombotics: -DVT/anticoagulation:  Pharmaceutical: Lovenox             -antiplatelet therapy: Aspirin and Plavix for three weeks followed by aspirin alone   3. Pain Management: Tylenol, Robaxin as needed             -continue gabapentin 100 mg q HS    4. Anxiety: Buspar added   5. Neuropsych/cognition: This patient is capable of making decisions on his own behalf.   6. Skin/Wound Care: Routine skin care checks   7. 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  Constipation.  No bowel movement since admission.   Dose of mag citrate on admission Continue twice daily Senokot-S.  10. Active smoker: discussed trying nicotine lozenges when he has cravings  11. Bradycardia: medications reviewed and is not on any rate lowering agents  LOS: 1 days A  FACE TO FACE EVALUATION WAS PERFORMED  Horton Chin 05/04/2023, 6:44 PM

## 2023-05-04 NOTE — Plan of Care (Signed)
  Problem: Consults Goal: RH STROKE PATIENT EDUCATION Description: See Patient Education module for education specifics  Outcome: Progressing   Problem: RH PAIN MANAGEMENT Goal: RH STG PAIN MANAGED AT OR BELOW PT'S PAIN GOAL Description: < 4 with prns Outcome: Progressing

## 2023-05-04 NOTE — Evaluation (Signed)
 Physical Therapy Assessment and Plan  Patient Details  Name: Charles Benitez MRN: [id number] Date of Birth: [birth year]  PT Diagnosis: Abnormality of gait, Ataxia, Ataxic gait, Coordination disorder, Difficulty walking, Dizziness and giddiness, Impaired cognition, Impaired sensation, Muscle weakness, and Pain in head Rehab Potential: Good ELOS: 10-12 days   Today's Date: 05/04/2023 PT Individual Time: 1308-6578 PT Individual Time Calculation (min): 70 min    Hospital Problem: Principal Problem:   CVA (cerebral vascular accident) [facility])   Past Medical History:  Past Medical History:  Diagnosis Date   Asthma    Chronic back pain    Past Surgical History: History reviewed. No pertinent surgical history.  Assessment & Plan Clinical Impression: Patient is a 48 y.o. year old male with recent admission to the hospital with PMH of sciatica who presented to [facility] ED on 05/01/23 with c/o left sided headache, left facial numbness, and BUE paresthesias. BP soft on arrival 103/82, vitals otherwise normal, labs notable for WBC 13.4, glucose 133, calcium 8.6. CT head negative and neurology consulted with concern for posterior circulation CVA not seen on CT. 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 Hospital course BP management. Neurology recommended asa 81 and plavix 75 QD x 3 weeks, then asa 81 monotherapy, hypercoag panel to be followed up on after d/c. Therapy evaluations were completed and pt was recommended for CIR.  Patient transferred to CIR on 05/03/2023 .   Patient currently requires mod with mobility secondary to muscle weakness and muscle joint tightness, decreased cardiorespiratoy endurance, impaired timing and sequencing, unbalanced muscle activation, ataxia, and decreased coordination, decreased visual acuity, decreased attention, decreased safety awareness, and decreased memory, and decreased sitting balance, decreased standing balance, decreased postural control, and decreased  balance strategies.  Prior to hospitalization, patient was independent  with mobility and lived with Family, Other (Comment) in a House home.  Home access is 1Stairs to enter.  Patient will benefit from skilled PT intervention to maximize safe functional mobility, minimize fall risk, and decrease caregiver burden for planned discharge home with intermittent assist.  Anticipate patient will benefit from follow up OP at discharge.  PT - End of Session Activity Tolerance: Decreased this session Endurance Deficit: Yes Endurance Deficit Description: "not feeling right" and some "headache" pain can be limiting at times PT Assessment Rehab Potential (ACUTE/IP ONLY): Good PT Patient demonstrates impairments in the following area(s): Balance;Behavior;Endurance;Motor;Pain;Safety;Sensory;Skin Integrity PT Transfers Functional Problem(s): Bed Mobility;Bed to Chair;Car;Furniture;Floor PT Locomotion Functional Problem(s): Ambulation;Stairs PT Plan PT Intensity: Minimum of 1-2 x/day ,45 to 90 minutes PT Frequency: 5 out of 7 days PT Duration Estimated Length of Stay: 10-12 days PT Treatment/Interventions: Ambulation/gait training;Balance/vestibular training;Cognitive remediation/compensation;Community reintegration;Discharge planning;Disease management/prevention;DME/adaptive equipment instruction;Neuromuscular re-education;Functional mobility training;Pain management;Patient/family education;Psychosocial support;Skin care/wound management;Splinting/orthotics;Stair training;Therapeutic Activities;Therapeutic Exercise;UE/LE Strength taining/ROM;UE/LE Coordination activities;Visual/perceptual remediation/compensation;Wheelchair propulsion/positioning PT Transfers Anticipated Outcome(s): mod I basic transfers PT Locomotion Anticipated Outcome(s): supervision gait and stairs PT Recommendation Recommendations for Other Services: Therapeutic Recreation consult;Neuropsych consult Therapeutic Recreation  Interventions: Stress management;Kitchen group;Outing/community reintergration Follow Up Recommendations: Outpatient PT Patient destination: Home Equipment Recommended: To be determined   PT Evaluation Precautions/Restrictions Precautions Precautions: Fall Precaution/Restrictions Comments: fall, L ataxia, R sensory impairments (temp and pain) Restrictions Weight Bearing Restrictions Per Provider Order: No  Pain Does not rate current pain but reports headache is ongoing and he feels anxious with chest palpitations at times (MD was aware). Pain Interference Pain Interference Pain Effect on Sleep: 2. Occasionally Pain Interference with Therapy Activities: 2. Occasionally Pain Interference with Day-to-Day Activities: 2. Occasionally Home  Living/Prior Functioning Home Living Available Help at Discharge: Available PRN/intermittently;Family Type of Home: House Home Access: Stairs to enter Secretary/administrator of Steps: 1 Entrance Stairs-Rails: Right Home Layout: Two level;Bed/bath upstairs Alternate Level Stairs-Number of Steps: flight Alternate Level Stairs-Rails: Right  Lives With: Family;Other (Comment) Prior Function Level of Independence: Independent with gait;Independent with transfers;Independent with basic ADLs;Independent with homemaking with ambulation  Able to Take Stairs?: Yes Driving: Yes Vocation:  (was about to start new job) Vision/Perception  Vision - History Baseline Vision: Other (comment) (doesnt wear glasses but thinks he needed them PTA) Perception Perception: Within Functional Limits Praxis Praxis: WFL  Cognition Overall Cognitive Status: Within Functional Limits for tasks assessed Orientation Level: Oriented X4 Year: 2025 Month: March Day of Week: Correct Attention: Sustained;Selective Sustained Attention: Impaired Sustained Attention Impairment: Verbal complex;Functional complex Selective Attention: Impaired Selective Attention Impairment:  Verbal complex;Functional complex Memory: Appears intact Awareness: Appears intact Problem Solving: Appears intact Executive Function: Sequencing;Organizing;Initiating;Self Monitoring;Self Correcting Sequencing: Appears intact Organizing: Appears intact Initiating: Appears intact Self Monitoring: Appears intact Self Correcting: Appears intact Behaviors: Impulsive Safety/Judgment: Appears intact Comments: baseline attention deficits Sensation Sensation Light Touch: Impaired Detail (reports not feeling pin prick on R) Light Touch Impaired Details: Impaired RUE;Impaired RLE (diminished in RUE and RLE, reports unable to feel pin prick) Hot/Cold: Impaired Detail Hot/Cold Impaired Details: Impaired RUE;Impaired RLE Proprioception:  (intact to positional testing but sometimes appears impaired with gait) Coordination Gross Motor Movements are Fluid and Coordinated: No Coordination and Movement Description: LLE mild ataxia with mobility, coordinated with MMT testing Motor  Motor Motor: Ataxia;Abnormal postural alignment and control;Motor impersistence Motor - Skilled Clinical Observations: R sensory impairments, L sided ataxia   Trunk/Postural Assessment  Cervical Assessment Cervical Assessment: Within Functional Limits Thoracic Assessment Thoracic Assessment: Within Functional Limits Lumbar Assessment Lumbar Assessment: Within Functional Limits Postural Control Postural Control: Deficits on evaluation Protective Responses: delayed  Balance Balance Balance Assessed: Yes Static Sitting Balance Static Sitting - Level of Assistance: 5: Stand by assistance Dynamic Sitting Balance Dynamic Sitting - Level of Assistance: 5: Stand by assistance Static Standing Balance Static Standing - Level of Assistance: 4: Min assist Dynamic Standing Balance Dynamic Standing - Level of Assistance: 3: Mod assist Extremity Assessment      RLE Assessment RLE Assessment: Within Functional  Limits General Strength Comments: grossly 4+/5; decreased muscular endurance LLE Assessment LLE Assessment: Exceptions to [facility] General Strength Comments: grossly 4+/5; decreased muscular endurance and decreased coordination/activation in walking demos decreased functional strength  Care Tool Care Tool Bed Mobility Roll left and right activity   Roll left and right assist level: Supervision/Verbal cueing    Sit to lying activity   Sit to lying assist level: Supervision/Verbal cueing    Lying to sitting on side of bed activity   Lying to sitting on side of bed assist level: the ability to move from lying on the back to sitting on the side of the bed with no back support.: Supervision/Verbal cueing     Care Tool Transfers Sit to stand transfer   Sit to stand assist level: Minimal Assistance - Patient > 75%    Chair/bed transfer   Chair/bed transfer assist level: Minimal Assistance - Patient > 75%    Car transfer   Car transfer assist level: Minimal Assistance - Patient > 75%      Care Tool Locomotion Ambulation   Assist level: Moderate Assistance - Patient 50 - 74% Assistive device: Hand held assist Max distance: 100'  Walk 10 feet activity  Assist level: Minimal Assistance - Patient > 75% Assistive device: Hand held assist   Walk 50 feet with 2 turns activity   Assist level: Moderate Assistance - Patient - 50 - 74% Assistive device: Hand held assist  Walk 150 feet activity Walk 150 feet activity did not occur: Safety/medical concerns      Walk 10 feet on uneven surfaces activity   Assist level: Moderate Assistance - Patient - 50 - 74% Assistive device: Hand held assist  Stairs   Assist level: Minimal Assistance - Patient > 75% Stairs assistive device: 2 hand rails Max number of stairs: 4  Walk up/down 1 step activity   Walk up/down 1 step (curb) assist level: Minimal Assistance - Patient > 75% Walk up/down 1 step or curb assistive device: 2 hand rails  Walk up/down  4 steps activity Walk up/down 4 steps activity did not occur: Safety/medical concerns      Walk up/down 12 steps activity Walk up/down 12 steps activity did not occur: Safety/medical concerns      Pick up small objects from floor   Pick up small object from the floor assist level: Moderate Assistance - Patient 50 - 74%    Wheelchair Is the patient using a wheelchair?: Yes (will be primary ambulator) Type of Wheelchair: Manual   Wheelchair assist level: Dependent - Patient 0%    Wheel 50 feet with 2 turns activity   Assist Level: Dependent - Patient 0%  Wheel 150 feet activity   Assist Level: Dependent - Patient 0%    Refer to Care Plan for Long Term Goals  SHORT TERM GOAL WEEK 1 PT Short Term Goal 1 (Week 1): Pt will perform basic transfers with supervision PT Short Term Goal 2 (Week 1): Pt will be able to perform gait x 150' with min assist PT Short Term Goal 3 (Week 1): Pt will be able to perform a flight of stairs with min assist for bedroom access  Recommendations for other services: Neuropsych and Therapeutic Recreation  Kitchen group, Stress management, and Outing/community reintegration  Skilled Therapeutic Intervention Evaluation completed (see details above and below) with education on PT POC and goals and individual treatment initiated with focus on bed mobility retraining, transfers without device, gait with and without AD, stair negotiation, and stroke recovery and stroke symptom education. Pt easily distractible throughout session, reporting this is his baseline but requires redirection to task and sometimes noted difficulty with answering the question asked due to getting tangential. Pt reports having high stress and anxiety prior to CVA. Education on type of CVA and results of his impairments. Pt does perform most mobility with min assist but up to mod for more dynamic standing and gait tasks due to LLE ataxia and decreased functional balance strategies. Min assist with  RW for gait x 100' with improved ability to self correct, but still requires up to min assist. End of session pt returned back to bed per request despite recommending to stay up for lunch. States his head continues to bother him at times. During bed mobility for sit to supine (and in car transfer), pt uses UE's to support and lift LLE despite it demonstrating adequate strength to do on his own.   Mobility Bed Mobility Bed Mobility: Rolling Left;Rolling Right;Supine to Sit;Sit to Supine Rolling Right: Supervision/verbal cueing Rolling Left: Supervision/Verbal cueing Supine to Sit: Supervision/Verbal cueing Sit to Supine: Supervision/Verbal cueing Transfers Transfers: Sit to Stand;Stand to Sit;Stand Pivot Transfers Sit to Stand: Minimal Assistance - Patient > 75%  Stand to Sit: Contact Guard/Touching assist Stand Pivot Transfers: Minimal Assistance - Patient > 75% Stand Pivot Transfer Details: Verbal cues for precautions/safety;Verbal cues for gait pattern;Tactile cues for weight shifting Locomotion  Gait Gait Distance (Feet): 100 Feet Assistive device: 1 person hand held assist Gait Gait Pattern: Impaired Stairs / Additional Locomotion Stairs: Yes Stairs Assistance: Minimal Assistance - Patient > 75% Stair Management Technique: Two rails;Step to pattern Number of Stairs: 4 Height of Stairs: 6 Ramp: Moderate Assistance - Patient 50 - 74% Wheelchair Mobility Wheelchair Mobility: No   Discharge Criteria: Patient will be discharged from PT if patient refuses treatment 3 consecutive times without medical reason, if treatment goals not met, if there is a change in medical status, if patient makes no progress towards goals or if patient is discharged from hospital.  The above assessment, treatment plan, treatment alternatives and goals were discussed and mutually agreed upon: by patient  Delorise Royals, PT, DPT, CBIS  05/04/2023, 12:11 PM

## 2023-05-04 NOTE — Evaluation (Addendum)
 Speech Language Pathology Assessment and Plan  Patient Details  Name: Charles Benitez MRN: [id number] Date of Birth: 1975/12/23  SLP Diagnosis:   n/a Rehab Potential:  n/a ELOS:   n/a  Today's Date: 05/04/2023 SLP Individual Time: 0272-5366 SLP Individual Time Calculation (min): 24 min  Hospital Problem: Principal Problem:   CVA (cerebral vascular accident) [facility])  Past Medical History:  Past Medical History:  Diagnosis Date   Asthma    Chronic back pain    Past Surgical History: History reviewed. No pertinent surgical history.  Assessment / Plan / Recommendation Clinical Impression  Pt is a 48 y/o with PMH of sciatica who presented to [facility] ED on 05/01/23 with c/o left sided headache, left facial numbness, and BUE paresthesias.  BP soft on arrival 103/82, vitals otherwise normal, labs notable for WBC 13.4, glucose 133, calcium 8.6.  CT head negative and neurology consulted with concern for posterior circulation CVA not seen on CT.   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  Hospital course BP management.  Neurology recommended asa 81 and plavix 75 QD x 3 weeks, then asa 81 monotherapy, hypercoag panel to be followed up on after d/c.  Therapy evaluations were completed and pt was recommended for CIR.   Cognitive-Linguistic Evaluation: Patient presents with cognitive linguistic function that is within normal limits and at baseline. Notably, patient exhibits mild selective attention deficits however reports that this is baseline and independently utilizes compensatory strategies successfully. Patient scored WFL across all COGINSTAT domains, states all deficits from stroke independently and asks appropriate questions re: timeline of recovery. Patient manages schedule utilizing provided external aid independently and discusses medication purposes with RN without hesitation/difficulty. No further ST services indicated. SLP will sign off. Patient left in bed with call bell in reach  and alarm set.   Swallowing: Formal swallow evaluation not conducted, as patient has been tolerating regular/thin liquid diet, passed Yale swallow screen in acute setting, and exhibits no clinical concern per chart review of diet intolerance. Reports at bedside no difficulty with meals and upon SLP entry, SLP noted 100% completion of breakfast items. No dysphagia intervention warranted at this time.    Skilled Therapeutic Interventions          SLP conducted skilled evaluation session to assess cognitive-linguistic function utilizing COGNISTAT and patient interview. Full results above.    SLP Assessment  Patient does not need any further Speech Lanaguage Pathology Services    Recommendations  Follow up Recommendations: None Equipment Recommended: None recommended by SLP    SLP Frequency  N/a  SLP Duration  SLP Intensity  SLP Treatment/Interventions  N/a   N/a   N/a   Pain Pain Assessment Pain Scale: Faces Pain Score: 4  Faces Pain Scale: Hurts little more Pain Location: Head Pain Intervention(s): Medication (See eMAR)  Prior Functioning Cognitive/Linguistic Baseline: Within functional limits Type of Home: House  Lives With: Family;Other (Comment);Son (mother) Available Help at Discharge: Family;Available PRN/intermittently Vocation: Unemployed  SLP Evaluation Cognition Overall Cognitive Status: Within Functional Limits for tasks assessed Orientation Level: Oriented X4 Year: 2025 Month: March Day of Week: Correct Attention: Sustained;Selective Sustained Attention: Impaired Sustained Attention Impairment: Verbal complex;Functional complex Selective Attention: Impaired Selective Attention Impairment: Verbal complex;Functional complex Memory: Appears intact Awareness: Appears intact Problem Solving: Appears intact Executive Function: Sequencing;Organizing;Initiating;Self Monitoring;Self Correcting Sequencing: Appears intact Organizing: Appears intact Initiating:  Appears intact Self Monitoring: Appears intact Self Correcting: Appears intact Behaviors: Impulsive Safety/Judgment: Appears intact Comments: baseline attention deficits  Comprehension Auditory Comprehension Overall  Auditory Comprehension: Appears within functional limits for tasks assessed Expression Expression Primary Mode of Expression: Verbal Verbal Expression Overall Verbal Expression: Appears within functional limits for tasks assessed  Care Tool Care Tool Cognition Ability to hear (with hearing aid or hearing appliances if normally used Ability to hear (with hearing aid or hearing appliances if normally used): 0. Adequate - no difficulty in normal conservation, social interaction, listening to TV   Expression of Ideas and Wants Expression of Ideas and Wants: 4. Without difficulty (complex and basic) - expresses complex messages without difficulty and with speech that is clear and easy to understand   Understanding Verbal and Non-Verbal Content Understanding Verbal and Non-Verbal Content: 4. Understands (complex and basic) - clear comprehension without cues or repetitions  Memory/Recall Ability Memory/Recall Ability : Current season;Location of own room;That he or she is in a hospital/hospital unit   Recommendations for other services: None   Discharge Criteria: Patient will be discharged from SLP if patient refuses treatment 3 consecutive times without medical reason, if treatment goals not met, if there is a change in medical status, if patient makes no progress towards goals or if patient is discharged from hospital.  The above assessment, treatment plan, treatment alternatives and goals were discussed and mutually agreed upon: by patient  Jeannie Done, M.A., CCC-SLP  Yetta Barre 05/04/2023, 8:36 AM

## 2023-05-05 DIAGNOSIS — R001 Bradycardia, unspecified: Secondary | ICD-10-CM

## 2023-05-05 DIAGNOSIS — H0014 Chalazion left upper eyelid: Secondary | ICD-10-CM

## 2023-05-05 LAB — PROTEIN C AG + PROTEIN S AG
Protein C, Total: 86 % (ref 60–150)
Protein S Ag, Free: 66 % (ref 61–136)
Protein S Ag, Total: 52 % — ABNORMAL LOW (ref 60–150)

## 2023-05-05 MED ORDER — ERYTHROMYCIN 5 MG/GM OP OINT
TOPICAL_OINTMENT | Freq: Three times a day (TID) | OPHTHALMIC | Status: AC
  Filled 2023-05-05: qty 3.5

## 2023-05-05 NOTE — Progress Notes (Signed)
 PROGRESS NOTE   Subjective/Complaints:  Pt doing well, slept well, pain well controlled. Has a ?stye in L upper eyelid, a little bothersome, hasn't had this before but has had times before where a "string of mucus" would come from his medial canthus.  LBM just now. Urinating fine. Denies any other complaints or concerns today.   ROS: +anxiety, +L eyelid stye. As per HPI. Denies CP, SOB, abd pain, N/V/D/C, or any other complaints at this time.     Objective:   No results found. Recent Labs    05/04/23 0639  WBC 7.9  HGB 15.9  HCT 46.2  PLT 276   Recent Labs    05/04/23 0639  NA 140  K 4.5  CL 106  CO2 25  GLUCOSE 116*  BUN 14  CREATININE 0.83  CALCIUM 8.6*    Intake/Output Summary (Last 24 hours) at 05/05/2023 1041 Last data filed at 05/05/2023 0800 Gross per 24 hour  Intake 952 ml  Output 2825 ml  Net -1873 ml        Physical Exam: Vital Signs Blood pressure (!) 147/131, pulse (!) 59, temperature 97.9 F (36.6 C), resp. rate 17, height 5\' 7"  (1.702 m), weight 80.6 kg, SpO2 100%.  Gen: no distress, normal appearing, sitting up at EOB HEENT: oral mucosa pink and moist, NCAT, L upper eyelid with small area of swelling near the medial canthus, no induration, very minimally tender, no erythema or warmth, no drainage.  Cardio: bradycardic, reg rhythm, no m/r/g appreciated Chest: normal effort, normal rate of breathing, CTAB Abd: soft, non-distended, nonTTP, +BS throughout Ext: no edema Psych: pleasant, normal affect Skin: intact over exposed surfaces MSK:      No apparent deformity. Ambulating with walker.       PRIOR EXAMS: Neurologic exam:  Cognition: AAO to person, place, time and event.  Language: Fluent, No substitutions or neoglisms. No dysarthria. Names 3/3 objects correctly.  Memory: Recalls 2/3 objects at 5 minutes. No apparent deficits  Insight: Good  insight into current condition.  Mood:  Pleasant affect, appropriate mood.  Sensation: Right hemisensory loss to light touch, pain, and temperature RUE>RLE Reflexes: 2+ in BL UE and LEs. Negative Hoffman's and babinski signs bilaterally.  CN: + R V1-3 sensory loss.  Coordination: Mild RUE drift. No apparent tremors. No ataxia on FTN. LLE ataxia on HTS.  Spasticity: MAS 0 in all extremities.       Strength:                RUE: 5/5 SA, 5/5 EF, 5/5 EE, 5/5 WE, 5/5 FF, 5/5 FA                LUE:  5-/5 SA, 5/5 EF, 5/5 EE, 5/5 WE, 5/5 FF, 5-/5 FA                RLE: 4/5 HF, 5/5 KE, 5/5  DF, 5/5  EHL, 5/5  PF                 LLE:  4/5 HF, 5/5 KE, 5/5  DF, 5/5  EHL, 5/5  PF    Assessment/Plan: 1. Functional deficits which require 3+ hours  per day of interdisciplinary therapy in a comprehensive inpatient rehab setting. Physiatrist is providing close team supervision and 24 hour management of active medical problems listed below. Physiatrist and rehab team continue to assess barriers to discharge/monitor patient progress toward functional and medical goals  Care Tool:  Bathing    Body parts bathed by patient: Right arm, Face, Left arm, Chest, Abdomen, Front perineal area, Buttocks, Right upper leg, Left upper leg, Right lower leg, Left lower leg         Bathing assist Assist Level: Supervision/Verbal cueing     Upper Body Dressing/Undressing Upper body dressing   What is the patient wearing?: Pull over shirt    Upper body assist Assist Level: Supervision/Verbal cueing    Lower Body Dressing/Undressing Lower body dressing      What is the patient wearing?: Underwear/pull up, Pants     Lower body assist Assist for lower body dressing: Contact Guard/Touching assist     Toileting Toileting    Toileting assist Assist for toileting: Contact Guard/Touching assist     Transfers Chair/bed transfer  Transfers assist     Chair/bed transfer assist level: Contact Guard/Touching assist      Locomotion Ambulation   Ambulation assist      Assist level: Moderate Assistance - Patient 50 - 74% Assistive device: Hand held assist Max distance: 100'   Walk 10 feet activity   Assist     Assist level: Minimal Assistance - Patient > 75% Assistive device: Hand held assist   Walk 50 feet activity   Assist    Assist level: Moderate Assistance - Patient - 50 - 74% Assistive device: Hand held assist    Walk 150 feet activity   Assist Walk 150 feet activity did not occur: Safety/medical concerns         Walk 10 feet on uneven surface  activity   Assist     Assist level: Moderate Assistance - Patient - 50 - 74% Assistive device: Hand held assist   Wheelchair     Assist Is the patient using a wheelchair?: Yes (will be primary ambulator) Type of Wheelchair: Manual    Wheelchair assist level: Dependent - Patient 0%      Wheelchair 50 feet with 2 turns activity    Assist        Assist Level: Dependent - Patient 0%   Wheelchair 150 feet activity     Assist      Assist Level: Dependent - Patient 0%   Blood pressure (!) 147/131, pulse (!) 59, temperature 97.9 F (36.6 C), resp. rate 17, height 5\' 7"  (1.702 m), weight 80.6 kg, SpO2 100%.  Medical Problem List and Plan: 1. 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  Antithrombotics: -DVT/anticoagulation:  Pharmaceutical: Lovenox 40mg  daily -antiplatelet therapy: Aspirin and Plavix for three weeks followed by aspirin alone   3. Pain Management: Tylenol, Robaxin as needed             -continue gabapentin 100 mg q HS    4. Mood/Behavior/Sleep/Anxiety: LCSW to evaluate and provide emotional support             -antipsychotic agents: n/a -05/04/23 Buspar 5mg  TID  PRN added   5. Neuropsych/cognition: This patient is capable  of making decisions on his own behalf.   6. Skin/Wound Care: Routine skin care checks   7. 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  Constipation.  No bowel movement since admission.   -Dose of mag citrate on admission -Continue twice daily Senokot-S. -05/05/23 LBM today finally, continue senokot for now but hopefully can d/c-- might be better to try softeners instead.   10. Active smoker: discussed trying nicotine lozenges when he has cravings  11. Bradycardia: medications reviewed and is not on any rate lowering agents  12. L eyelid discomfort: -05/05/23 small area of swelling to L upper eyelid, likely clogged tear duct vs stye, no evidence of infection but will start erythromycin ointment TID x5d and advised on cool compresses and washing eyelashes with baby shampoo. Monitor.     LOS: 2 days A FACE TO FACE EVALUATION WAS PERFORMED  [address] 05/05/2023, 10:41 AM

## 2023-05-05 NOTE — Progress Notes (Signed)
 Slept good. Complained at HS of eyes irritated/pressure-OS>OD. Not a new problem per patient. Zio patch in place with phone charging. [name] A

## 2023-05-05 NOTE — Progress Notes (Signed)
 Physical Therapy Session Note  Patient Details  Name: Charles Benitez MRN: [id number] Date of Birth: [birth year]  Today's Date: 05/05/2023 PT Individual Time: 0915-1000 PT Individual Time Calculation (min): 45 min   Short Term Goals: Week 1:  PT Short Term Goal 1 (Week 1): Pt will perform basic transfers with supervision PT Short Term Goal 2 (Week 1): Pt will be able to perform gait x 150' with min assist PT Short Term Goal 3 (Week 1): Pt will be able to perform a flight of stairs with min assist for bedroom access  Skilled Therapeutic Interventions/Progress Updates:   Pt received supine in bed. He reports not sleeping last night and being very tired. Reports absent pain and is agreeable to PT tx. States he wants to use RW today due to fatigue.  Supine to sit to supine MI; donning socks independent  GT: with RW x 102 ft with occasional L lateral body loss of control but pt able to self correct with PT mod A for correction and verbal cues for technique.  Pt reports L patella feeling like it needs to "pop/crack" with occasional stopping and bending L knee to get it to loosen up during gait.  PT assessed L patella supine with L patella hypermobile compared to R. No other significant findings with manual assessment.  Therex: Unable to perform L quad set but can perform L SLR without extension lag x 20; S/L hip abduction x 20 bil; S/L clam shell x 20 bil; Single leg bridge x 20 with contralateral LE SLR off of bed; supine hamstring stretch 2x30 sec. Standing cone toe taps L x 20 with PT min to mod A for balance to work on targeting and functional, purposeful movement of L LE. GT with RW x 102 ft back to room with pt reports of L knee feeling looser post therex and without feeling like it needs to crack. Gait more fluid  Pt left supine in bed; bed alarm on; no c/o pain; all needs within reach.      Therapy Documentation Precautions:  Precautions Precautions: Fall Precaution/Restrictions  Comments: fall, L ataxia, R side sensory impairments (temp and pain) Restrictions Weight Bearing Restrictions Per Provider Order: No   Therapy/Group: Individual Therapy  Luna Fuse 05/05/2023, 6:24 AM

## 2023-05-06 DIAGNOSIS — F419 Anxiety disorder, unspecified: Secondary | ICD-10-CM

## 2023-05-06 DIAGNOSIS — G464 Cerebellar stroke syndrome: Secondary | ICD-10-CM

## 2023-05-06 DIAGNOSIS — I69393 Ataxia following cerebral infarction: Secondary | ICD-10-CM

## 2023-05-06 DIAGNOSIS — F17200 Nicotine dependence, unspecified, uncomplicated: Secondary | ICD-10-CM

## 2023-05-06 LAB — FACTOR 5 LEIDEN

## 2023-05-06 LAB — BASIC METABOLIC PANEL
Anion gap: 8 (ref 5–15)
BUN: 13 mg/dL (ref 6–20)
CO2: 27 mmol/L (ref 22–32)
Calcium: 8.9 mg/dL (ref 8.9–10.3)
Chloride: 105 mmol/L (ref 98–111)
Creatinine, Ser: 0.74 mg/dL (ref 0.61–1.24)
GFR, Estimated: >60 mL/min (ref >60)
Glucose, Bld: 109 mg/dL — ABNORMAL HIGH (ref 70–99)
Potassium: 4.5 mmol/L (ref 3.5–5.1)
Sodium: 140 mmol/L (ref 135–145)

## 2023-05-06 LAB — CBC
HCT: 49.7 % (ref 39.0–52.0)
Hemoglobin: 16.6 g/dL (ref 13.0–17.0)
MCH: 30.5 pg (ref 26.0–34.0)
MCHC: 33.4 g/dL (ref 30.0–36.0)
MCV: 91.2 fL (ref 80.0–100.0)
Platelets: 310 10*3/uL (ref 150–400)
RBC: 5.45 MIL/uL (ref 4.22–5.81)
RDW: 13.3 % (ref 11.5–15.5)
WBC: 7.9 10*3/uL (ref 4.0–10.5)
nRBC: 0 % (ref 0.0–0.2)

## 2023-05-06 MED ORDER — ESCITALOPRAM OXALATE 10 MG PO TABS
5.0000 mg | ORAL_TABLET | Freq: Every day | ORAL | Status: DC
  Administered 2023-05-06 – 2023-05-10 (×5): 5 mg via ORAL
  Filled 2023-05-06 (×5): qty 1

## 2023-05-06 MED ORDER — BUSPIRONE HCL 10 MG PO TABS
5.0000 mg | ORAL_TABLET | Freq: Two times a day (BID) | ORAL | Status: DC
  Administered 2023-05-06 – 2023-05-10 (×8): 5 mg via ORAL
  Filled 2023-05-06 (×9): qty 1

## 2023-05-06 NOTE — Progress Notes (Signed)
 Occupational Therapy Session Note  Patient Details  Name: Charles Benitez MRN: [id number] Date of Birth: 23-Jan-1976  Today's Date: 05/06/2023 OT Individual Time: 9147-8295 OT Individual Time Calculation (min): 55 min    Short Term Goals: Week 1:  OT Short Term Goal 1 (Week 1): Pt will demonstrate improved dynamic standing balance while completing self care task requiring SBA for safety. OT Short Term Goal 2 (Week 1): Pt will demonstrate independence with self monitoring fatigue level in order to utilize rest breaks as needed when completing daily tasks.  Skilled Therapeutic Interventions/Progress Updates:     Pt received reclined in bed, dressed and ready for the day upon OT arrival. Pt presenting to be in good spirits receptive to skilled OT session reporting mild headache, however no other pain reported- OT offering intermittent rest breaks, repositioning, and therapeutic support to optimize participation in therapy session. RN in/out during session to provide morning medications. Focus this session on dual tasking, dynamic balance, VMC, and activity tolerance.   BADLs:  -Pt requesting to brush teeth at beginning of session. Pt transitioned to EOB supervision and completed functional mobility to sink without AD CGA. Engaged Pt in completing grooming/hygiene tasks in standing position for increased balance challenge. Pt able to groom hair, brush teeth, and apply deodorant with CGA required for balance, single UE supported on sink during tasks.   Mobility:  -Pt completed functional mobility <> therapy gym this session without AD for endurance training to increase overall activity tolerance for BADLs and IADls. Pt requiring CGA with occasional min A for balance when ambulating around corners or changing directs with intermittent scissoring of R LE noted.   Therapeutic Activity:  -Engaged Pt in completing dynamic standing balance dual tasking stepping activity to work on single leg balance,  lateral weight shifting, and coordination. Pt tasked with completing step taps to cone completing 2x10 reps on R/LE and then completing alternating R/L steps 2x12 reps. Pt with increased challenges balancing on L LE while stepping R LE requiring min HHA originally, fading to CGA with increased practice. Pt required increased time for motor planning during task and mod tactile cues for lateral weight shifting with seated rest break provided between and following trials.  -Pt completed dynamic balance dual tasking activity with quick changes in direction and overhead reaching incorporated into activity to address balance, reaction time, and activity tolerance deficits. Pt tasked with ambulating mat <> basket ball goal ~45ft while carrying clothes pins in each hand and then pinning blue/back (moderate and heavy resistance) clothes pins onto basketball goal net. Pt initially requiring min A for balance with intermittent LOB noted when changing directions, fading to CGA with increased practice. Pt completed total of 8 trials with seated rest break provided following. CGA provided for balance when reaching overhead, no dizziness reported.    -Pt completed dual tasking dynamic balance activity to work on activity tolerance, awareness, and balance to increase overall safety and independence for IADLs, Pt tasked with balancing bocce balls on cones while ambulating throughout busy therapy gym to simulate carrying items in home during IADLs. Pt able to complete task x2 trials with CGA required overall. During second trial, graded up challenge of task by incorporating backwards walking and side stepping with min A required 2/2 increased balance challenge.   Pt was left resting in bed with call bell in reach, bed alarm on, and all needs met.    Therapy Documentation Precautions:  Precautions Precautions: Fall Precaution/Restrictions Comments: fall, L ataxia, R side sensory  impairments (temp and  pain) Restrictions Weight Bearing Restrictions Per Provider Order: No   Therapy/Group: Individual Therapy  Clide Deutscher 05/06/2023, 7:52 AM

## 2023-05-06 NOTE — Discharge Summary (Signed)
 Physician Discharge Summary  Patient ID: Charles Benitez MRN: [id number] DOB/AGE: November 11, 1975 48 y.o.  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   CBC: No results for input(s): "WBC", "NEUTROABS", "HGB", "HCT", "MCV", "PLT" in the last 168 hours.   Brief HPI:   Charles Benitez is a 48 y.o. male in his usual state of excellent health who presented to the emergency department on 05/01/2023 complaining of left-sided headache with pain behind the left eye, left facial numbness and paresthesias of both hands with unsteady gait. He was outside tPA window. Symptoms improved upon arrival. 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 Neurology evaluated and advised DAPT for 21 days followed by aspirin monotherapy. His LDL was less than 70 so statin therapy was not initiated. Hypercoagulable panel ordered and the results are pending. Zio patch placed. TEE and OT evaluations obtained. The patient lives with his mother and 58 year old son and also provides care for his 106-year-old daughter regularly. During OT session, he demonstrated impaired ADL performance and functional mobility secondary to decreased functional use of his left upper and left lower extremities. He demonstrates decreased balance, decreased safety awareness and limited cognition in setting of his stroke. Is tolerating regular diet. Hemoglobin A1c 5.3%. Echocardiogram with bubble study: EF estimated 60 to 65%, no atrial level  shunt, no evidence of patent foramen ovale. No evidence of atrial septal defect.    Hospital Course: Charles Benitez was admitted to rehab 05/03/2023 for inpatient therapies to consist of PT, ST and OT at least three hours five days a week. Past admission physiatrist, therapy team and rehab RN have worked together to provide customized collaborative inpatient rehab.  On the day of admission, the patient reported sharp, stabbing pain in the left upper chest that did not radiate.  Patient concerned that this may be related to anxiety.  Symptoms quickly resolved.  EKG shows incomplete right bundle and cardiology nurse practitioner reviewed and felt no significant change and no new recommendations.  Continue to complain of anxiety and asking for medication.  Buspar 5 mg BID started and added Lexapro 5 mg daily. Discussed trying nicotine lozenges for tobacco cravings.  Erythromycin ointment 3 times daily for 5 days to left upper eyelid irritation.  Follow-up labs 3/03 within normal limits. Reduced hemibody sensation persisted. Neuropsychology consult on 3/07.  Blood pressures were monitored on TID basis and remained stable.  Rehab course: During patient's stay in rehab weekly team conferences were held to monitor patient's progress, set goals and discuss barriers to discharge. At admission, patient required mod assist with mobility and contact-guard to min assist with basic self-care skills.  He has had improvement in activity tolerance, balance, postural control as well as ability to compensate for deficits. He has had improvement in functional use RUE/LUE  and RLE/LLE as well as improvement in awareness.   Home with HEP.  Disposition: home with self-care  Diet: Heart healthy  Special Instructions: No driving, alcohol consumption or tobacco use.  Discharge Instructions     Ambulatory referral to Physical Medicine Rehab   Complete by: As directed  Hospital follow-up      Allergies as of  05/10/2023       Reactions   Shrimp [shellfish Allergy] Anaphylaxis        Medication List     STOP taking these medications    meclizine 25 MG tablet Commonly known as: ANTIVERT       TAKE these medications    acetaminophen 325 MG tablet Commonly known as: TYLENOL Take 1-2 tablets (325-650 mg total) by mouth every 4 (four) hours as needed for mild pain (pain score 1-3).   aspirin EC 81 MG tablet Take 1 tablet (81 mg total) by mouth daily. Swallow whole.   busPIRone 5 MG tablet Commonly known as: BUSPAR Take 1 tablet (5 mg total) by mouth 2 (two) times daily.   clopidogrel 75 MG tablet Commonly known as: PLAVIX Take 1 tablet (75 mg total) by mouth daily for 13 days.   escitalopram 5 MG tablet Commonly known as: LEXAPRO Tome 1 tableta (5 mg en total) por va oral diariamente. (Take 1 tablet (5 mg total) by mouth daily.)   gabapentin 100 MG capsule Commonly known as: NEURONTIN Tome 1 cpsula (100 mg en total) por va oral antes de acostarse. (Take 1 capsule (100 mg total) by mouth at bedtime.)   senna 8.6 MG Tabs tablet Commonly known as: SENOKOT Take 2 tablets (17.2 mg total) by mouth at bedtime as needed for mild constipation.        Follow-up Information     Smitty Cords, DO Follow up.   Specialty: Family Medicine Why: Call the office on Monday to make arrangements for hospital follow-up. Contact information: [address] [place] [address] [phone]         Erick Colace, MD Follow up.   Specialty: Physical Medicine and Rehabilitation Why: office will call you to arrange your appt (sent) Contact information: [address] [place] [address] [phone]         Lonell Face, MD Follow up.   Specialty: Neurology Why: Call the office on Monday to make arrangements for stroke follow-up. Contact information: [address] [facility] [place] [address] [phone]          Custovic, Rozell Searing, DO. Go to.   Specialty: Cardiology Why: Wednesday, June 12, 2023 at 3:45 pm Contact information: [address] [place] [address] [phone]                 Signed: Carlos Levering 05/15/2023, 12:47 PM

## 2023-05-06 NOTE — Progress Notes (Signed)
 Inpatient Rehabilitation Center Individual Statement of Services  Patient Name:  Laster Appling  Date:  05/06/2023  Welcome to the Inpatient Rehabilitation Center.  Our goal is to provide you with an individualized program based on your diagnosis and situation, designed to meet your specific needs.  With this comprehensive rehabilitation program, you will be expected to participate in at least 3 hours of rehabilitation therapies Monday-Friday, with modified therapy programming on the weekends.  Your rehabilitation program will include the following services:  Physical Therapy (PT), Occupational Therapy (OT), Speech Therapy (ST), 24 hour per day rehabilitation nursing, Neuropsychology, Care Coordinator, Rehabilitation Medicine, Nutrition Services, and Pharmacy Services  Weekly team conferences will be held on Wednesday to discuss your progress.  Your Inpatient Rehabilitation Care Coordinator will talk with you frequently to get your input and to update you on team discussions.  Team conferences with you and your family in attendance may also be held.  Expected length of stay: 10-12 days  Overall anticipated outcome: Independent with device  Depending on your progress and recovery, your program may change. Your Inpatient Rehabilitation Care Coordinator will coordinate services and will keep you informed of any changes. Your Inpatient Rehabilitation Care Coordinator's name and contact numbers are listed  below.  The following services may also be recommended but are not provided by the Inpatient Rehabilitation Center:  Driving Evaluations Home Health Rehabiltiation Services Outpatient Rehabilitation Services    Arrangements will be made to provide these services after discharge if needed.  Arrangements include referral to agencies that provide these services.  Your insurance has been verified to be:  none Your primary doctor is:  Iline Oven  Pertinent information will be shared with  your doctor and your insurance company.  Inpatient Rehabilitation Care Coordinator:  Dossie Der, [place] [phone] or Luna Glasgow  Information discussed with and copy given to patient by: Lucy Chris, 05/06/2023, 10:53 AM

## 2023-05-06 NOTE — Discharge Instructions (Addendum)
 Inpatient Rehab Discharge Instructions  Charles Benitez Discharge date and time:  05/10/2023  Activities/Precautions/ Functional Status: Activity: no lifting, driving, or strenuous exercise for until cleared by MD Diet: cardiac diet Wound Care: none needed Functional status:  ___ No restrictions     ___ Walk up steps independently ___ 24/7 supervision/assistance   ___ Walk up steps with assistance __x_ Intermittent supervision/assistance  ___ Bathe/dress independently ___ Walk with walker     ___ Bathe/dress with assistance ___ Walk Independently    ___ Shower independently ___ Walk with assistance    __x_ Shower with assistance __x_ No alcohol     ___ Return to work/school ________  Special Instructions: No driving, alcohol consumption or tobacco use.   COMMUNITY REFERRALS UPON DISCHARGE:    Home Exercise Program given to patient have referred to the [facility] in [place]  Medical Equipment/Items Ordered:na                                                 Agency/Supplier:na  Match placed for assistance with prescriptions for 30 days   STROKE/TIA DISCHARGE INSTRUCTIONS SMOKING Cigarette smoking nearly doubles your risk of having a stroke & is the single most alterable risk factor  If you smoke or have smoked in the last 12 months, you are advised to quit smoking for your health. Most of the excess cardiovascular risk related to smoking disappears within a year of stopping. Ask you doctor about anti-smoking medications [place] Quit Line: 1-800-QUIT NOW Free Smoking Cessation Classes [phone]  CHOLESTEROL Know your levels; limit fat & cholesterol in your diet  Lipid Panel     Component Value Date/Time   CHOL 139 05/02/2023 0515   TRIG 242 (H) 05/02/2023 0515   HDL 24 (L) 05/02/2023 0515   CHOLHDL 5.8 05/02/2023 0515   VLDL 48 (H) 05/02/2023 0515   LDLCALC 67 05/02/2023 0515     Many patients benefit from treatment even if their cholesterol is at goal. Goal: Total  Cholesterol (CHOL) less than 160 Goal:  Triglycerides (TRIG) less than 150 Goal:  HDL greater than 40 Goal:  LDL (LDLCALC) less than 100   BLOOD PRESSURE American Stroke Association blood pressure target is less that 120/80 mm/Hg  Your discharge blood pressure is:  BP: 117/80 Monitor your blood pressure Limit your salt and alcohol intake Many individuals will require more than one medication for high blood pressure  DIABETES (A1c is a blood sugar average for last 3 months) Goal HGBA1c is under 7% (HBGA1c is blood sugar average for last 3 months)  Diabetes: No known diagnosis of diabetes    Lab Results  Component Value Date   HGBA1C 5.3 05/01/2023    Your HGBA1c can be lowered with medications, healthy diet, and exercise. Check your blood sugar as directed by your physician Call your physician if you experience unexplained or low blood sugars.  PHYSICAL ACTIVITY/REHABILITATION Goal is 30 minutes at least 4 days per week  Activity: Increase activity slowly, Therapies: Physical Therapy: Outpatient and Occupational Therapy: Outpatient Return to work: when cleared by MD Activity decreases your risk of heart attack and stroke and makes your heart stronger.  It helps control your weight and blood pressure; helps you relax and can improve your mood. Participate in a regular exercise program. Talk with your doctor about the best form of exercise for you (  dancing, walking, swimming, cycling).  DIET/WEIGHT Goal is to maintain a healthy weight  Your discharge diet is:  Diet Order             Diet regular Room service appropriate? Yes; Fluid consistency: Thin  Diet effective now                  thin liquids Your height is:  Height: 5\' 7"  (170.2 cm) Your current weight is: Weight: 80.6 kg Your Body Mass Index (BMI) is:  BMI (Calculated): 27.82 Following the type of diet specifically designed for you will help prevent another stroke. Your goal weight range is:   Your goal Body Mass Index  (BMI) is 19-24. Healthy food habits can help reduce 3 risk factors for stroke:  High cholesterol, hypertension, and excess weight.  RESOURCES Stroke/Support Group:  Call [phone]   STROKE EDUCATION PROVIDED/REVIEWED AND GIVEN TO PATIENT Stroke warning signs and symptoms How to activate emergency medical system (call 911). Medications prescribed at discharge. Need for follow-up after discharge. Personal risk factors for stroke. Pneumonia vaccine given: No Flu vaccine given: No My questions have been answered, the writing is legible, and I understand these instructions.  I will adhere to these goals & educational materials that have been provided to me after my discharge from the hospital.      My questions have been answered and I understand these instructions. I will adhere to these goals and the provided educational materials after my discharge from the hospital.  Patient/Caregiver Signature _______________________________ Date __________  Clinician Signature _______________________________________ Date __________  Please bring this form and your medication list with you to all your follow-up doctor's appointments.

## 2023-05-06 NOTE — Progress Notes (Signed)
 Physical Therapy Session Note  Patient Details  Name: Charles Benitez MRN: [id number] Date of Birth: 1976/02/01  Today's Date: 05/06/2023 PT Individual Time: 0805-0900, 1351 - 1420 PT Individual Time Calculation (min): 55 min, 29 min  Short Term Goals: Week 1:  PT Short Term Goal 1 (Week 1): Pt will perform basic transfers with supervision PT Short Term Goal 2 (Week 1): Pt will be able to perform gait x 150' with min assist PT Short Term Goal 3 (Week 1): Pt will be able to perform a flight of stairs with min assist for bedroom access  Skilled Therapeutic Interventions/Progress Updates:     Session 1: Pt received supine in bed and agreeable to therapy. Pt has no c/o pain at this time.   Pt requests shower before leaving room. Bed mobility and donning socks seated EOB with supervision, sit<>stand and ambulate to shower chair with L HHA. Pt doffs short and pants standing with grab rail for support and SBA. Pt given verbal cue to sit on shower chair for duration of shower and sits initially, but then stands for second portion of shower - pt completes rest of shower with SBA in standing. Pt dons clean underwear and pants standing with grab bar support and CGA, pt cued to return to sitting EOB to don shirt with supervision.  Pt ambulates to dayroom without AD and CGA. Pt has tendency to list to the L and has intermittent self-corrected LOB. Pt encouraged to avoid using furniture/anything that moves as external balance support. Stand<>sit in armless chair in dayroom with SBA, verbal cues for reaching back for the chair. Pt mentioned sx of dizziness/lightheadedness similar to the feeling of a plane taking off, 1/10 on dizziness scale and BP at 131/85 mmHg.   3x16 step up/down on 6" step and CGA, alternating leading leg, to improve dynamic balance, stair training, and LE strength. First bout with L HHA, second with trekking poles, and 3rd without UE support and reactive stepping. Pt had occasional self  corrected LOB to L that increased with reactive stepping (instead of alternating R and L).  Pt notes he did not feel very secure with trekking poles.   Scavenger hunt around dayroom with number dots collecting in ascending order, CGA and verbal cues to get closer to dot before picking it up. Pt did well searching environment and navigating obstacles, but required some cueing for environmental safety (moving stools as opposed to narrowing BOS when walking between two obstacles).   Pt ambulated back to room with decreased L listing and CGA. Pt left supine in bed with all needs in reach and alarm on.    Session 2: Pt received supine in bed and agreeable to therapy. No c/o pain at this time.   Pt ambulates to dayroom with CGA, improved balance compared to this AM and pt able to visually scan environment while still maintaining balance. Pt mentions that this morning he thinks his medications interfered with his balance.  Agility Ladder exercises to improve dynamic balance and stepping strategy: CGA for all LOB except one requiring modA, pt displayed good medial/lateral hip strategy and stepping strategy, good use of arms for counterbalance. Cognitive dual task with SwitchedOn app - random prompts of a number or color displayed for 2.5s with 1s between each prompt. - step-to pattern in each square, with and without cognitive dual task - in/in, out/out each square - zig-zagging through ladder with in/in out/out pattern and in/in out alternating, with and without cognitive dual task - facing  ladder zig zagging in/in out/out - side stepping with and without cognitive dual task  Pt ambulated back to room and left supine in bed with alarm on, direct hand off to nsg.    Therapy Documentation Precautions:  Precautions Precautions: Fall Precaution/Restrictions Comments: fall, L ataxia, R side sensory impairments (temp and pain) Restrictions Weight Bearing Restrictions Per Provider Order:  No General:      Therapy/Group: Individual Therapy  Collins Scotland 05/06/2023, 10:09 AM

## 2023-05-06 NOTE — Progress Notes (Signed)
 Inpatient Rehabilitation Care Coordinator Assessment and Plan Patient Details  Name: Charles Benitez MRN: [id number] Date of Birth: [birth year]  Today's Date: 05/06/2023  Hospital Problems: Principal Problem:   CVA (cerebral vascular accident) [facility])  Past Medical History:  Past Medical History:  Diagnosis Date   Asthma    Chronic back pain    Past Surgical History: History reviewed. No pertinent surgical history. Social History:  reports that he has been smoking cigarettes. He has never used smokeless tobacco. He reports current alcohol use. He reports current drug use. Drug: Marijuana.  Family / Support Systems Marital Status: Single Patient Roles: Parent, Other (Comment) (son, friend) Children: has a 31 yo in the home and a 78 yo daughter whom he cares for regularly Other Supports: Enzo Montgomery [phone] Anticipated Caregiver: Mom Ability/Limitations of Caregiver: supervision only due to her own health issues Caregiver Availability: 24/7 Family Dynamics: Close with family and children he tries to be very involved with them. He was mod/i prior to admission and hopes to be when he leaves here.  Social History Preferred language: Spanish Religion:  Cultural Background: From [place] speaks spanish and english Education: HS Health Literacy - How often do you need to have someone help you when you read instructions, pamphlets, or other written material from your doctor or pharmacy?: Never Writes: Yes Employment Status: Unemployed Marine scientist Issues: No issues Guardian/Conservator: None-according to MD pt is capable of makng his own decisions while here   Abuse/Neglect Abuse/Neglect Assessment Can Be Completed: Yes Physical Abuse: Denies Verbal Abuse: Denies Sexual Abuse: Denies Exploitation of patient/patient's resources: Denies Self-Neglect: Denies  Patient response to: Social Isolation - How often do you feel lonely or isolated from those around you?:  Never  Emotional Status Pt's affect, behavior and adjustment status: Pt is motviated to do well and recover from his stroke. He is mkaing progress and feels is doing better. At times he has back pain but had this prior to admission. Recent Psychosocial Issues: back issues and CAD-family history Psychiatric History: No hx-issues he may benefit from seeing neuro-psych while here due to young and for coping. He does seem to be positive and works hard in therapies Substance Abuse History: No issues  Patient / Family Perceptions, Expectations & Goals Pt/Family understanding of illness & functional limitations: Pt is able to explain his stroke and deficits he is making progress and talks with the MD's involved in his care. He feels her understands his plan moving forward and is ready to do therapies Premorbid pt/family roles/activities: dad, son, friend, etc Anticipated changes in roles/activities/participation: resume Pt/family expectations/goals: Pt states: " I hope to recover and be able to do for myself when I leave here."  [employer]: None Premorbid Home Care/DME Agencies: None Transportation available at discharge: friends and family Is the patient able to respond to transportation needs?: Yes In the past 12 months, has lack of transportation kept you from medical appointments or from getting medications?: No In the past 12 months, has lack of transportation kept you from meetings, work, or from getting things needed for daily living?: No Resource referrals recommended: Neuropsychology  Discharge Planning Living Arrangements: Parent, Children Support Systems: Parent, Children, Friends/neighbors, Other relatives Type of Residence: Private residence Insurance Resources: Customer service manager Resources: Family Support Financial Screen Referred: Yes Living Expenses: Lives with family Money Management: Family Does the patient have any problems obtaining your  medications?: No Home Management: both mom and himself Patient/Family Preliminary Plans: Return home with mom and 58 yo.  Mom can provide supervision level only due to health issues of her own. Pt should be mod/i by dischsrge according to goals set for stay here Will see if can get charity equipment and work on [facility] eligibility Care Coordinator Barriers to Discharge: Decreased caregiver support, Insurance for SNF coverage, Other (comments) Care Coordinator Anticipated Follow Up Needs: HH/OP  Clinical Impression Pleasant gentleman who is motivated to do well and recover from this stroke. He will need to be mod/I due to mom has health issues whom he lives with. Will work with financial-med source to see if eligible for medicaid due to minor in the home. Update Wednesday after team conference.  Lucy Chris 05/06/2023, 11:39 AM

## 2023-05-06 NOTE — Progress Notes (Signed)
 Physical Therapy Session Note  Patient Details  Name: Charles Benitez MRN: [id number] Date of Birth: 01/01/1976  Today's Date: 05/06/2023 PT Individual Time: 1501-1530 PT Individual Time Calculation (min): 29 min   Short Term Goals: Week 1:  PT Short Term Goal 1 (Week 1): Pt will perform basic transfers with supervision PT Short Term Goal 2 (Week 1): Pt will be able to perform gait x 150' with min assist PT Short Term Goal 3 (Week 1): Pt will be able to perform a flight of stairs with min assist for bedroom access  Skilled Therapeutic Interventions/Progress Updates:      Pt supine in bed upon arrival, pt agreeable to therpay. Pt denies any pain.   Bed mobility with mod I.   Sit to stand with CGA/supervision and no AD.   Gait room to main gym with no AD and CGA, with intermittent light min A needed 2/2 L LE catching.   Pt performed mass practice of stair navigation, first with use of R ascending handrail/L descending for home set up (to access bed room) and step to gait and CGA/supervision, verbal cues provided for sequencing. Progressed to no handrails and reciprocal gait with CGA for ascending and CGA/min A for descending.   Pt navigated 6 and 11 inch hurdles with no AD, 1x10 leading with L LE and CGA, 1x10 leading with R LE and CGA/min A 2/2 1 episode of LOB with decreased clearance R LE. Progressed to 1x10 with reciprocal gait, and CGA.   Pt performed lateral stepping, up and down ramp to improved B LE strenthening and stepping strategy for balance recovery. Pt performed with no AD and CGA progressing to supervision.   Pt supine in ebd at end of session with all needs within reach and bed alarm on.   Therapy Documentation Precautions:  Precautions Precautions: Fall Precaution/Restrictions Comments: fall, L ataxia, R side sensory impairments (temp and pain) Restrictions Weight Bearing Restrictions Per Provider Order: No  Therapy/Group: Individual Therapy  [facility] Ambrose Finland, [place], DPT  05/06/2023, 7:56 AM

## 2023-05-06 NOTE — Progress Notes (Signed)
 Inpatient Rehabilitation  Patient information reviewed and entered into eRehab system by [facility]. Karen Kays., CCC/SLP, PPS Coordinator.  Information including medical coding, functional ability and quality indicators will be reviewed and updated through discharge.

## 2023-05-06 NOTE — Progress Notes (Addendum)
 PROGRESS NOTE   Subjective/Complaints:  No issues overnite , no eyelid c/os Discussed chronic anxiety , smoking hx as well as THC use   ROS: +anxiety,  As per HPI. Denies CP, SOB, abd pain, N/V/D/C, or any other complaints at this time.     Objective:   No results found. Recent Labs    05/04/23 0639 05/06/23 0517  WBC 7.9 7.9  HGB 15.9 16.6  HCT 46.2 49.7  PLT 276 310   Recent Labs    05/04/23 0639 05/06/23 0517  NA 140 140  K 4.5 4.5  CL 106 105  CO2 25 27  GLUCOSE 116* 109*  BUN 14 13  CREATININE 0.83 0.74  CALCIUM 8.6* 8.9    Intake/Output Summary (Last 24 hours) at 05/06/2023 1156 Last data filed at 05/06/2023 0737 Gross per 24 hour  Intake 814 ml  Output 750 ml  Net 64 ml        Physical Exam: Vital Signs Blood pressure (!) 144/78, pulse (!) 57, temperature 98 F (36.7 C), resp. rate 17, height 5\' 7"  (1.702 m), weight 80.6 kg, SpO2 96%.  Gen: no distress, normal appearing, sitting up at EOB  General: No acute distress Mood and affect are appropriate Heart: Regular rate and rhythm no rubs murmurs or extra sounds Lungs: Clear to auscultation, breathing unlabored, no rales or wheezes Abdomen: Positive bowel sounds, soft nontender to palpation, nondistended Extremities: No clubbing, cyanosis, or edema Skin: No evidence of breakdown, no evidence of rash   Neurologic exam:  Cognition: AAO to person, place, time and event.  Language: Fluent, No substitutions or neoglisms. No dysarthria.Insight: Good  insight into current condition.  Mood: Pleasant affect, appropriate mood.  Sensation: Right hemisensory loss to light touch, pain, and temperature RUE>RLE  CN: + L V3 sensory loss.  Coordination: Mild RUE drift. No apparent tremors. No ataxia on FTN. LLE ataxia on HTS.  Spasticity: MAS 0 in all extremities.       Strength:                RUE: 5/5 SA, 5/5 EF, 5/5 EE, 5/5 WE, 5/5 FF, 5/5 FA                 LUE:  5-/5 SA, 5/5 EF, 5/5 EE, 5/5 WE, 5/5 FF, 5-/5 FA                RLE: 5/5 HF, 5/5 KE, 5/5  DF, 5/5  EHL, 5/5  PF                 LLE:  4/5 HF, 5/5 KE, 5/5  DF, 5/5  EHL, 5/5  PF    Assessment/Plan: 1. Functional deficits which require 3+ hours per day of interdisciplinary therapy in a comprehensive inpatient rehab setting. Physiatrist is providing close team supervision and 24 hour management of active medical problems listed below. Physiatrist and rehab team continue to assess barriers to discharge/monitor patient progress toward functional and medical goals  Care Tool:  Bathing    Body parts bathed by patient: Right arm, Face, Left arm, Chest, Abdomen, Front perineal area, Buttocks, Right upper leg, Left upper leg, Right lower  leg, Left lower leg         Bathing assist Assist Level: Supervision/Verbal cueing     Upper Body Dressing/Undressing Upper body dressing   What is the patient wearing?: Pull over shirt    Upper body assist Assist Level: Supervision/Verbal cueing    Lower Body Dressing/Undressing Lower body dressing      What is the patient wearing?: Underwear/pull up, Pants     Lower body assist Assist for lower body dressing: Contact Guard/Touching assist     Toileting Toileting    Toileting assist Assist for toileting: Contact Guard/Touching assist     Transfers Chair/bed transfer  Transfers assist     Chair/bed transfer assist level: Contact Guard/Touching assist     Locomotion Ambulation   Ambulation assist      Assist level: Moderate Assistance - Patient 50 - 74% Assistive device: Hand held assist Max distance: 100'   Walk 10 feet activity   Assist     Assist level: Minimal Assistance - Patient > 75% Assistive device: Hand held assist   Walk 50 feet activity   Assist    Assist level: Moderate Assistance - Patient - 50 - 74% Assistive device: Hand held assist    Walk 150 feet activity   Assist Walk 150 feet  activity did not occur: Safety/medical concerns         Walk 10 feet on uneven surface  activity   Assist     Assist level: Moderate Assistance - Patient - 50 - 74% Assistive device: Hand held assist   Wheelchair     Assist Is the patient using a wheelchair?: Yes (will be primary ambulator) Type of Wheelchair: Manual    Wheelchair assist level: Dependent - Patient 0%      Wheelchair 50 feet with 2 turns activity    Assist        Assist Level: Dependent - Patient 0%   Wheelchair 150 feet activity     Assist      Assist Level: Dependent - Patient 0%   Blood pressure (!) 144/78, pulse (!) 57, temperature 98 F (36.7 C), resp. rate 17, height 5\' 7"  (1.702 m), weight 80.6 kg, SpO2 96%.  Medical Problem List and Plan: 1. 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  Antithrombotics: -DVT/anticoagulation:  Pharmaceutical: Lovenox 40mg  daily -antiplatelet therapy: Aspirin and Plavix for three weeks followed by aspirin alone   3. Pain Management: Tylenol, Robaxin as needed             -continue gabapentin 100 mg q HS    4. Mood/Behavior/Sleep/Anxiety: LCSW to evaluate and provide emotional support             -antipsychotic agents: n/a -05/04/23 Buspar 5mg  TID PRN added- taking only daily increase to BID and add lexapro 5mg  every day    5. Neuropsych/cognition: This patient is capable of making decisions on his own behalf.   6. Skin/Wound Care: Routine skin care checks   7. 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  Constipation.  No bowel movement since admission.   -Dose  of mag citrate on admission -Continue twice daily Senokot-S. -05/05/23 LBM today finally, continue senokot for now but  hopefully can d/c-- might be better to try softeners instead.   10. Active smoker: discussed trying nicotine lozenges when he has cravings  11. Bradycardia: medications reviewed and is not on any rate lowering agents  12. L eyelid discomfort: Resolved     LOS: 3 days A FACE TO FACE EVALUATION WAS PERFORMED  Erick Colace 05/06/2023, 11:56 AM

## 2023-05-06 NOTE — Progress Notes (Signed)
 Occupational Therapy Session Note  Patient Details  Name: Charles Benitez MRN: [id number] Date of Birth: 1976/01/16  Today's Date: 05/06/2023 OT Individual Time: 4098-1191 OT Individual Time Calculation (min): 31 min    Short Term Goals: Week 1:  OT Short Term Goal 1 (Week 1): Pt will demonstrate improved dynamic standing balance while completing self care task requiring SBA for safety. OT Short Term Goal 2 (Week 1): Pt will demonstrate independence with self monitoring fatigue level in order to utilize rest breaks as needed when completing daily tasks.  Skilled Therapeutic Interventions/Progress Updates:    Patient agreeable to participate in OT session. Reports 0/10 pain level.   Pt engaged in dynamic balance and coordination training by ambulating along a straight line while performing a dual-task card sort. OT provided VC for foot placement, and minimal tactile assist for weight shifting in enhance postural stability in order to focus on motor ataxia in the left foot. Cognitive task integration was facilitated through prompts to maintain focus on sorting accuracy. Increased intensity of task by adding 2# ankles weights and hurdles along line. Min A provided for balance as needed.   Pt performed functional mobility while navigating to/from room and day room for therapy session without use of AD and close SBA.   Therapy Documentation Precautions:  Precautions Precautions: Fall Precaution/Restrictions Comments: fall, L ataxia, R side sensory impairments (temp and pain) Restrictions Weight Bearing Restrictions Per Provider Order: No G  Therapy/Group: Individual Therapy  Limmie Patricia, OTR/L,CBIS  Supplemental OT - [facility] and [facility] Secure Chat Preferred   05/06/2023, 8:46 AM

## 2023-05-06 NOTE — IPOC Note (Signed)
 Overall Plan of Care [facility]) Patient Details Name: Charles Benitez MRN: [id number] DOB: Feb 03, 1976  Admitting Diagnosis: CVA (cerebral vascular accident) [facility])  Hospital Problems: Principal Problem:   CVA (cerebral vascular accident) (HCC)     Functional Problem List: Nursing Safety, Endurance, Medication Management, Pain  PT Balance, Behavior, Endurance, Motor, Pain, Safety, Sensory, Skin Integrity  OT Balance, Sensory, Cognition, Endurance, Motor, Safety  SLP    TR         Basic ADL's: OT Bathing, Dressing, Toileting     Advanced  ADL's: OT Light Housekeeping     Transfers: PT Bed Mobility, Bed to Chair, Car, [employer], Dietitian, Technical brewer: PT Ambulation, Stairs     Additional Impairments: OT Fuctional Use of Upper Extremity  SLP        TR      Anticipated Outcomes Item Anticipated Outcome  Self Feeding    Swallowing      Basic self-care  Mod I  Toileting  Mod I   Bathroom Transfers Mod I  Bowel/Bladder  n/a  Transfers  mod I basic transfers  Locomotion  supervision gait and stairs  Communication     Cognition     Pain  < 4 with prns  Safety/Judgment  manage safety w cues   Therapy Plan: PT Intensity: Minimum of 1-2 x/day ,45 to 90 minutes PT Frequency: 5 out of 7 days PT Duration Estimated Length of Stay: 10-12 days OT Intensity: Minimum of 1-2 x/day, 45 to 90 minutes OT Frequency: 5 out of 7 days OT Duration/Estimated Length of Stay: 10-12 days     Team Interventions: Nursing Interventions Pain Management, Patient/Family Education, Disease Management/Prevention, Discharge Planning, Medication Management  PT interventions Ambulation/gait training, Warden/ranger, Cognitive remediation/compensation, Community reintegration, Discharge planning, Disease management/prevention, DME/adaptive equipment instruction, Neuromuscular re-education, Functional mobility training, Pain management, Patient/family  education, Psychosocial support, Skin care/wound management, Splinting/orthotics, Stair training, Therapeutic Activities, Therapeutic Exercise, UE/LE Strength taining/ROM, UE/LE Coordination activities, Visual/perceptual remediation/compensation, Wheelchair propulsion/positioning  OT Interventions UE/LE Strength taining/ROM, Wheelchair propulsion/positioning, Therapeutic Exercise, Self Care/advanced ADL retraining, Neuromuscular re-education, Warden/ranger, Disease mangement/prevention, Cognitive remediation/compensation, DME/adaptive equipment instruction, Pain management, UE/LE Coordination activities, Patient/family education, Community reintegration, Discharge planning, Functional mobility training, Psychosocial support, Therapeutic Activities, Visual/perceptual remediation/compensation  SLP Interventions    TR Interventions    SW/CM Interventions Discharge Planning, Psychosocial Support, Patient/Family Education   Barriers to Discharge MD   BP management , smoking cessation   Nursing Home environment access/layout, Decreased caregiver support 2 level B+B upstairs right rail w minor child; patient's mother will assist as needed  PT      OT      SLP      SW Decreased caregiver support, Community education officer for SNF coverage, Other (comments)     Team Discharge Planning: Destination: PT-Home ,OT- Home , SLP- Home Projected Follow-up: PT-Outpatient PT, OT-  Outpatient OT, SLP-None Projected Equipment Needs: PT-To be determined, OT- Tub/shower seat, To be determined, SLP-None recommended by SLP Equipment Details: PT- , OT-  Patient/family involved in discharge planning: PT- Patient,  OT-Patient, SLP-Patient  MD ELOS: 7d Medical Rehab Prognosis:  Good Assessment: The patient has been admitted for CIR therapies with the diagnosis of CVA. The team will be addressing functional mobility, strength, stamina, balance, safety, adaptive techniques and equipment, self-care, bowel and bladder mgt,  patient and caregiver education, anxiety management, stroke risk factor reduction . Goals have been set at mod I. Anticipated discharge destination is home .  See Team Conference Notes for weekly updates to the plan of care

## 2023-05-06 NOTE — Progress Notes (Addendum)
 Patient ID: Charles Benitez, male   DOB: [birth year], 48 y.o.   MRN: [id number] Met with the patient to review current medical situation, rehab process, team conference and plan of care. Discussed secondary risk management including high triglycerides/HH diet with smoking. Reported high stress level PTA, out of work for several months, child issues, tax issues, etc. Reported understanding of Buspar for anxiety and smoking cessation rationale. Reviewed DAPT x 3 wks then ASA solo. ZIO patch intact; through 06/12/23. Patient has an appointment arranged with the [facility] and confirmed pharmacy is [facility] in [place].  Reported insomnia; Melatonin available prn and constipation on senokot, miralax; requested mag citrate available prn. Continue to follow along to address educational needs to facilitate preparation for discharge. [name]

## 2023-05-07 NOTE — Progress Notes (Signed)
 PROGRESS NOTE   Subjective/Complaints:  Still notes reduced sensation RIght hemibody  Bowels moved yesterday  Concerned about return to work   ROS: +anxiety,  As per HPI. Denies CP, SOB, abd pain, N/V/D/C, or any other complaints at this time.     Objective:   No results found. Recent Labs    05/06/23 0517  WBC 7.9  HGB 16.6  HCT 49.7  PLT 310   Recent Labs    05/06/23 0517  NA 140  K 4.5  CL 105  CO2 27  GLUCOSE 109*  BUN 13  CREATININE 0.74  CALCIUM 8.9    Intake/Output Summary (Last 24 hours) at 05/07/2023 0905 Last data filed at 05/07/2023 0732 Gross per 24 hour  Intake 574 ml  Output 750 ml  Net -176 ml        Physical Exam: Vital Signs Blood pressure 127/76, pulse 64, temperature 98.1 F (36.7 C), temperature source Oral, resp. rate 18, height 5\' 7"  (1.702 m), weight 80.6 kg, SpO2 99%.  Gen: no distress, normal appearing, sitting up at EOB  General: No acute distress Mood and affect are appropriate Heart: Regular rate and rhythm no rubs murmurs or extra sounds Lungs: Clear to auscultation, breathing unlabored, no rales or wheezes Abdomen: Positive bowel sounds, soft nontender to palpation, nondistended Extremities: No clubbing, cyanosis, or edema Skin: No evidence of breakdown, no evidence of rash   Neurologic exam:  Cognition: AAO to person, place, time and event.  Language: Fluent, No substitutions or neoglisms. No dysarthria.Insight: Good  insight into current condition.  Mood: Pleasant affect, appropriate mood.  Sensation: Right hemisensory loss to light touch, pain, and temperature RUE>RLE  CN: + L V3 sensory loss.  Coordination: Mild RUE drift. No apparent tremors. No ataxia on FTN. LLE ataxia on HTS.  Spasticity: MAS 0 in all extremities.       Strength:                RUE: 5/5 SA, 5/5 EF, 5/5 EE, 5/5 WE, 5/5 FF, 5/5 FA                LUE:  5-/5 SA, 5/5 EF, 5/5 EE, 5/5 WE, 5/5  FF, 5-/5 FA                RLE: 5/5 HF, 5/5 KE, 5/5  DF, 5/5  EHL, 5/5  PF                 LLE:  4/5 HF, 5/5 KE, 5/5  DF, 5/5  EHL, 5/5  PF    Assessment/Plan: 1. Functional deficits which require 3+ hours per day of interdisciplinary therapy in a comprehensive inpatient rehab setting. Physiatrist is providing close team supervision and 24 hour management of active medical problems listed below. Physiatrist and rehab team continue to assess barriers to discharge/monitor patient progress toward functional and medical goals  Care Tool:  Bathing    Body parts bathed by patient: Right arm, Face, Left arm, Chest, Abdomen, Front perineal area, Buttocks, Right upper leg, Left upper leg, Right lower leg, Left lower leg         Bathing assist Assist Level: Supervision/Verbal  cueing     Upper Body Dressing/Undressing Upper body dressing   What is the patient wearing?: Pull over shirt    Upper body assist Assist Level: Supervision/Verbal cueing    Lower Body Dressing/Undressing Lower body dressing      What is the patient wearing?: Underwear/pull up, Pants     Lower body assist Assist for lower body dressing: Contact Guard/Touching assist     Toileting Toileting    Toileting assist Assist for toileting: Contact Guard/Touching assist     Transfers Chair/bed transfer  Transfers assist     Chair/bed transfer assist level: Contact Guard/Touching assist     Locomotion Ambulation   Ambulation assist      Assist level: Moderate Assistance - Patient 50 - 74% Assistive device: Hand held assist Max distance: 100'   Walk 10 feet activity   Assist     Assist level: Minimal Assistance - Patient > 75% Assistive device: Hand held assist   Walk 50 feet activity   Assist    Assist level: Moderate Assistance - Patient - 50 - 74% Assistive device: Hand held assist    Walk 150 feet activity   Assist Walk 150 feet activity did not occur: Safety/medical  concerns         Walk 10 feet on uneven surface  activity   Assist     Assist level: Moderate Assistance - Patient - 50 - 74% Assistive device: Hand held assist   Wheelchair     Assist Is the patient using a wheelchair?: Yes (will be primary ambulator) Type of Wheelchair: Manual    Wheelchair assist level: Dependent - Patient 0%      Wheelchair 50 feet with 2 turns activity    Assist        Assist Level: Dependent - Patient 0%   Wheelchair 150 feet activity     Assist      Assist Level: Dependent - Patient 0%   Blood pressure 127/76, pulse 64, temperature 98.1 F (36.7 C), temperature source Oral, resp. rate 18, height 5\' 7"  (1.702 m), weight 80.6 kg, SpO2 99%.  Medical Problem List and Plan: 1. 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  Antithrombotics: -DVT/anticoagulation:  Pharmaceutical: Lovenox 40mg  daily -antiplatelet therapy: Aspirin and Plavix for three weeks followed by aspirin alone   3. Pain Management: Tylenol, Robaxin as needed             -continue gabapentin 100 mg q HS    4. Mood/Behavior/Sleep/Anxiety: LCSW to evaluate and provide emotional support             -antipsychotic agents: n/a -05/04/23 Buspar 5mg  TID PRN added- taking only daily increase to BID and add lexapro 5mg  every day Feeling less stressed today     5. Neuropsych/cognition: This patient is capable of making decisions on his own behalf.   6. Skin/Wound Care: Routine skin care checks   7. 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  Constipation.  No bowel movement since admission.   -Dose of mag citrate on admission -Continue twice  daily Senokot-S. -05/05/23 LBM today finally,  continue senokot for now but hopefully can d/c-- might be better to try softeners instead.   10. Active smoker: discussed trying nicotine lozenges when he has cravings  11. Bradycardia: medications reviewed and is not on any rate lowering agents  12. L eyelid discomfort: Resolved     LOS: 4 days A FACE TO FACE EVALUATION WAS PERFORMED  Erick Colace 05/07/2023, 9:05 AM

## 2023-05-07 NOTE — Progress Notes (Signed)
 Physical Therapy Session Note  Patient Details  Name: Charles Benitez MRN: [id number] Date of Birth: [birth year]  Today's Date: 05/07/2023 PT Individual Time: 0805-0902 PT Individual Time Calculation (min): 57 min   Short Term Goals: Week 1:  PT Short Term Goal 1 (Week 1): Pt will perform basic transfers with supervision PT Short Term Goal 2 (Week 1): Pt will be able to perform gait x 150' with min assist PT Short Term Goal 3 (Week 1): Pt will be able to perform a flight of stairs with min assist for bedroom access  Skilled Therapeutic Interventions/Progress Updates:  Patient supine in bed on entrance to room. Patient alert and agreeable to PT session. Relates need to brush teeth ans wash face/ head prior to leaving room for therapy.   Patient with no pain complaint at start of session.  Therapeutic Activity: Pt demos decreased sensation in RUE>RLE especially with LT and hot/ cold.  Bed Mobility: Pt performed supine <> sit with Mod I. No cueing required and demos good seated balance.  Transfers: Pt performed sit<>stand and stand pivot transfers throughout session with close supervision. Minimal demo of L lean but is apparent. Provided vc/ tc for minimal technique and demo'd improvements during session.   Gait Training:  Pt ambulated >200 ft x2 using no AD with close supervision. Demonstrated significant decrease in ataxia and improved balance as well as step height/ length. On return trip pt is able to maintain balance with hold of 5# weighted Tidal Tank with no deviation from path. Provided vc/ tc for positioning of Tidal Tank.  Neuromuscular Re-ed: NMR facilitated during session with focus on standing balance and dynamic gait. Pt guided in Pt guided in ambulation through agility ladder: One step into each square out and back x2 One step in each square with high knees and slower pace out and back x2 Alternating toe tap with progression of step into next square out and back  x4 Alternating step with progression into next square after controlled circling of swing LE around cone x2.   Progressed with dual task of above and hold of 3# weighted ball x4 Progressed with dual task of above with 4# weighted bar as well as 9" hurdles spaced every 3 slats x4 Minimal LOB with significant improvements after education re: balance shifting forward/ backward as well as laterally.   NMR performed for improvements in motor control and coordination, balance, sequencing, judgement, and self confidence/ efficacy in performing all aspects of mobility at highest level of independence.   Patient supine in bed at end of session with brakes locked, bed alarm set, and all needs within reach.   Therapy Documentation Precautions:  Precautions Precautions: Fall Precaution/Restrictions Comments: fall, L ataxia, R side sensory impairments (temp and pain) Restrictions Weight Bearing Restrictions Per Provider Order: No General:    Pain: Pain Assessment Pain Scale: 0-10 Pain Score: 0-No pain     Therapy/Group: Individual Therapy  Loel Dubonnet PT, DPT, CSRS 05/07/2023, 6:19 PM

## 2023-05-07 NOTE — Progress Notes (Addendum)
 Occupational Therapy Session Note  Patient Details  Name: Charles Benitez MRN: [id number] Date of Birth: Jun 13, 1975  Today's Date: 05/07/2023 OT Individual Time: 1005-1059 OT Individual Time Calculation (min): 54 min  Session 2 OT Individual Time: 1448-1530 OT Individual Time Calculation (min): 42 min  OT Missed Time: 18 min (OT running late following previous session)  Short Term Goals: Week 1:  OT Short Term Goal 1 (Week 1): Pt will demonstrate improved dynamic standing balance while completing self care task requiring SBA for safety. OT Short Term Goal 2 (Week 1): Pt will demonstrate independence with self monitoring fatigue level in order to utilize rest breaks as needed when completing daily tasks.  Skilled Therapeutic Interventions/Progress Updates:     AM Session:  Pt received sitting EOB, dressed and ready for the day upon OT arrival. Pt presenting to be in good spirits receptive to skilled OT session reporting 0/10 pain- OT offering intermittent rest breaks, repositioning, and therapeutic support to optimize participation in therapy session. Pt excited that he had a BM last night and that he slept much better last night. Focus this session on dynamic balance, activity tolerance, and proximal strength training.   BADLs:  -Engaged Pt in completing grooming/hygiene tasks in standing position at sink for increased balance and endurance challenge. Pt able to brush teeth, groom hair, apply lotion, and apply deodorant without LOB. Pt able to manipulate small toiletry items without dropping.   Mobility:  -Pt completed functional mobility <> therapy gym ~147ft each trial this session no AD with CGA to occasional min A provided during LOB when turning to change directions. Pt with improved arm swing and fluidity of steps this session, able to tolerate turning head R<>L during functional mobility without dizziness reported.   Therapeutic Activity:  -Engaged Pt in dynamic balance star  stepping activity to address reaction time, dynamic balance, and weight shifting deficits to increase overall safety during BADLs, work on stepping strategies, and decrease fall risk. Pt abe to complete 3 minutes x2 trials with CGA to light min A with occasional LOB with no more than min A required to correct. Following activity, Pt able to retrieve colored disks used during activity from the ground with CGA no LOB, min dizziness reported when bending over.  -In 4-point quadruped position, engaged Pt in series of activities to facilitate WB'ing through R UE, address proximal strength deficits, and address B UE coordination deficits. Pt tasked with completing reach taps to cones alternating R/L UE, stacking cones to opposite side 2x10 R/L, and completing cross body pulls with 2# weight  2x12. Pt able to maintain quadruped position without LOB with CGA and min verbal cues for positioning. Engaged Pt in gentle hip extensor, abdominal, and UE stretches following to increase ROM for upcoming activity, decrease pain, and decrease joint tightness with noted improvement.  -Pt transitioned to high kneeling position with CGA. Engaged Pt in dynamic balance activities with overhead reaching, crossing midline, and reaching outside BOS incorporated into task for increase balance challenge. Pt tasked with retrieving squigz from opposite side of window seal and then placing them onto window positioned anterior to Pt. Pt completed 2x10 trials with each UE with CGA provided for balance, increased balance challenge noted when kneeling on R LE.   Pt was left resting in bed with call bell in reach, bed alarm on, and all needs met.    PM Session:  Pt received sitting EOB presenting to be in good spirits receptive to skilled OT session reporting 0/10  pain- OT offering intermittent rest breaks, repositioning, and therapeutic support to optimize participation in therapy session. Focus this session dynamic balance, coordination, and  activity tolerance.   Mobility:  -Pt completed functional mobility <> therapy gym ~166ft each trial this session no AD with CGA to occasional min A provided during LOB when turning to change directions. Pt with improved arm swing and fluidity of steps this session, able to tolerate turning head R<>L during functional mobility without dizziness reported.  Therapeutic Activity:  -Dynamic balance basket ball dribbling activity to address motor planning, dual tasking, and balance deficits to increase overall activity tolerance and safety during BADLs. Pt tasked with completing functional mobility forwards, backwards, and side stepping while dribbling basketball and quickly changing directions when instructed by therapist. Pt able to complete task with CGA to light min A provided when walking backwards or side stepping to L d/t increased balance challenge. Pt with improved coordination and L foot step height during task.   NMRE:  -Pt completed NMRE dynamic balance and coordination activities to challenge higher level balance for increased safety during BADLs and IADLs. Donned 2# ankle weights onto B LE for increased proprioceptive feedback during task. Pt instructed to complete series of marches in place, forwards, and backwards while tossing 1# weighted ball form R>LE for increased dual tasking challenge. Task completed at overall CGA level without significant LOB.   Therapeutic Exercise:  -Engaged Pt in completing the following intervals on NuStep to increase overall activity tolerance and B U/LE reciprocal movements for improved safety during BADLs and IADLs:   -3 minutes level 4  -4 minutes level 5  -4 minutes level 6  Provided Pt with short rest breaks between each interval and engaged Pt in PLB'ing to decrease overall breathing rate and HR.   Pt was left resting in bed with call bell in reach, bed alarm on, and all needs met.    Therapy Documentation Precautions:  Precautions Precautions:  Fall Precaution/Restrictions Comments: fall, L ataxia, R side sensory impairments (temp and pain) Restrictions Weight Bearing Restrictions Per Provider Order: No   Therapy/Group: Individual Therapy  Clide Deutscher 05/07/2023, 10:26 AM

## 2023-05-07 NOTE — Progress Notes (Signed)
 Patient declined gabapentin, senna, and Wellbutrin saying he didn't have and pain, didn't feel anxious, and had a good bm. Patient declines waiting for staff for restroom and will inform staff that he has gone. Nurse educated patient on safety.

## 2023-05-07 NOTE — Progress Notes (Signed)
 Physical Therapy Session Note  Patient Details  Name: Charles Benitez MRN: [id number] Date of Birth: 11-23-1975  Today's Date: 05/07/2023 PT Individual Time: 0865-7846 PT Individual Time Calculation (min): 25 min   Short Term Goals: Week 1:  PT Short Term Goal 1 (Week 1): Pt will perform basic transfers with supervision PT Short Term Goal 2 (Week 1): Pt will be able to perform gait x 150' with min assist PT Short Term Goal 3 (Week 1): Pt will be able to perform a flight of stairs with min assist for bedroom access  Skilled Therapeutic Interventions/Progress Updates:    Session focused on NMR to address higher level balance, dual task activities, and overall functional mobility retraining. Pt performed transfers with supervision throughout session and independent with bed mobility. Gait in hallways to and from therapy with light CGA for balance and progressing to supervision by end of session. Significant improvement with LLE clearance, coordination and stance compared to this PT's eval with pt this weekend. Pt able to self correct for mild LOB. Floor transfer training reviewed for safety and what to do in case of fall or medical event. Performed floor transfer with supervision and demonstrates smooth transitional movements between supine, prone, quadruped -> tall kneeling and standing. Stair negotiation for further balance and strengthening as well as home access x 16 steps without device/handrails with CGA progressing to close supervision alternating stepping pattern self selected. End of session transferred to the toilet with supervision and NT aware.  Therapy Documentation Precautions:  Precautions Precautions: Fall Precaution/Restrictions Comments: fall, L ataxia, R side sensory impairments (temp and pain) Restrictions Weight Bearing Restrictions Per Provider Order: No    Pain: Pain Assessment Pain Scale: 0-10 Pain Score: 0-No pain    Therapy/Group: Individual Therapy  Karolee Stamps Darrol Poke, PT, DPT, CBIS  05/07/2023, 11:43 AM

## 2023-05-08 MED ORDER — SENNA 8.6 MG PO TABS
2.0000 | ORAL_TABLET | Freq: Two times a day (BID) | ORAL | Status: DC
  Administered 2023-05-08 – 2023-05-10 (×4): 17.2 mg via ORAL
  Filled 2023-05-08 (×4): qty 2

## 2023-05-08 MED ORDER — SENNOSIDES-DOCUSATE SODIUM 8.6-50 MG PO TABS: 2.0000 | ORAL_TABLET | Freq: Two times a day (BID) | ORAL | Status: DC

## 2023-05-08 NOTE — Progress Notes (Signed)
 This LPN called the on call, Delle Reining.  Pt c/o constipation and asking for mag citrate.  The on call told this LPN to up pt's dose of senokot to 2 pills 2x a day.  Also give PRN dose of Miralax.

## 2023-05-08 NOTE — Progress Notes (Signed)
 PROGRESS NOTE   Subjective/Complaints:  No issues overnite , doing very well with PT this am , fair sleep , felt a little dizzy this am   ROS: +anxiety,  As per HPI. Denies CP, SOB, abd pain, N/V/D/C, or any other complaints at this time.     Objective:   No results found. Recent Labs    05/06/23 0517  WBC 7.9  HGB 16.6  HCT 49.7  PLT 310   Recent Labs    05/06/23 0517  NA 140  K 4.5  CL 105  CO2 27  GLUCOSE 109*  BUN 13  CREATININE 0.74  CALCIUM 8.9    Intake/Output Summary (Last 24 hours) at 05/08/2023 0935 Last data filed at 05/08/2023 0800 Gross per 24 hour  Intake 820 ml  Output 600 ml  Net 220 ml        Physical Exam: Vital Signs Blood pressure 123/85, pulse 61, temperature 97.7 F (36.5 C), resp. rate 16, height 5\' 7"  (1.702 m), weight 80.6 kg, SpO2 98%.  Gen: no distress, normal appearing, sitting up at EOB  General: No acute distress Mood and affect are appropriate Heart: Regular rate and rhythm no rubs murmurs or extra sounds Lungs: Clear to auscultation, breathing unlabored, no rales or wheezes Abdomen: Positive bowel sounds, soft nontender to palpation, nondistended Extremities: No clubbing, cyanosis, or edema Skin: No evidence of breakdown, no evidence of rash   Neurologic exam:  Cognition: AAO to person, place, time and event.  Language: Fluent, No substitutions or neoglisms. No dysarthria.Insight: Good  insight into current condition.  Mood: Pleasant affect, appropriate mood.  Sensation: Right hemisensory loss to  pain, and temperature RUE>RLE, LT intact  Romberg neg Tandem gait mildly affected   CN: 2,3,4,5 , 7 intact  Coordination: No apparent tremors. No ataxia on FTN or HTS Spasticity: MAS 0 in all extremities.       Strength:                RUE: 5/5 SA, 5/5 EF, 5/5 EE, 5/5 WE, 5/5 FF, 5/5 FA                LUE:  5-/5 SA, 5/5 EF, 5/5 EE, 5/5 WE, 5/5 FF, 5-/5 FA                 RLE: 5/5 HF, 5/5 KE, 5/5  DF, 5/5  EHL, 5/5  PF                 LLE:  4/5 HF, 5/5 KE, 5/5  DF, 5/5  EHL, 5/5  PF    Assessment/Plan: 1. Functional deficits which require 3+ hours per day of interdisciplinary therapy in a comprehensive inpatient rehab setting. Physiatrist is providing close team supervision and 24 hour management of active medical problems listed below. Physiatrist and rehab team continue to assess barriers to discharge/monitor patient progress toward functional and medical goals  Care Tool:  Bathing    Body parts bathed by patient: Right arm, Face, Left arm, Chest, Abdomen, Front perineal area, Buttocks, Right upper leg, Left upper leg, Right lower leg, Left lower leg         Bathing  assist Assist Level: Supervision/Verbal cueing     Upper Body Dressing/Undressing Upper body dressing   What is the patient wearing?: Pull over shirt    Upper body assist Assist Level: Supervision/Verbal cueing    Lower Body Dressing/Undressing Lower body dressing      What is the patient wearing?: Underwear/pull up, Pants     Lower body assist Assist for lower body dressing: Contact Guard/Touching assist     Toileting Toileting    Toileting assist Assist for toileting: Contact Guard/Touching assist     Transfers Chair/bed transfer  Transfers assist     Chair/bed transfer assist level: Supervision/Verbal cueing     Locomotion Ambulation   Ambulation assist      Assist level: Contact Guard/Touching assist Assistive device: No Device Max distance: 150'   Walk 10 feet activity   Assist     Assist level: Supervision/Verbal cueing Assistive device: No Device   Walk 50 feet activity   Assist    Assist level: Contact Guard/Touching assist Assistive device: No Device    Walk 150 feet activity   Assist Walk 150 feet activity did not occur: Safety/medical concerns  Assist level: Contact Guard/Touching assist Assistive device: No Device     Walk 10 feet on uneven surface  activity   Assist     Assist level: Moderate Assistance - Patient - 50 - 74% Assistive device: Hand held assist   Wheelchair     Assist Is the patient using a wheelchair?: Yes (will be primary ambulator) Type of Wheelchair: Manual    Wheelchair assist level: Dependent - Patient 0%      Wheelchair 50 feet with 2 turns activity    Assist        Assist Level: Dependent - Patient 0%   Wheelchair 150 feet activity     Assist      Assist Level: Dependent - Patient 0%   Blood pressure 123/85, pulse 61, temperature 97.7 F (36.5 C), resp. rate 16, height 5\' 7"  (1.702 m), weight 80.6 kg, SpO2 98%.  Medical Problem List and Plan: 1. 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 Formulized individual treatment plan based on medical history, underlying problem and comorbidities.  2.  Antithrombotics: -DVT/anticoagulation:  Pharmaceutical: Lovenox 40mg  daily -antiplatelet therapy: Aspirin and Plavix for three weeks followed by aspirin alone   3. Pain Management: Tylenol, Robaxin as needed             -continue gabapentin 100 mg q HS    4. Mood/Behavior/Sleep/Anxiety: LCSW to evaluate and provide emotional support             -antipsychotic agents: n/a -05/04/23 Buspar 5mg  TID PRN added- taking only daily increase to BID and add lexapro 5mg  every day Feeling less stressed today     5. Neuropsych/cognition: This patient is capable of making decisions on his own behalf.   6. Skin/Wound Care: Routine skin care checks   7. Fluids/Electrolytes/Nutrition: Routine Is and Os and follow-up chemistries   8: Medullary stroke: meclizine 25mg  BID  as needed for vertigo   9.  Constipation.  No bowel movement since admission.   -Dose of mag citrate on admission -Continue twice daily Senokot-S. -05/05/23 LBM today finally, continue senokot for now but hopefully can d/c-- might be better to try softeners instead.   10. Active smoker: discussed trying nicotine lozenges when he has cravings  11. Bradycardia: medications reviewed and is not on any rate lowering agents  12. L eyelid discomfort: Resolved     LOS: 5 days A FACE TO FACE EVALUATION WAS PERFORMED  Erick Colace 05/08/2023, 9:35 AM

## 2023-05-08 NOTE — Progress Notes (Signed)
 Physical Therapy Session Note  Patient Details  Name: Charles Benitez MRN: [id number] Date of Birth: September 13, 1975  Today's Date: 05/08/2023 PT Individual Time: 0900-1000 PT Individual Time Calculation (min): 60 min   Short Term Goals: Week 1:  PT Short Term Goal 1 (Week 1): Pt will perform basic transfers with supervision PT Short Term Goal 2 (Week 1): Pt will be able to perform gait x 150' with min assist PT Short Term Goal 3 (Week 1): Pt will be able to perform a flight of stairs with min assist for bedroom access  Skilled Therapeutic Interventions/Progress Updates:  Patient supine in bed on entrance to room. Patient alert and agreeable to PT session.   Patient with no pain complaint at start of session.  Therapeutic Activity: Bed Mobility: Pt performed supine <> sit with IND. Good seated balance noted.  Transfers: Pt performed sit<>stand and stand pivot transfers throughout session with supervision/ IND. With instruction/ education is able to demonstrate sit<>stand to/ from wheeled stool in therapy gym with minimal movement to stool showing improved motor control.   Gait Training:  Pt ambulated throughout session with no AD and supervision/ IND. Demonstrated mild deviations during FGA with more NBOS activities. Improves overall with practice and focus. Provided education and min cues throughout for quality gait improvements during NBOS and slower gait speed.   Neuromuscular Re-ed: NMR facilitated during session with focus on standing balance and dynamic gait. Pt guided in completion of Berg Balance test and Functional Gait Assessment.  Patient demonstrates reduced fall risk as noted by score of   50/56 on Berg Balance Scale.  (<36= high risk for falls, close to 100%; 37-45 significant >80%; 46-51 moderate >50%; 52-55 lower >25%) Demonstrates mild difficulty with more NBOS activities. Educated in use of corner in home to provide support as needed to continue to practice single-leg balance  for time and stability as well as to perform squats in midline and with R/ L sided bias with light touch of bottom to chair.  Patient demonstrates low fall risk as noted by score of 26/30 on  Functional Gait Assessment.   <22/30 = predictive of falls, <20/30 = fall in 6 months, <18/30 = predictive of falls in PD MCID: 5 points stroke population, 4 points geriatric population (ANPTA Core Set of Outcome Measures for Adults with Neurologic Conditions, 2018)   NMR performed for improvements in motor control and coordination, balance, sequencing, judgement, and self confidence/ efficacy in performing all aspects of mobility at highest level of independence.   Patient supine in bed at end of session with brakes locked, bed alarm set, and all needs within reach.   Therapy Documentation Precautions:  Precautions Precautions: Fall Precaution/Restrictions Comments: fall, L ataxia, R side sensory impairments (temp and pain) Restrictions Weight Bearing Restrictions Per Provider Order: No  Pain:  Mild back pain related d/t inactivity throughout day and spending time in bed. Addressed with activity in therapy session.   Balance: Balance Balance Assessed: Yes Standardized Balance Assessment Standardized Balance Assessment: Berg Balance Test;Functional Gait Assessment Berg Balance Test Sit to Stand: Able to stand without using hands and stabilize independently Standing Unsupported: Able to stand safely 2 minutes Sitting with Back Unsupported but Feet Supported on Floor or Stool: Able to sit safely and securely 2 minutes Stand to Sit: Sits safely with minimal use of hands Transfers: Able to transfer safely, minor use of hands Standing Unsupported with Eyes Closed: Able to stand 10 seconds safely Standing Ubsupported with Feet Together: Able to place  feet together independently and stand for 1 minute with supervision From Standing, Reach Forward with Outstretched Arm: Can reach confidently >25 cm  (10") From Standing Position, Pick up Object from Floor: Able to pick up shoe safely and easily From Standing Position, Turn to Look Behind Over each Shoulder: Looks behind one side only/other side shows less weight shift Turn 360 Degrees: Able to turn 360 degrees safely but slowly Standing Unsupported, Alternately Place Feet on Step/Stool: Able to stand independently and safely and complete 8 steps in 20 seconds Standing Unsupported, One Foot in Front: Able to plae foot ahead of the other independently and hold 30 seconds Standing on One Leg: Able to lift leg independently and hold 5-10 seconds Total Score: 50 Static Sitting Balance Static Sitting - Balance Support: Feet supported Static Sitting - Level of Assistance: 7: Independent Dynamic Sitting Balance Dynamic Sitting - Balance Support: Feet supported;During functional activity Dynamic Sitting - Level of Assistance: 7: Independent Static Standing Balance Static Standing - Balance Support: During functional activity;No upper extremity supported (mild difficulty with very NBOS) Static Standing - Level of Assistance: 7: Independent Dynamic Standing Balance Dynamic Standing - Balance Support: During functional activity Dynamic Standing - Level of Assistance: 7: Independent Functional Gait  Assessment Gait assessed : Yes Gait Level Surface: Walks 20 ft in less than 5.5 sec, no assistive devices, good speed, no evidence for imbalance, normal gait pattern, deviates no more than 6 in outside of the 12 in walkway width. Change in Gait Speed: Able to smoothly change walking speed without loss of balance or gait deviation. Deviate no more than 6 in outside of the 12 in walkway width. Gait with Horizontal Head Turns: Performs head turns smoothly with no change in gait. Deviates no more than 6 in outside 12 in walkway width Gait with Vertical Head Turns: Performs task with slight change in gait velocity (eg, minor disruption to smooth gait path),  deviates 6 - 10 in outside 12 in walkway width or uses assistive device Gait and Pivot Turn: Pivot turns safely in greater than 3 sec and stops with no loss of balance, or pivot turns safely within 3 sec and stops with mild imbalance, requires small steps to catch balance. Step Over Obstacle: Is able to step over one shoe box (4.5 in total height) without changing gait speed. No evidence of imbalance. Gait with Narrow Base of Support: Ambulates 7-9 steps. Gait with Eyes Closed: Walks 20 ft, no assistive devices, good speed, no evidence of imbalance, normal gait pattern, deviates no more than 6 in outside 12 in walkway width. Ambulates 20 ft in less than 7 sec. Ambulating Backwards: Walks 20 ft, no assistive devices, good speed, no evidence for imbalance, normal gait Steps: Alternating feet, no rail. Total Score: 26 FGA comment:: Low fall risk  Therapy/Group: Individual Therapy  Loel Dubonnet PT, DPT, CSRS 05/08/2023, 5:01 PM

## 2023-05-08 NOTE — Progress Notes (Addendum)
 Patient ID: Charles Benitez, male   DOB: September 19, 1975, 48 y.o.   MRN: [id number]  Met with pt to update him on the team conference goals of mod/I level and discharge date 3/7. Have scheduled his mom to come in tomorrow at 1:00 for family training. Have let team know. Will make referral for Match and [facility] due to uninsured. No equipment needs.  2;56 PM Have placed match in chart for prescription assistance.

## 2023-05-08 NOTE — Patient Care Conference (Signed)
 Inpatient RehabilitationTeam Conference and Plan of Care Update Date: 05/08/2023   Time: 10:36 AM    Patient Name: Charles Benitez      Medical Record Number: [id number]  Date of Birth: 1976-01-07 Sex: Male         Room/Bed: 4W06C/4W06C-01 Payor Info: Payor: /    Admit Date/Time:  05/03/2023  2:43 PM  Primary Diagnosis:  CVA (cerebral vascular accident) [facility])  Hospital Problems: Principal Problem:   CVA (cerebral vascular accident) [facility])    Expected Discharge Date: Expected Discharge Date: 05/10/23  Team Members Present: Physician leading conference: Dr. Claudette Laws Social Worker Present: Dossie Der, LCSW Nurse Present: Chana Bode, RN PT Present: Ralph Leyden, PT OT Present: Mariann Barter, OT SLP Present: Everardo Pacific, SLP PPS Coordinator present : Fae Pippin, SLP     Current Status/Progress Goal Weekly Team Focus  Bowel/Bladder   Pt is continent of bowel/bladder   Pt will remain continent of bowel/bladder   Will assess qshift and PRN    Swallow/Nutrition/ Hydration               ADL's   UB bathing/dressing mod I, LB bathing/dressing close supervision,  toileting close supervision, Ambulatory toilet/shower transfers close supervision   mod I BADLs and IADLs   NMRE R hemibody, activity tolerance, dynmaic balance, safety awareness, BADL/IADL retraining, dynmaic standing balance, coordination // Barriers: looses balance when distracted or when moving quickly, decreased sensation in R hemibody, coordination    Mobility   Bed mobility = Mod I/ supervision; Transfers = supervision; Ambulation = close supervision for community distances with no AD   overall supervision/ Mod I and will be upgraded.  Barriers: improved but continued L lean/ listing, static and dynamic standing balance difficulties, decreased sensation in R hemibody /// Work on: standing balance, coordination, safety awareness, family education    Communication                 Safety/Cognition/ Behavioral Observations               Pain   Pt c/o of pain 3/10   Pt's pain is controlled   Will assess qshift and PRN    Skin   Pt's skin is intact   Pt's skin will remain intact  Maintain skin integrity      Discharge Planning:  HOme with mom and 48 yo, doing well and progressing in therapies. 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 Having trouble with high level balance issues and occasional loss of balance and ataxia.    Patient on target to meet rehab goals: yes, patient is mod I for upper body care and close supervision for lower body care and toileting. Needs mod I - supervision for transfers and able to ambulate community level distance with out an assistive device with close supervision.    *See Care Plan and progress notes for long and short-term goals.   Revisions to Treatment Plan:  MD added medications for anxiety   Teaching Needs: Safety, medications, dietary modifications, smoking cessation tips, transfers, toileting, etc.   Current Barriers to Discharge: Decreased caregiver support and Home enviroment access/layout  Possible Resolutions to Barriers: Family education 05/09/23 MATCH medication discount card     Medical Summary Current Status: Right hemibody sensory deficits, BP frair control, anxiety improving  Barriers to Discharge: Other (comments);Behavior/Mood  Barriers to Discharge Comments: anxiety and tobacco abuse  Possible Resolutions to [employer]: work on balance higher level, compensate for RIght hemibody sensory deficits   Continued Need for Acute Rehabilitation Level of Care: The patient requires daily medical management by a physician with specialized training in physical medicine and rehabilitation for the following reasons: Direction of a multidisciplinary physical rehabilitation  program to maximize functional independence : Yes Medical management of patient stability for increased activity during participation in an intensive rehabilitation regime.: Yes Analysis of laboratory values and/or radiology reports with any subsequent need for medication adjustment and/or medical intervention. : Yes   I attest that I was present, lead the team conference, and concur with the assessment and plan of the team.   Chana Bode B 05/08/2023, 2:32 PM

## 2023-05-08 NOTE — Progress Notes (Signed)
 Occupational Therapy Session Note  Patient Details  Name: Terelle Dobler MRN: [id number] Date of Birth: [birth year]  Today's Date: 05/08/2023 OT Individual Time: 4098-1191 OT Individual Time Calculation (min): 45 min   OT Individual Time: 1404-1530 OT Individual Time Calculation (min): 86 min   Short Term Goals: Week 1:  OT Short Term Goal 1 (Week 1): Pt will demonstrate improved dynamic standing balance while completing self care task requiring SBA for safety. OT Short Term Goal 2 (Week 1): Pt will demonstrate independence with self monitoring fatigue level in order to utilize rest breaks as needed when completing daily tasks.  Skilled Therapeutic Interventions/Progress Updates:     AM Session: Pt received deeply sleeping in bed waking upon OT arrival. Pt presenting to be in good spirits receptive to skilled OT session reporting 0/10 pain- OT offering intermittent rest breaks, repositioning, and therapeutic support to optimize participation in therapy session. Focus this session BADL retraining to increase overall independence and safety in BADLs at d/c. RN in/out for morning rounding. Pt completed ambulatory transfers and functional mobility from bedroom <> bathroom without AD close supervision without LOB noted. Pt able to retrieve clean clothes from closet and transport them to bathroom and set-up bathroom for shower with close supervision. Pt completed U/LB bathing in standing position to simulate completing shower in home environment with distant supervision with intermittent use of grab bars for balance. Provided education on use of shower chair to increase safety and conserve energy with Pt receptive to education, however declining to use one at home as Pt feels safe to complete shower in standing position. (Pt did report he has a family member with an extra shower chair that he can use at d/c if he feels fatigued or unsafe completing shower standing up at d/c). Following shower, Pt  completed U/LB dressing in standing position intermittently leaning against the wall for increased stability mod I- Pt demonstrating appropriate pacing, safety awareness, and insight into his deficits this session. Engaged Pt in completing grooming/hygiene tasks in standing position at sink without AD for increased balance and endurance challenge with Pt able to groom hair, brush teeth, and apply lotion mod I engaging B UEs into functional tasks without LOB. Engaged Pt in completing functional mobility to tub room for tub/shower transfer training. Pt able to safely demonstrate stepping over edge of tub, sitting in tub, and stepping back out of the tub mod I with appropriate problem solving and safety awareness noted. Pt ambulated back to room with close supervision no AD without LOB. Pt was left resting in bed with call bell in reach, bed alarm on, and all needs met.    PM Session:  Pt received sitting up in bed dressed and ready for the day upon OT arrival. Pt presenting to be in good spirits receptive to skilled OT session reporting 0/10 pain- OT offering intermittent rest breaks, repositioning, and therapeutic support to optimize participation in therapy session. Focus this session IADL retraining, functional mobility training, coping strategy education, and reassessment of Pt's FMC, GMC, and strength.   Engaged Pt in completing functional mobility to /from outdoor location >564ft without AD for endurance training with Pt able to complete with mod I. While outdoors, engaged Pt in completing functional mobility across uneven terrain, up/down inclines, up/down uneven stairs, and over sidewalks with Pt able to complete all activities with mod I demonstrating improved safety awareness, activity tolerance, and stepping strategies. Pt confiding in OT during session reporting psychosocial impact of having a CVA and lifestyle  changes has been educated to implement to decrease risk for another CVA including smoking  and drinking cessation, improved diet, and increasing daily exercise. OT engaged Pt in problem solving based discussion with education provided on techniques for adhering to goals and lifestyle changes such as having an accountability partner, setting small obtainable goals to work towards, writing down goals, and continuing to participate in occupations that are meaningful to Pt such as being with family, spending time outside, and being active. Provided education on CVA identification using BE-FAST acronym and CVA prevention with Pt receptive to education and presenting to be motivated to make lifestyle changes.   In ADL apartment, engaged Pt in completing simulated cooking, cleaning, and home management activities to increase overall safety and independence at home. Pt abe to safety complete all tasks without AD, no LOB and independently request rest breaks as need. Pt presenting with appropriate problem solving skills, safety awareness, attention, and insight into his deficits during activities.   Pt completed lateral pinch assessment seated in chair to assess pinch strength. During initial evaluation, Pt scored 21. 5# on both R/L hand. During reassessment this session Pt scored 31# on the R and 26# on the L hand. PT with significantly improved strength in lateral pinch bilaterally with improved functional use during BADLs.   Pt completed The Dynamometer Grip Strength Test as a quantitative and objective measure of isometric muscular strength of the hand and forearm. The patient was asked to sit with their back, pelvis, and knees at 90 degrees. The shoulder was adducted and neutrally rotated with The elbow flexed to 90 degrees and forearm in neutral. The arm was not supported. The score was determined by calculating the average of 3 trials. The pt's average score was 115 lbs in the R hand and 110 lbs in the L hand. Pt without significant change noted in UE strength as Pt scored 110# on R and 120# on L  during initial evaluation.   Pt completed 9 Hole Peg Test for [facility], speed, and dexterity. During initial evaluation, Pt scored 19.5 sec on Right and 21.3 sec on Left. During reassessment this session Pt scored Right: 17.46 and Left:  21.6. Pt without significant change in [facility] scores, however mod improvement in R hand functional use compared to initial evaluation.   Pt compelted box and block assessment for unilateral gross dexterity while seated at tabletop. During initial evaluation, Pt scored  77 on L and 71 on R. During reassessment this session, Pt scored 79 on L and 89 R with significantly increased gross motor dexterity on R hand noted.   Pt completed functional mobility back to his room no AD mod I. Pt was left resting in bed with call bell in reach and all needs met.    Therapy Documentation Precautions:  Precautions Precautions: Fall Precaution/Restrictions Comments: fall, L ataxia, R side sensory impairments (temp and pain) Restrictions Weight Bearing Restrictions Per Provider Order: No   Therapy/Group: Individual Therapy  Clide Deutscher 05/08/2023, 7:43 AM

## 2023-05-08 NOTE — Progress Notes (Signed)
 Physical Therapy Session Note  Patient Details  Name: Charles Benitez MRN: [id number] Date of Birth: 1975/05/02  Today's Date: 05/08/2023 PT Individual Time: 1302-1334 PT Individual Time Calculation (min): 32 min   Short Term Goals: {[id number]}  Skilled Therapeutic Interventions/Progress Updates:      Therapy Documentation Precautions:  Precautions Precautions: Fall Precaution/Restrictions Comments: fall, L ataxia, R side sensory impairments (temp and pain) Restrictions Weight Bearing Restrictions Per Provider Order: No General:   Vital Signs: Therapy Vitals Temp: 98.5 F (36.9 C) Pulse Rate: 80 Resp: 19 BP: 136/88 Patient Position (if appropriate): Sitting Oxygen Therapy SpO2: 98 % O2 Device: Room Air Pain:   Mobility:   Locomotion :    Trunk/Postural Assessment :    Balance:   Exercises:   Other Treatments:      Therapy/Group: {Therapy/Group:[id number]}  Loel Dubonnet 05/08/2023, 5:02 PM

## 2023-05-09 ENCOUNTER — Other Ambulatory Visit (HOSPITAL_COMMUNITY): Payer: Self-pay

## 2023-05-09 MED ORDER — ESCITALOPRAM OXALATE 5 MG PO TABS
5.0000 mg | ORAL_TABLET | Freq: Every day | ORAL | 0 refills | Status: DC
  Filled 2023-05-09: qty 30, 30d supply, fill #0

## 2023-05-09 MED ORDER — MAGNESIUM CITRATE PO SOLN
1.0000 | Freq: Once | ORAL | Status: AC
  Administered 2023-05-09: 1 via ORAL
  Filled 2023-05-09: qty 296

## 2023-05-09 MED ORDER — ASPIRIN 81 MG PO TBEC
81.0000 mg | DELAYED_RELEASE_TABLET | Freq: Every day | ORAL | 0 refills | Status: AC
  Filled 2023-05-09: qty 100, 100d supply, fill #0

## 2023-05-09 MED ORDER — BUSPIRONE HCL 5 MG PO TABS
5.0000 mg | ORAL_TABLET | Freq: Two times a day (BID) | ORAL | 0 refills | Status: DC
  Filled 2023-05-09: qty 60, 30d supply, fill #0

## 2023-05-09 MED ORDER — CLOPIDOGREL BISULFATE 75 MG PO TABS
75.0000 mg | ORAL_TABLET | Freq: Every day | ORAL | 0 refills | Status: AC
  Filled 2023-05-09: qty 13, 13d supply, fill #0

## 2023-05-09 MED ORDER — ACETAMINOPHEN 325 MG PO TABS: 325.0000 mg | ORAL_TABLET | ORAL | Status: AC | PRN

## 2023-05-09 MED ORDER — SENNA 8.6 MG PO TABS
2.0000 | ORAL_TABLET | Freq: Every evening | ORAL | 0 refills | Status: DC | PRN
  Filled 2023-05-09: qty 60, 30d supply, fill #0

## 2023-05-09 MED ORDER — GABAPENTIN 100 MG PO CAPS
100.0000 mg | ORAL_CAPSULE | Freq: Every day | ORAL | 0 refills | Status: DC
  Filled 2023-05-09: qty 30, 30d supply, fill #0

## 2023-05-09 NOTE — Progress Notes (Addendum)
 Occupational Therapy Discharge Summary  Patient Details  Name: Charles Benitez MRN: [id number] Date of Birth: April 23, 1975  Date of Discharge from OT service:May 09, 2023  Today's Date: 05/09/2023 OT Individual Time: 4098-1191 OT Individual Time Calculation (min): 45 min  OT Individual Time: 1300-1344 OT Individual Time Calculation (min): 44 min   Patient has met 7 of 7 long term goals due to improved activity tolerance, improved balance, postural control, ability to compensate for deficits, functional use of  RIGHT upper and RIGHT lower extremity, improved attention, improved awareness, and improved coordination.  Patient to discharge at overall Modified Independent level.  Patient's care partner is independent to provide the necessary physical assistance at discharge.    Reasons goals not met: All goals met   Recommendation:  Patient will benefit from ongoing skilled OT services in outpatient setting to continue to advance functional skills in the area of BADL, iADL, Vocation, and Reduce care partner burden.  Equipment: No equipment provided  Reasons for discharge: treatment goals met and discharge from hospital  Patient/family agrees with progress made and goals achieved: Yes  OT Discharge Skilled Therapeutic Interventions/Progress Updates:  AM Session:  Pt received sitting EOB presenting to be in good spirits receptive to skilled OT session reporting 0/10 pain- OT offering intermittent rest breaks, repositioning, and therapeutic support to optimize participation in therapy session. Focus this session HEP education. PT completed functional mobility to therapy gym mod I no AD. Pt completed the following UB exercises using green (moderate resistance) therband; all other exercises completed without resistance. OT provided demonstration of each exercise to support learning and verbal/tactile cues for technique and muscle engagement:  - Seated Shoulder Horizontal Abduction with Resistance   - 1 x daily - 7 x weekly - 2 sets - 10 reps - Seated Shoulder Diagonal Pulls with Resistance  - 1 x daily - 7 x weekly - 2 sets - 10 reps - Seated Shoulder Flexion with Self-Anchored Resistance  - 1 x daily - 7 x weekly - 2 sets - 10 reps - Seated Elbow Extension with Self-Anchored Resistance  - 1 x daily - 7 x weekly - 2 sets - 10 reps - Seated Bilateral Shoulder External Rotation with Resistance  - 1 x daily - 7 x weekly - 2 sets - 10 reps - Seated Elbow Flexion with Self-Anchored Resistance  - 1 x daily - 7 x weekly - 2 sets - 10 reps - Supine Bridge  - 1 x daily - 7 x weekly - 2 sets - 10 reps - Marching Bridge  - 1 x daily - 7 x weekly - 2 sets - 10 reps - Bird Dog  - 1 x daily - 7 x weekly - 2 sets - 10 reps - Cat Cow  - 1 x daily - 7 x weekly - 2 sets - 10 reps - Plank with Hip Extension  - 1 x daily - 7 x weekly - 2 sets - 10 reps - Plank with Hip Abduction  - 1 x daily - 7 x weekly - 2 sets - 10 reps Pt tolerated exercises well without pain reported and intermittent rest breaks provided for energy conservation. Pt ambulated back to his room mod I no AD. Pt was left resting in bed with call bell in reach and all needs met.    PM Session:  Pt received in his room with his mother present for family education focused session. Pt presenting to be in good spirits receptive to skilled OT session  reporting 0/10 pain- OT offering intermittent rest breaks, repositioning, and therapeutic support to optimize participation in therapy session. Provided education on CVA etiology/recovery process and signs/symptoms of CVA using BE-FAST acronym. Educated on simple lifestyle changes to implement to prevent CVA such as drinking/smoking cessation, diet modifications, and increasing daily exercise. Education provided on Pt's current functional status and deficits 2/2 to CVA with emphasis on balance during higher level IADLs or functional tasks, sensation, attention/memory, activity tolerance, and coordination.  Educated Pt and family that Pt is safe to complete BADLs independently at home, however supervision is recommended for higher level IADL tasks to ensure safety. Provided education on fall prevention, fall recovery, and simple home modifications to increase safety. Pt and Pt's family receptive to all education provided reporting all questions were answered at end of session. Pt was left resting in bed with call bell in reach and all needs met.   Precautions/Restrictions  Precautions Precautions: Fall Precaution/Restrictions Comments: fall,  R side sensory impairments (temp and pain) Restrictions Weight Bearing Restrictions Per Provider Order: No Pain Pain Assessment Pain Scale: 0-10 Pain Score: 0-No pain ADL ADL Eating: Independent Where Assessed-Eating: Chair Grooming: Independent Where Assessed-Grooming: Edge of bed Upper Body Bathing: Independent Where Assessed-Upper Body Bathing: Shower Lower Body Bathing: Independent Where Assessed-Lower Body Bathing: Shower Upper Body Dressing: Independent Where Assessed-Upper Body Dressing: Chair Lower Body Dressing: Independent Where Assessed-Lower Body Dressing: Chair Toileting: Independent Where Assessed-Toileting: Teacher, adult education: Community education officer Method: Insurance claims handler: Modified independent Web designer Method: Event organiser: Psychologist, counselling Method: Designer, industrial/product: Grab bars, Emergency planning/management officer Vision Baseline Vision/History: 0 No visual deficits Patient Visual Report: No change from baseline Vision Assessment?: No apparent visual deficits Eye Alignment: Within Functional Limits Ocular Range of Motion: Restricted on the left;Restricted looking up;Impaired-to be further tested in functional context Alignment/Gaze Preference: Within Defined Limits Tracking/Visual Pursuits: Decreased smoothness of eye movement to LEFT inferior  field;Decreased smoothness of eye movement to LEFT superior field Perception  Perception: Within Functional Limits Praxis Praxis: WFL Cognition Cognition Overall Cognitive Status: Within Functional Limits for tasks assessed Arousal/Alertness: Awake/alert Orientation Level: Person;Place;Situation Person: Oriented Place: Oriented Situation: Oriented Memory: Impaired Memory Impairment: Decreased recall of new information;Decreased short term memory Attention: Selective;Sustained Sustained Attention: Appears intact Selective Attention: Impaired Selective Attention Impairment: Functional complex Awareness: Appears intact Problem Solving: Appears intact Executive Function: Organizing;Initiating;Self Monitoring;Self Correcting;Sequencing Sequencing: Appears intact Organizing: Appears intact Initiating: Appears intact Self Monitoring: Appears intact Self Correcting: Appears intact Safety/Judgment: Appears intact Comments: baseline attention deficits Brief Interview for Mental Status (BIMS) Repetition of Three Words (First Attempt): 3 Temporal Orientation: Year: Correct Temporal Orientation: Month: Accurate within 5 days Temporal Orientation: Day: Correct Recall: "Sock": Yes, no cue required Recall: "Blue": Yes, no cue required Recall: "Bed": Yes, no cue required BIMS Summary Score: 15 Sensation Sensation Light Touch: Impaired Detail Central sensation comments: reports numbness in right arm, right side and right leg. Unable to feel needle pricks. Light Touch Impaired Details: Impaired RUE;Impaired RLE Hot/Cold: Impaired Detail Hot/Cold Impaired Details: Impaired RUE;Impaired RLE Proprioception: Impaired by gross assessment (presents to be delayed) Stereognosis: Impaired Detail Stereognosis Impaired Details: Impaired RUE Coordination Gross Motor Movements are Fluid and Coordinated: No Fine Motor Movements are Fluid and Coordinated: No Coordination and Movement Description:  LLE mild ataxia with higher level mobility - improved from eval Heel Shin Test: [facility] 9 Hole Peg Test: Right: 17.46 and Left:  21.6. Motor  Motor Motor: Ataxia;Motor impersistence Motor - Discharge Observations:  R sensory impairments, L sided ataxia - ataxia significantly improved from eval Mobility  Bed Mobility Bed Mobility: Sit to Supine;Supine to Sit Rolling Right: Independent Rolling Left: Independent Supine to Sit: Independent Sit to Supine: Independent Transfers Sit to Stand: Independent Stand to Sit: Independent  Trunk/Postural Assessment  Cervical Assessment Cervical Assessment: Within Functional Limits Thoracic Assessment Thoracic Assessment: Within Functional Limits Lumbar Assessment Lumbar Assessment: Within Functional Limits Postural Control Postural Control: Deficits on evaluation Protective Responses: minor delay - significantly imporved from eval  Balance Balance Balance Assessed: Yes Static Sitting Balance Static Sitting - Balance Support: Feet supported Static Sitting - Level of Assistance: 7: Independent Dynamic Sitting Balance Dynamic Sitting - Balance Support: Feet supported;During functional activity Dynamic Sitting - Level of Assistance: 7: Independent Static Standing Balance Static Standing - Balance Support: During functional activity;No upper extremity supported Static Standing - Level of Assistance: 7: Independent Dynamic Standing Balance Dynamic Standing - Balance Support: During functional activity Dynamic Standing - Level of Assistance: 7: Independent Extremity/Trunk Assessment RUE Assessment RUE Assessment: Within Functional Limits Active Range of Motion (AROM) Comments: WFL General Strength Comments: Grip 115#, lateral pinch 21.5# LUE Assessment LUE Assessment: Within Functional Limits Active Range of Motion (AROM) Comments: WFL General Strength Comments: grip: 110# lateral pinch: 21.5#, baseline shoulder defcits normally experiencing  pain with shoulder ranges. strength is 5/5 throughout UE though.   Tollie Pizza Woodson 05/09/2023, 12:09 PM

## 2023-05-09 NOTE — Progress Notes (Signed)
 Physical Therapy Session Note  Patient Details  Name: Charles Benitez MRN: [id number] Date of Birth: 18-May-1975  Today's Date: 05/09/2023 PT Individual Time: 4098-1191 PT Individual Time Calculation (min): 70 min   Short Term Goals: Week 1:  PT Short Term Goal 1 (Week 1): Pt will perform basic transfers with supervision PT Short Term Goal 2 (Week 1): Pt will be able to perform gait x 150' with min assist PT Short Term Goal 3 (Week 1): Pt will be able to perform a flight of stairs with min assist for bedroom access  Skilled Therapeutic Interventions/Progress Updates:    Reviewed plan for fam ed this PM as well as OPPT recommendations per pt questions. Indpendent with mobility on unit without AD including all transfers. Focused on NMR to address high level balance, coordination and strengthening.  Agility ladder including forward/backward and sidestepping, high knee side stepping, and retro each direction, leading left and then leading R x 2 trials (one without ankle weights and then x 1 with 5# ankle weights) Simulated functional task to carry a box with items inside to various locations to simulate lifting boxes at work or home and then progressed to adding 10 lb weight to various heights located around the room with cues for body mechanics. Pt reporting this was much harder than he anticipated and that he would not be able to go to work. Biodex with compliant level 8 and 6 without UE support for limits of stability and postural control/random training to work on balance reactions and coordination - demo's decrease control with L sided weightshifts.  Pt performed toileting x 2 during session independent.  Reviewed recommendations for d/c and HEP to continue to address balance and strength impairments.  Completed floor transfer and plank x 30 sec x 10 sec to work on core strengthening and overall balance retraining.  End of session returned to room with all needs in reach.   Therapy  Documentation Precautions:  Precautions Precautions: Fall Precaution/Restrictions Comments: fall,  R side sensory impairments (temp and pain) Restrictions Weight Bearing Restrictions Per Provider Order: No  Pain: Pain Assessment Pain Scale: 0-10 Pain Score: 0-No pain     Therapy/Group: Individual Therapy  Karolee Stamps Darrol Poke, PT, DPT, CBIS  05/09/2023, 12:02 PM

## 2023-05-09 NOTE — Progress Notes (Signed)
 PROGRESS NOTE   Subjective/Complaints:  Discussed d/c date  C/o constipation , LBM 2 d ago, taking senna without relief., no issues at home, discussed fluids, diet and activity level.   ROS: +anxiety,  As per HPI. Denies CP, SOB, abd pain, N/V/D/C, or any other complaints at this time.     Objective:   No results found. No results for input(s): "WBC", "HGB", "HCT", "PLT" in the last 72 hours.  No results for input(s): "NA", "K", "CL", "CO2", "GLUCOSE", "BUN", "CREATININE", "CALCIUM" in the last 72 hours.   Intake/Output Summary (Last 24 hours) at 05/09/2023 0904 Last data filed at 05/09/2023 0809 Gross per 24 hour  Intake 1057 ml  Output 3275 ml  Net -2218 ml        Physical Exam: Vital Signs Blood pressure 117/75, pulse (!) 56, temperature 97.9 F (36.6 C), temperature source Oral, resp. rate 16, height 5\' 7"  (1.702 m), weight 80.6 kg, SpO2 97%.  Gen: no distress, normal appearing, sitting up at EOB  General: No acute distress Mood and affect are appropriate Heart: Regular rate and rhythm no rubs murmurs or extra sounds Lungs: Clear to auscultation, breathing unlabored, no rales or wheezes Abdomen: Positive bowel sounds, soft nontender to palpation, nondistended Extremities: No clubbing, cyanosis, or edema Skin: No evidence of breakdown, no evidence of rash   Neurologic exam:  Cognition: AAO to person, place, time and event.  Language: Fluent, No substitutions or neoglisms. No dysarthria.Insight: Good  insight into current condition.  Mood: Pleasant affect, appropriate mood.  Sensation: Right hemisensory loss to  pain, and temperature RUE>RLE, LT intact  Romberg neg Tandem gait mildly affected   CN: 2,3,4,5 , 7 intact  Coordination: No apparent tremors. No ataxia on FTN or HTS Spasticity: MAS 0 in all extremities.       Strength:               5/5 in BUE and BLE   Assessment/Plan: 1. Functional deficits  which require 3+ hours per day of interdisciplinary therapy in a comprehensive inpatient rehab setting. Physiatrist is providing close team supervision and 24 hour management of active medical problems listed below. Physiatrist and rehab team continue to assess barriers to discharge/monitor patient progress toward functional and medical goals  Care Tool:  Bathing    Body parts bathed by patient: Right arm, Face, Left arm, Chest, Abdomen, Front perineal area, Buttocks, Right upper leg, Left upper leg, Right lower leg, Left lower leg         Bathing assist Assist Level: Independent     Upper Body Dressing/Undressing Upper body dressing   What is the patient wearing?: Pull over shirt    Upper body assist Assist Level: Independent    Lower Body Dressing/Undressing Lower body dressing      What is the patient wearing?: Underwear/pull up, Pants     Lower body assist Assist for lower body dressing: Independent     Toileting Toileting    Toileting assist Assist for toileting: Independent     Transfers Chair/bed transfer  Transfers assist     Chair/bed transfer assist level: Independent     Locomotion Ambulation   Ambulation assist  Assist level: Contact Guard/Touching assist Assistive device: No Device Max distance: 150'   Walk 10 feet activity   Assist     Assist level: Supervision/Verbal cueing Assistive device: No Device   Walk 50 feet activity   Assist    Assist level: Contact Guard/Touching assist Assistive device: No Device    Walk 150 feet activity   Assist Walk 150 feet activity did not occur: Safety/medical concerns  Assist level: Contact Guard/Touching assist Assistive device: No Device    Walk 10 feet on uneven surface  activity   Assist     Assist level: Moderate Assistance - Patient - 50 - 74% Assistive device: Hand held assist   Wheelchair     Assist Is the patient using a wheelchair?: Yes (will be primary  ambulator) Type of Wheelchair: Manual    Wheelchair assist level: Dependent - Patient 0%      Wheelchair 50 feet with 2 turns activity    Assist        Assist Level: Dependent - Patient 0%   Wheelchair 150 feet activity     Assist      Assist Level: Dependent - Patient 0%   Blood pressure 117/75, pulse (!) 56, temperature 97.9 F (36.6 C), temperature source Oral, resp. rate 16, height 5\' 7"  (1.702 m), weight 80.6 kg, SpO2 97%.  Medical Problem List and Plan: 1. 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  Antithrombotics: -DVT/anticoagulation:  Pharmaceutical: Lovenox 40mg  daily -antiplatelet therapy: Aspirin and Plavix for three weeks followed by aspirin alone   3. Pain Management: Tylenol, Robaxin as needed             -continue gabapentin 100 mg q HS    4. Mood/Behavior/Sleep/Anxiety: LCSW to evaluate and provide emotional support             -antipsychotic agents: n/a -05/04/23 Buspar 5mg  TID PRN added- taking only daily increase to BID and add lexapro 5mg  every day Feeling less stressed today     5. Neuropsych/cognition: This patient is capable of making decisions on his own behalf.   6. Skin/Wound Care: Routine skin care checks   7. 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  Constipation.  No bowel movement since admission.   -Dose of mag citrate on admission -Continue twice daily Senokot-S. -05/05/23 LBM today finally, continue senokot for now but hopefully can d/c-- might be better to try softeners instead.   10. Active smoker: discussed trying nicotine lozenges when he has cravings  11. Bradycardia: medications reviewed and is not on any rate lowering agents  12. L eyelid discomfort: Resolved      LOS: 6 days A FACE TO FACE EVALUATION WAS PERFORMED  Erick Colace 05/09/2023, 9:04 AM

## 2023-05-09 NOTE — Progress Notes (Signed)
 Physical Therapy Discharge Summary  Patient Details  Name: Charles Benitez MRN: [id number] Date of Birth: Jul 08, 1975  Date of Discharge from PT service:May 09, 2023     Patient has met 10 of 10 long term goals due to improved balance, improved postural control, increased strength, functional use of  left upper extremity and left lower extremity, improved awareness, and improved coordination.  Patient to discharge at an ambulatory level Independent.   Patient's care partner is independent to provide the necessary physical and cognitive assistance at discharge.Pt's mother completed family education on 3/6 (see note for further details)  Reasons goals not met: n/a all goals met  Recommendation:  Patient will benefit from ongoing skilled PT services in outpatient setting to continue to advance safe functional mobility, address ongoing impairments in coordination, balance, cognition, safety awareness, and to continue to minimize fall risk.  Equipment: No equipment provided  Reasons for discharge: treatment goals met  Patient/family agrees with progress made and goals achieved: Yes  PT Discharge Precautions/Restrictions Precautions Precautions: Fall Precaution/Restrictions Comments: fall,  R side sensory impairments (temp and pain) Restrictions Weight Bearing Restrictions Per Provider Order: No   Pain Interference  Pain Interference Pain Effect on Sleep: 1. Rarely or not at all Pain Interference with Therapy Activities: 1. Rarely or not at all Pain Interference with Day-to-Day Activities: 1. Rarely or not at all Vision/Perception  Vision - History Ability to See in Adequate Light: 0 Adequate Perception Perception: Within Functional Limits Praxis Praxis: WFL  Cognition Overall Cognitive Status: Within Functional Limits for tasks assessed Arousal/Alertness: Awake/alert Orientation Level: Oriented X4 Attention: Sustained;Selective Sustained Attention: Impaired Selective  Attention: Impaired Memory: Appears intact Awareness: Appears intact Problem Solving: Appears intact Safety/Judgment: Appears intact Comments: baseline attention deficits Sensation Sensation Light Touch: Impaired Detail Central sensation comments: reports numbness in right arm, right side and right leg. Unable to feel needle pricks. Light Touch Impaired Details: Impaired RUE;Impaired RLE Hot/Cold: Impaired Detail Hot/Cold Impaired Details: Impaired RUE;Impaired RLE Proprioception:  (slow to determine but intact at UE) Coordination Gross Motor Movements are Fluid and Coordinated: No Fine Motor Movements are Fluid and Coordinated: No Coordination and Movement Description: LLE mild ataxia with higher level mobility - improved from eval Heel Shin Test: [facility] Motor  Motor Motor: Ataxia;Motor impersistence Motor - Discharge Observations: R sensory impairments, L sided ataxia - ataxia significantly improved from eval  Mobility Bed Mobility Bed Mobility: Sit to Supine;Supine to Sit Rolling Right: Independent Rolling Left: Independent Supine to Sit: Independent Sit to Supine: Independent Transfers Transfers: Sit to Stand;Stand to Sit;Stand Pivot Transfers Sit to Stand: Independent Stand to Sit: Independent Stand Pivot Transfers: Independent Transfer (Assistive device): None Locomotion  Gait Ambulation: Yes Gait Assistance: Independent Gait Distance (Feet): 1200 Feet Assistive device: None Gait Gait: Yes Gait Pattern: Impaired Gait Pattern: Step-through pattern;Decreased weight shift to left Ankle - Stance Phase - Impaired Gait Pattern: Decreased push off/heel off - Left Pelvis - Stance Phase - Impaired Gait Pattern: Decreased weight shift - Left;Decreased weight shift - Right Stairs / Additional Locomotion Stairs: Yes Stairs Assistance: Independent with assistive device Stair Management Technique: One rail Right Number of Stairs: 12 Height of Stairs: 6 Ramp:  Independent Curb: Independent Wheelchair Mobility Wheelchair Mobility: No  Trunk/Postural Assessment  Cervical Assessment Cervical Assessment: Within Functional Limits Thoracic Assessment Thoracic Assessment: Within Functional Limits Lumbar Assessment Lumbar Assessment: Within Functional Limits Postural Control Protective Responses: minor delay - significantly imporved from eval  Balance Balance Balance Assessed: Yes Standardized Balance Assessment Standardized Balance Assessment:  Berg Balance Test;Functional Gait Assessment Berg Balance Test Sit to Stand: Able to stand without using hands and stabilize independently Standing Unsupported: Able to stand safely 2 minutes Sitting with Back Unsupported but Feet Supported on Floor or Stool: Able to sit safely and securely 2 minutes Stand to Sit: Sits safely with minimal use of hands Transfers: Able to transfer safely, minor use of hands Standing Unsupported with Eyes Closed: Able to stand 10 seconds safely Standing Ubsupported with Feet Together: Able to place feet together independently and stand for 1 minute with supervision From Standing, Reach Forward with Outstretched Arm: Can reach confidently >25 cm (10") From Standing Position, Pick up Object from Floor: Able to pick up shoe safely and easily From Standing Position, Turn to Look Behind Over each Shoulder: Looks behind one side only/other side shows less weight shift Turn 360 Degrees: Able to turn 360 degrees safely but slowly Standing Unsupported, Alternately Place Feet on Step/Stool: Able to stand independently and safely and complete 8 steps in 20 seconds Standing Unsupported, One Foot in Front: Able to plae foot ahead of the other independently and hold 30 seconds Standing on One Leg: Able to lift leg independently and hold 5-10 seconds Total Score: 50 Static Sitting Balance Static Sitting - Balance Support: Feet supported Static Sitting - Level of Assistance: 7:  Independent Dynamic Sitting Balance Dynamic Sitting - Balance Support: Feet supported;During functional activity Dynamic Sitting - Level of Assistance: 7: Independent Static Standing Balance Static Standing - Balance Support: During functional activity;No upper extremity supported (mild difficulty with very NBOS) Static Standing - Level of Assistance: 7: Independent Dynamic Standing Balance Dynamic Standing - Balance Support: During functional activity Dynamic Standing - Level of Assistance: 7: Independent Functional Gait  Assessment Gait assessed : Yes Gait Level Surface: Walks 20 ft in less than 5.5 sec, no assistive devices, good speed, no evidence for imbalance, normal gait pattern, deviates no more than 6 in outside of the 12 in walkway width. Change in Gait Speed: Able to smoothly change walking speed without loss of balance or gait deviation. Deviate no more than 6 in outside of the 12 in walkway width. Gait with Horizontal Head Turns: Performs head turns smoothly with no change in gait. Deviates no more than 6 in outside 12 in walkway width Gait with Vertical Head Turns: Performs task with slight change in gait velocity (eg, minor disruption to smooth gait path), deviates 6 - 10 in outside 12 in walkway width or uses assistive device Gait and Pivot Turn: Pivot turns safely in greater than 3 sec and stops with no loss of balance, or pivot turns safely within 3 sec and stops with mild imbalance, requires small steps to catch balance. Step Over Obstacle: Is able to step over one shoe box (4.5 in total height) without changing gait speed. No evidence of imbalance. Gait with Narrow Base of Support: Ambulates 7-9 steps. Gait with Eyes Closed: Walks 20 ft, no assistive devices, good speed, no evidence of imbalance, normal gait pattern, deviates no more than 6 in outside 12 in walkway width. Ambulates 20 ft in less than 7 sec. Ambulating Backwards: Walks 20 ft, no assistive devices, good speed,  no evidence for imbalance, normal gait Steps: Alternating feet, no rail. Total Score: 26 FGA comment:: Low fall risk Extremity Assessment    See OT d/c summary for further UE details   RLE Assessment RLE Assessment: Within Functional Limits LLE Assessment General Strength Comments: grossly4+/5; significant improvement in functional carryover and coordination  with gait - occasional slight decrease noted as fatigues.   Loel Dubonnet PT, DPT, CSRS 05/08/2023, 5:53 PM  Philip Aspen, PT, DPT, CBIS 05/09/23 2:42 PM

## 2023-05-09 NOTE — Progress Notes (Addendum)
 Inpatient Rehabilitation Care Coordinator Discharge Note   Patient Details  Name: Charles Benitez MRN: [id number] Date of Birth: September 29, 1975   Discharge location: HOME WITH MOM AND 47 YO  Length of Stay: 7 days  Discharge activity level: INDEPENDENT LEVEL  Home/community participation: ACTIVE  Patient response WU:[id number] Literacy - How often do you need to have someone help you when you read instructions, pamphlets, or other written material from your doctor or pharmacy?: Never  Patient response YN:WGNFAO Isolation - How often do you feel lonely or isolated from those around you?: Never  Services provided included: MD, RD, PT, OT, SLP, RN, CM, TR, Pharmacy, Neuropsych, SW  Financial Services:  Field seismologist Utilized: Other (Comment) (UNINSURED-MAY BE ELIGIBLE FOR MEDICAID WITH MINOR CHILD)    Choices offered to/list presented to: PT AND MOM  Follow-up services arranged:  Other (Comment) ([facility])     NO EQUIPMENT NEEDS MATCH PLACED FOR MEDICATION ASSISTANCE FOR 30 DAYS    PT TO CALL [place] DSS TO SEE IF ELIGIBLE FOR MEDICAID SINCE HAS MINOR CHILD IN THE HOME RESOURCES GIVEN FOR MENTAL HEALTH FOLLOW UP Patient response to transportation need: Is the patient able to respond to transportation needs?: Yes In the past 12 months, has lack of transportation kept you from medical appointments or from getting medications?: No In the past 12 months, has lack of transportation kept you from meetings, work, or from getting things needed for daily living?: No   Patient/Family verbalized understanding of follow-up arrangements:  Yes  Individual responsible for coordination of the follow-up plan: SELF [phone]  Confirmed correct DME delivered: Lucy Chris 05/09/2023    Comments (or additional information): MOM CAME IN FOR EDUCATION YESTERDAY AND BOTH COMFORTABLE WITH GOING HOME TOMORROW.   Summary of Stay    Date/Time Discharge  Planning CSW  05/08/23 0914 HOme with mom and 47 yo, doing well and progressing in therapies. Will be a match for prescriptions assistance and will mae referral to [facility] RGD       Doninique Lwin, Lemar Livings

## 2023-05-09 NOTE — Progress Notes (Signed)
 Physical Therapy Session Note  Patient Details  Name: Charles Benitez MRN: [id number] Date of Birth: Oct 09, 1975  Today's Date: 05/09/2023 PT Individual Time: 2130-8657 PT Individual Time Calculation (min): 40 min   Short Term Goals: Week 1:  PT Short Term Goal 1 (Week 1): Pt will perform basic transfers with supervision PT Short Term Goal 2 (Week 1): Pt will be able to perform gait x 150' with min assist PT Short Term Goal 3 (Week 1): Pt will be able to perform a flight of stairs with min assist for bedroom access  Skilled Therapeutic Interventions/Progress Updates:    Session focused on family education with pt and pt's mom who will be providing assist at home. Reviewed overall mobility levels, demonstrating transfers, bed mobility, gait without device, car transfers, stair negotiation, floor transfer, and community mobilty on uneven surfaces (ramp, curb and mulch). Reviewed safety, current impairments in sensation/temperature, fall risk, and memory/attention deficits (baseline but pt;s mom also reports she notices his memory is worse). Reviewed signs and symptoms of stroke and what to do in case of emergency as well as overall energy conservation and return to activity recommendations. Reviewed that pt is not cleared by MD for driving and recommending supervision for any activities outside of the home at this time. Pt and mom verbalized understanding of all reviewed and deny any concerns in regards to d/c or follow up recommendations.   Therapy Documentation Precautions:  Precautions Precautions: Fall Precaution/Restrictions Comments: fall,  R side sensory impairments (temp and pain) Restrictions Weight Bearing Restrictions Per Provider Order: No    Pain: No reports of pain    Therapy/Group: Individual Therapy  Karolee Stamps Darrol Poke, PT, DPT, CBIS  05/09/2023, 2:36 PM

## 2023-05-09 NOTE — Plan of Care (Signed)
  Problem: RH Balance Goal: LTG Patient will maintain dynamic standing with ADLs (OT) Description: LTG:  Patient will maintain dynamic standing balance with assist during activities of daily living (OT)  Outcome: Completed/Met   Problem: RH Bathing Goal: LTG Patient will bathe all body parts with assist levels (OT) Description: LTG: Patient will bathe all body parts with assist levels (OT) Outcome: Completed/Met   Problem: RH Dressing Goal: LTG Patient will perform lower body dressing w/assist (OT) Description: LTG: Patient will perform lower body dressing with assist, with/without cues in positioning using equipment (OT) Outcome: Completed/Met   Problem: RH Toileting Goal: LTG Patient will perform toileting task (3/3 steps) with assistance level (OT) Description: LTG: Patient will perform toileting task (3/3 steps) with assistance level (OT)  Outcome: Completed/Met   Problem: RH Light Housekeeping Goal: LTG Patient will perform light housekeeping w/assist (OT) Description: LTG: Patient will perform light housekeeping with assistance, with/without cues (OT). Outcome: Completed/Met   Problem: RH Toilet Transfers Goal: LTG Patient will perform toilet transfers w/assist (OT) Description: LTG: Patient will perform toilet transfers with assist, with/without cues using equipment (OT) Outcome: Completed/Met   Problem: RH Tub/Shower Transfers Goal: LTG Patient will perform tub/shower transfers w/assist (OT) Description: LTG: Patient will perform tub/shower transfers with assist, with/without cues using equipment (OT) Outcome: Completed/Met

## 2023-05-10 DIAGNOSIS — F411 Generalized anxiety disorder: Secondary | ICD-10-CM

## 2023-05-10 DIAGNOSIS — I69398 Other sequelae of cerebral infarction: Principal | ICD-10-CM

## 2023-05-10 DIAGNOSIS — R209 Unspecified disturbances of skin sensation: Secondary | ICD-10-CM

## 2023-05-10 DIAGNOSIS — R270 Ataxia, unspecified: Secondary | ICD-10-CM

## 2023-05-10 NOTE — Progress Notes (Signed)
 Inpatient Rehabilitation Discharge Medication Review by a Pharmacist  A complete drug regimen review was completed for this patient to identify any potential clinically significant medication issues.    High Risk Drug Classes Is patient taking? Indication by Medication  Antipsychotic No   Anticoagulant No   Antibiotic No   Opioid No   Antiplatelet Yes Aspirin, clopidogrel thru 05/23/2023 then continue Aspirin monotherapy  - stroke prophylaxis  Hypoglycemics/insulin No   Vasoactive Medication No   Chemotherapy No   Other Yes Acetaminophen- pain Buspar, lexapro-mood/behavior/anxiety Gabapentin - neuropathy Senna- constipation      Type of Medication Issue Identified Description of Issue Recommendation(s)  Drug Interaction(s) (clinically significant)     Duplicate Therapy     Allergy     No Medication Administration End Date     Incorrect Dose     Additional Drug Therapy Needed     Significant med changes from prior encounter (inform family/care partners about these prior to discharge).     Other       Clinically significant medication issues were identified that warrant physician communication and completion of prescribed/recommended actions by midnight of the next day:  No   Pharmacist comments:  Per Neurology 2/27 note recommendations: ASA 81mg  daily + plavix 75mg  daily x21 days (thru 05/23/2023) followed by ASA 81mg  daily monotherapy after that.   Time spent performing this drug regimen review (minutes): 20    Thank you for allowing pharmacy to be part of this patients care team.  Noah Delaine, RPh Clinical Pharmacist 05/10/2023 10:06 AM

## 2023-05-10 NOTE — Progress Notes (Signed)
 Patient seen by MD and PA for discharge instructions. Patient informed family member that he is ready to picked up.   Patient transferred out for discharge with mother in wheelchair with PCT. TOC meds will be picked on the way.

## 2023-05-10 NOTE — Progress Notes (Addendum)
 PROGRESS NOTE   Subjective/Complaints:  Excited about d/c today  Reportedly has PCP but cannot find visits in [facility] THinks he has insurance because of his son but would like to get more information on this   ROS: +anxiety,  As per HPI. Denies CP, SOB, abd pain, N/V/D/C, or any other complaints at this time.     Objective:   No results found. No results for input(s): "WBC", "HGB", "HCT", "PLT" in the last 72 hours.  No results for input(s): "NA", "K", "CL", "CO2", "GLUCOSE", "BUN", "CREATININE", "CALCIUM" in the last 72 hours.   Intake/Output Summary (Last 24 hours) at 05/10/2023 0805 Last data filed at 05/10/2023 0803 Gross per 24 hour  Intake 1818 ml  Output 1350 ml  Net 468 ml        Physical Exam: Vital Signs Blood pressure 117/80, pulse 64, temperature 97.9 F (36.6 C), resp. rate 16, height 5\' 7"  (1.702 m), weight 80.6 kg, SpO2 97%.  Gen: no distress, normal appearing, sitting up at EOB  General: No acute distress Mood and affect are appropriate Heart: Regular rate and rhythm no rubs murmurs or extra sounds Lungs: Clear to auscultation, breathing unlabored, no rales or wheezes Abdomen: Positive bowel sounds, soft nontender to palpation, nondistended Extremities: No clubbing, cyanosis, or edema Skin: No evidence of breakdown, no evidence of rash   Neurologic exam:  Cognition: AAO to person, place, time and event.  Language: Fluent, No substitutions or neoglisms. No dysarthria.Insight: Good  insight into current condition.  Mood: Pleasant affect, appropriate mood.  Sensation: Right hemisensory loss to  pain, and temperature RUE>RLE, LT intact    CN: 2,3,4,5 , 7 intact  Coordination: No apparent tremors. No ataxia on FTN or HTS Spasticity: MAS 0 in all extremities.       Strength:               5/5 in BUE and BLE   Assessment/Plan: 1. 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 excellent recovery  thus far  Stable for D/C today F/u PCP in 3-4 weeks F/u PM&R 2 weeks See D/C summary See D/C instructions   Care Tool:  Bathing    Body parts bathed by patient: Right arm, Face, Left arm, Chest, Abdomen, Front perineal area, Buttocks, Right upper leg, Left upper leg, Right lower leg, Left lower leg         Bathing assist Assist Level: Independent     Upper Body Dressing/Undressing Upper body dressing   What is the patient wearing?: Pull over shirt    Upper body assist Assist Level: Independent    Lower Body Dressing/Undressing Lower body dressing      What is the patient wearing?: Underwear/pull up, Pants     Lower body assist Assist for lower body dressing: Independent     Toileting Toileting    Toileting assist Assist for toileting: Independent     Transfers Chair/bed transfer  Transfers assist     Chair/bed transfer assist level: Independent     Locomotion Ambulation   Ambulation assist      Assist level: Independent Assistive device: No Device Max distance: 300'   Walk 10 feet activity  Assist     Assist level: Independent Assistive device: No Device   Walk 50 feet activity   Assist    Assist level: Independent Assistive device: No Device    Walk 150 feet activity   Assist Walk 150 feet activity did not occur: Safety/medical concerns  Assist level: Independent Assistive device: No Device    Walk 10 feet on uneven surface  activity   Assist     Assist level: Supervision/Verbal cueing Assistive device: Hand held assist   Wheelchair     Assist Is the patient using a wheelchair?: No Type of Wheelchair: Manual    Wheelchair assist level: Dependent - Patient 0%      Wheelchair 50 feet with 2 turns activity    Assist        Assist Level: Dependent - Patient 0%   Wheelchair 150 feet activity     Assist      Assist Level: Dependent - Patient 0%   Blood pressure 117/80, pulse 64,  temperature 97.9 F (36.6 C), resp. rate 16, height 5\' 7"  (1.702 m), weight 80.6 kg, SpO2 97%.  Medical Problem List and Plan: 1. 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  Antithrombotics: -DVT/anticoagulation:  Pharmaceutical: Lovenox 40mg  daily -antiplatelet therapy: Aspirin and Plavix for three weeks followed by aspirin alone, last dose Plavix 05/22/23   3. Pain Management: Tylenol, Robaxin as needed             -continue gabapentin 100 mg q HS for dysesthetic pain    4. Mood/Behavior/Sleep/Anxiety: LCSW to evaluate and provide emotional support             -antipsychotic agents: n/a -05/04/23 Buspar 5mg  TID PRN added- taking only daily increase to BID and add lexapro 5mg  every day PCP should be able to manage, do not think pt needs psychiatry referral at this time     5. Neuropsych/cognition: This patient is capable of making decisions on his own behalf.   6. Skin/Wound Care: Routine skin care checks   7. 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  Constipation.  No bowel movement since admission.   -Dose of mag citrate on admission -Continue twice daily Senokot-S. -05/05/23 LBM today finally, continue senokot for now but hopefully can d/c-- might be better to try softeners instead.   10. Active smoker: discussed trying nicotine lozenges when he has cravings  11. Bradycardia: medications reviewed and is not on any rate lowering agents  12. L eyelid discomfort: Resolved     LOS: 7 days A FACE TO FACE EVALUATION WAS PERFORMED  Erick Colace 05/10/2023, 8:05 AM

## 2023-05-10 NOTE — Consult Note (Signed)
 Neuropsychological Consultation Comprehensive Inpatient Rehab   Patient:   Charles Benitez   DOB:   27-Mar-1975  MR Number:  [id number]  Location:  [facility] [address] CENTER [address] [place] [address] Dept: [phone] Loc: [phone]            Date of Service:   05/10/2023  Start Time:   8 AM End Time:   9 AM  Provider/Observer:  Arley Phenix, Psy.D.       Clinical Neuropsychologist       Billing Code/Service: [phone]  Reason for Service:    [name] is a 48 year old male referred for neuropsychological consultation during his ongoing admission to the comprehensive inpatient rehabilitation unit.  Patient with good health prior to presentation to emergency department on 05/01/2023.  Patient was complaining of left-sided headache with pain behind his left eye, left facial numbness and paresthesia of both hands with unsteady gait.  Patient was outside of tPA window.  Symptoms did improve upon arrival.  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  Neurology consult was initiated and started on anticoagulation therapy.  Patient treated and stabilized and then therapy evaluations were completed with demonstrations of impaired capacity to complete ADLs and functional mobility issues secondary to decreased left upper and left lower extremity functions.  Balance, decreased safety awareness and limited awareness and understanding of his status poststroke were noted.  Patient was admitted to the CIR due to ongoing dysfunction secondary to left medullary infarct.  During today's visit, the patient was oriented x 4 but was quite anxious with intrusive thoughts and significant apprehension.  Patient is being discharged today.  Patient had just seen attending that helped answer numerous questions but the patient noted that his normal pattern in life is to become hyper fixated on a number of things with significant OCD  type symptoms and also would likely panic events although these were not thoroughly investigated.  However, anxiety is a longstanding feature.  Patient notes constantly being worried about his family and his status and has a number of psychosocial stressors right now.  Patient highly motivated to continue to make improvements and hoping to get back to capacity to work as soon as possible.  Patient notes long history of being compliant with any medical recommendations and reports he will be quite diligent with his medication regimen post discharge.  Anxiety is one of the bigger issues that he has been facing not only acutely but also chronically.  Today we worked on coping and adjustment issues and helping to develop some primary coping strategies around his anxiety disorder as it does have a direct impact on his overall medical status and his recovery.  HPI for the current admission:    HPI: Charles Benitez is a 48 year old male in his usual state of excellent health who presented to the emergency department on 05/01/2023 complaining of left-sided headache with pain behind the left eye, left facial numbness and paresthesias of both hands with unsteady gait. He was outside tPA window. Symptoms improved upon arrival. 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 Neurology evaluated and advised DAPT for 21 days followed by aspirin monotherapy. His LDL was less than 70 so statin therapy was not initiated. Hypercoagulable panel ordered and the results are pending. Zio patch placed. TEE and OT evaluations obtained. The patient lives with his mother and 75 year old son and also provides care for his 41-year-old daughter regularly. During OT session, he demonstrated impaired ADL  performance and functional mobility secondary to decreased functional use of his left upper and left lower extremities. He demonstrates decreased balance, decreased safety awareness and limited cognition in setting of his stroke. Is  tolerating regular diet. Hemoglobin A1c 5.3%. Echocardiogram with bubble study: EF estimated 60 to 65%, no atrial level shunt, no evidence of patent foramen ovale. No evidence of atrial septal defect. The patient requires inpatient medicine and rehabilitation evaluations and services for ongoing dysfunction secondary to left lateral medullary infarct.   Medical History:   Past Medical History:  Diagnosis Date   Asthma    Chronic back pain          Patient Active Problem List   Diagnosis Date Noted   Generalized anxiety disorder 05/10/2023   CVA (cerebral vascular accident) (HCC) 05/03/2023   Acute CVA (cerebrovascular accident) (HCC) 05/02/2023   Family history of premature coronary artery disease 05/02/2023   Chronic bilateral low back pain with bilateral sciatica 03/18/2020   Chronic neck pain 03/18/2020   DDD (degenerative disc disease), lumbar 03/18/2020   Osteoarthritis of spine with radiculopathy, cervicothoracic region 03/18/2020    Behavioral Observation/Mental Status:   Rameen Quinney  presents as a 48 y.o.-year-old Right handed Hispanic Male who appeared his stated age. his dress was Appropriate and he was Well Groomed and his manners were Appropriate to the situation.  his participation was indicative of Inattentive and Redirectable behaviors that was primarily related to his longstanding anxiety status with exacerbation with pending discharge and ongoing but improved residual effects of his medullary stroke..  There were physical disabilities noted primarily around paresthesias of his hand and arm.  he displayed an appropriate level of cooperation and motivation.    Interactions:    Active Appropriate and Redirectable  Attention:   abnormal and issues with internal preoccupation and anxiety impacting attention  Memory:   within normal limits; recent and remote memory intact  Visuo-spatial:   not examined  Speech (Volume):  normal  Speech:   normal; normal  Thought  Process:  Coherent and Tangential  Coherent, Distracted, and Obsessive  Though Content:  Rumination; not suicidal and not homicidal  Orientation:   person, place, time/date, and situation  Judgment:   Good  Planning:   Fair  Affect:    Anxious  Mood:    Anxious  Insight:   Good  Intelligence:   normal  Psychiatric History:  Patient with longstanding history of anxiety and depressive symptomatologies that have not been addressed medically previously.  Patient has been started on BuSpar to aid with anxiety symptoms and started on Lexapro during his hospital stay.  Patient will continue on Lexapro.  Instructions were given to the patient around time expectations as this medicine was just started 4 days ago it has not had time to fully take effect.  Family Med/Psych History: History reviewed. No pertinent family history.  Risk of Suicide/Violence: virtually non-existent patient denies any suicidal or homicidal ideation and has a lot of motivation to continue to make improvements.    Impression/DX:   Mikaele Stecher is a 48 year old male referred for neuropsychological consultation during his ongoing admission to the comprehensive inpatient rehabilitation unit.  Patient with good health prior to presentation to emergency department on 05/01/2023.  Patient was complaining of left-sided headache with pain behind his left eye, left facial numbness and paresthesia of both hands with unsteady gait.  Patient was outside of tPA window.  Symptoms did improve upon arrival.  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  Neurology consult was initiated and started on anticoagulation therapy.  Patient treated and stabilized and then therapy evaluations were completed with demonstrations of impaired capacity to complete ADLs and functional mobility issues secondary to decreased left upper and left lower extremity functions.  Balance, decreased safety awareness and limited awareness and  understanding of his status poststroke were noted.  Patient was admitted to the CIR due to ongoing dysfunction secondary to left medullary infarct.  During today's visit, the patient was oriented x 4 but was quite anxious with intrusive thoughts and significant apprehension.  Patient is being discharged today.  Patient had just seen attending that helped answer numerous questions but the patient noted that his normal pattern in life is to become hyper fixated on a number of things with significant OCD type symptoms and also would likely panic events although these were not thoroughly investigated.  However, anxiety is a longstanding feature.  Patient notes constantly being worried about his family and his status and has a number of psychosocial stressors right now.  Patient highly motivated to continue to make improvements and hoping to get back to capacity to work as soon as possible.  Patient notes long history of being compliant with any medical recommendations and reports he will be quite diligent with his medication regimen post discharge.  Anxiety is one of the bigger issues that he has been facing not only acutely but also chronically.  Today we worked on coping and adjustment issues and helping to develop some primary coping strategies around his anxiety disorder as it does have a direct impact on his overall medical status and his recovery.  Disposition/Plan:  Today we worked on coping and adjustment issues and the patient will continue to work on recovery post discharge.  He has been started on both Lexapro and BuSpar and will follow-up with PCP that has now been established and he has an appointment set up.  Diagnosis:    Generalized anxiety disorder with obsessive compulsive types of thinking and potential past panic event/attack.         Electronically Signed   _______________________ Arley Phenix, Psy.D. Clinical Neuropsychologist

## 2023-05-10 NOTE — Progress Notes (Signed)
 Patient ID: Charles Benitez, male   DOB: 30-Jun-1975, 48 y.o.   MRN: [id number] Follow up with the patient regarding alerts on his ZIO patch phone and how to address the issue. Also reviewed changing of the monitoring clip if alert noted or flashdrive flashes red. Patient understands to pack all of the items and return in the self addressed box on 05/31/23; with a cardiology appointment scheduled on June 12, 2023.  Mother requested information on patient's diagnosis and medications/care, etc in [place]. Information provided to the patient in spanish for his mother who will be assisting at discharge including information on high triglyceride dietary modifications, managing hypertension, smoking risks/cessation tips and managing stress/insomnia. Patient reported he feels prepared and ready for discharge. [name]

## 2023-05-15 ENCOUNTER — Ambulatory Visit: Payer: Self-pay | Admitting: Family Medicine

## 2023-05-15 ENCOUNTER — Encounter: Payer: Self-pay | Admitting: Family Medicine

## 2023-05-15 VITALS — BP 124/80 | HR 73 | Ht 67.0 in | Wt 178.0 lb

## 2023-05-15 DIAGNOSIS — F411 Generalized anxiety disorder: Secondary | ICD-10-CM | POA: Diagnosis not present

## 2023-05-15 DIAGNOSIS — I6930 Unspecified sequelae of cerebral infarction: Secondary | ICD-10-CM | POA: Insufficient documentation

## 2023-05-15 MED ORDER — ESCITALOPRAM OXALATE 5 MG PO TABS: 5.0000 mg | ORAL_TABLET | Freq: Every day | ORAL | 1 refills | Status: DC

## 2023-05-15 MED ORDER — BUSPIRONE HCL 5 MG PO TABS
5.0000 mg | ORAL_TABLET | Freq: Two times a day (BID) | ORAL | 1 refills | Status: AC
Start: 2023-05-15 — End: ?

## 2023-05-15 NOTE — Progress Notes (Signed)
 Subjective:    Patient ID: Charles Benitez, male    DOB: 02/25/1976, 48 y.o.   MRN: [id number]  Charles Benitez is a 48 y.o. male presenting on 05/15/2023 for History of Stroke   HPI  Discussed the use of AI scribe software for clinical note transcription with the patient, who gave verbal consent to proceed.  History of Present Illness     HOSPITAL FOLLOW-UP VISIT  Hospital/Location: [facility] Date of Admission: 05/01/23 Transferred to [facility] on 05/03/23 Date of Discharge: 05/10/23 Transitions of care telephone call: Not completecd.  Reason for Admission: Stroke  - Hospital H&P and Discharge Summary have been reviewed - Patient presents today 5 days after recent hospitalization. Brief summary of recent course, patient had symptoms of focal weakness L sided lower extremity weakness and paresthesia, hospitalized, found to have Stroke  He experienced an ischemic stroke, specifically a left lateral medullary infarct, which led to his initial hospitalization on May 01, 2023, at [facility]. He was transferred to [facility] on May 03, 2023, where an MRI confirmed the diagnosis. Initial symptoms included headache, numbness, weakness in the LEFT lower leg, and difficulty walking. He underwent inpatient rehabilitation to improve mobility and strength, particularly in the left leg.  Currently, he experiences fatigue and shortness of breath with minimal exertion, such as talking. He also reports lightheadedness and dizziness, particularly when turning or changing direction, which affects his balance. He uses a cane for mobility and notes that his left leg has improved in range of motion but still feels tight.  He was discharged with a prescription for Plavix 75 mg daily for 21 days, ending on May 31, 2023, and aspirin 81 mg daily. He is also using a Ziopatch monitor, which is scheduled to be sent back on May 31, 2023. His cholesterol levels were low,  and he does not require medication for cholesterol management. He is taking Buspar for anxiety and depression, which he finds helpful, taking one pill in the morning and one in the afternoon.  He has a history of smoking marijuana for relaxation and anxiety management but reports that the current medications have reduced his urge to smoke. He is not currently working and expresses concern about his financial situation and ability to work in the future.   Significant improvement Left lower extremity strength has improved, and range of motion.  Still has some headaches  Hypercoaguablility panel checked, all negative, except Protein S Ag %  Upcoming appointments - 05/22/23 - Physical Medicine specialist 05/22/23 - 06/12/23 - Dr Clotilde Dieter DO [facility] - 07/02/23 - Dr Theora Master MD [facility] Neurology  - New medications on discharge: Plavix, Buspar, Escitalopram - Changes to current meds on discharge: n/a  I have reviewed the discharge medication list, and have reconciled the current and discharge medications today.   Current Outpatient Medications:    acetaminophen (TYLENOL) 325 MG tablet, Take 1-2 tablets (325-650 mg total) by mouth every 4 (four) hours as needed for mild pain (pain score 1-3)., Disp: , Rfl:    aspirin EC 81 MG tablet, Take 1 tablet (81 mg total) by mouth daily. Swallow whole., Disp: 100 tablet, Rfl: 0   clopidogrel (PLAVIX) 75 MG tablet, Take 1 tablet (75 mg total) by mouth daily for 13 days., Disp: 13 tablet, Rfl: 0   gabapentin (NEURONTIN) 100 MG capsule, Take 1 capsule (100 mg total) by mouth at bedtime., Disp: 30 capsule, Rfl: 0   senna (SENOKOT) 8.6 MG TABS tablet, Take  2 tablets (17.2 mg total) by mouth at bedtime as needed for mild constipation., Disp: 60 tablet, Rfl: 0   busPIRone (BUSPAR) 5 MG tablet, Take 1 tablet (5 mg total) by mouth 2 (two) times daily., Disp: 180 tablet, Rfl: 1   escitalopram (LEXAPRO) 5 MG tablet, Take 1 tablet (5 mg total) by  mouth daily., Disp: 90 tablet, Rfl: 1  ------------------------------------------------------------------------- Social History   Tobacco Use   Smoking status: Every Day    Current packs/day: 0.66    Types: Cigarettes   Smokeless tobacco: Never  Substance Use Topics   Alcohol use: Yes    Comment: 2-3 drinks on the weekends    Drug use: Yes    Types: Marijuana    Review of Systems Per HPI unless specifically indicated above     Objective:    BP 124/80   Pulse 73   Ht 5\' 7"  (1.702 m)   Wt 178 lb (80.7 kg)   SpO2 99%   BMI 27.88 kg/m   Wt Readings from Last 3 Encounters:  05/15/23 178 lb (80.7 kg)  05/03/23 177 lb 11.1 oz (80.6 kg)  05/02/23 178 lb 12.7 oz (81.1 kg)    Physical Exam Vitals and nursing note reviewed.  Constitutional:      General: He is not in acute distress.    Appearance: He is well-developed. He is not diaphoretic.     Comments: Well-appearing, comfortable, cooperative  HENT:     Head: Normocephalic and atraumatic.  Eyes:     General:        Right eye: No discharge.        Left eye: No discharge.     Conjunctiva/sclera: Conjunctivae normal.  Neck:     Thyroid: No thyromegaly.  Cardiovascular:     Rate and Rhythm: Normal rate and regular rhythm.     Pulses: Normal pulses.     Heart sounds: Normal heart sounds. No murmur heard. Pulmonary:     Effort: Pulmonary effort is normal. No respiratory distress.     Breath sounds: Normal breath sounds. No wheezing or rales.  Musculoskeletal:     Cervical back: Normal range of motion and neck supple.     Comments: Left lower extremity with some stiffness slight reduced range of motion on extension, some discomfort with weakness but has 4/5 strength. Other joints upper extremities and R leg intact motion and strength 5/5  Lymphadenopathy:     Cervical: No cervical adenopathy.  Skin:    General: Skin is warm and dry.     Findings: No erythema or rash.  Neurological:     Mental Status: He is alert and  oriented to person, place, and time. Mental status is at baseline.  Psychiatric:        Behavior: Behavior normal.     Comments: Well groomed, good eye contact, normal speech and thoughts     I have personally reviewed the radiology report from 05/01/23 on CT Head.  CLINICAL DATA:  Headache and pressure behind the left eye with nausea, left-sided facial numbness and right arm numbness.   EXAM: CT HEAD WITHOUT CONTRAST   TECHNIQUE: Contiguous axial images were obtained from the base of the skull through the vertex without intravenous contrast.   RADIATION DOSE REDUCTION: This exam was performed according to the departmental dose-optimization program which includes automated exposure control, adjustment of the mA and/or kV according to patient size and/or use of iterative reconstruction technique.   COMPARISON:  None Available.  FINDINGS: Brain: No evidence of acute infarction, hemorrhage, hydrocephalus, extra-axial collection or mass lesion/mass effect.   Vascular: No hyperdense vessel or unexpected calcification.   Skull: Normal. Negative for fracture or focal lesion.   Sinuses/Orbits: No acute finding.   Other: None.   IMPRESSION: No acute intracranial pathology.     Electronically Signed   By: Aram Candela M.D.   On: 05/01/2023 18:51  --------------  I have personally reviewed the radiology report from 05/01/23 on MRI.  CLINICAL DATA:  Initial evaluation for acute headache, neuro deficit.   EXAM: MRI HEAD WITHOUT CONTRAST   MRA HEAD WITHOUT CONTRAST   MRA NECK WITHOUT AND WITH CONTRAST   TECHNIQUE: Multiplanar, multi-echo pulse sequences of the brain and surrounding structures were acquired without intravenous contrast. Angiographic images of the Circle of Willis were acquired using MRA technique without intravenous contrast. Angiographic images of the neck were acquired using MRA technique without and with intravenous contrast. Carotid stenosis  measurements (when applicable) are obtained utilizing NASCET criteria, using the distal internal carotid diameter as the denominator.   CONTRAST:  8mL GADAVIST GADOBUTROL 1 MMOL/ML IV SOLN   COMPARISON:  None Available.   FINDINGS: MRI HEAD FINDINGS   Brain: Cerebral volume within normal limits. No significant cerebral white matter disease for age. No other focal parenchymal signal abnormality.   6 mm focus of restricted diffusion seen involving the lower left lateral medulla (series 5, image 3), consistent with an acute ischemic infarct. No associated hemorrhage or mass effect. No other evidence for acute or subacute ischemia. No areas of chronic cortical infarction. No acute or chronic intracranial blood products.   No mass lesion, midline shift or mass effect. No hydrocephalus or extra-axial fluid collection. Pituitary gland within normal limits.   Vascular: Major intracranial vascular flow voids are maintained.   Skull and upper cervical spine: Craniocervical junction within normal limits. Decreased T1 signal intensity noted within the bone marrow the visualized upper cervical spine, nonspecific, but most commonly related to anemia, smoking, or obesity. No scalp soft tissue abnormality.   Sinuses/Orbits: Left gaze noted. Paranasal sinuses are largely clear. No mastoid effusion.   Other: None.   MRA HEAD FINDINGS   Anterior circulation: Both internal carotid arteries are widely patent through the siphons without stenosis. A1 segments patent bilaterally. Normal anterior communicating artery complex. Anterior cerebral arteries patent without stenosis. No M1 stenosis or occlusion. Distal MCA branches perfused and symmetric.   Posterior circulation: Visualized distal V4 segments are patent without stenosis. Left vertebral artery dominant. Neither PICA origin visualized on this portion of the exam. Basilar patent without stenosis. Superior cerebral arteries patent  bilaterally. Both PCAs primarily supplied via the basilar. PCAs are patent to their distal aspects without significant stenosis.   Anatomic variants: As above.  No aneurysm.   MRA NECK FINDINGS   Aortic arch: Examination technically limited by motion artifact. Additionally, no time-of-flight sequence is available for review at time of this dictation.   Visualized aortic arch within normal limits for caliber with standard branch pattern. No visible stenosis about the origin the great vessels.   Right carotid system: Right common and internal carotid arteries are patent within the neck. No visible stenosis or dissection.   Left carotid system: Left common and internal carotid arteries are patent within the neck. No visible stenosis or dissection.   Vertebral arteries: Both vertebral arteries appear to arise from the subclavian arteries. No visible proximal subclavian artery stenosis. Neither vertebral artery origin well visualized on this  motion degraded exam. Left vertebral artery dominant. Vertebral arteries patent within the neck without visible stenosis or dissection.   Other: None   IMPRESSION: MRI HEAD:   1. 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 2. Otherwise normal brain MRI for age.   MRA HEAD:   Normal intracranial MRA. No large vessel occlusion, hemodynamically significant stenosis, or other acute vascular abnormality.   MRA NECK:   1. Motion degraded exam. 2. Grossly negative MRA of the neck. No hemodynamically significant stenosis or other acute vascular abnormality.     Electronically Signed   By: Rise Mu M.D.   On: 05/02/2023 00:33  ----------  ECHOCARDIOGRAM REPORT       Patient Name:   RYOTT RAFFERTY Date of Exam: 05/02/2023  Medical Rec #:  [id number]        Height:       66.0 in  Accession #:    [id number]       Weight:       178.8 lb  Date of Birth:  [birth year]        BSA:          1.907 m   Patient Age:    48 years         BP:           130/74 mmHg  Patient Gender: M                HR:           55 bpm.  Exam Location:  [facility]   Procedure: 2D Echo, Cardiac Doppler, Color Doppler, Strain Analysis and  Saline            Contrast Bubble Study (Both Spectral and Color Flow Doppler  were            utilized during procedure).   Indications:     Stroke    History:         Patient has no prior history of Echocardiogram  examinations.                  Stroke.    Sonographer:     Mikki Harbor  Referring Phys:  [id number] NOAH BEDFORD ONGE  Diagnosing Phys: Julien Nordmann MD     Sonographer Comments: Image acquisition challenging due to respiratory  motion. Global longitudinal strain was attempted.  IMPRESSIONS     1. Left ventricular ejection fraction, by estimation, is 60 to 65%. The  left ventricle has normal function. The left ventricle has no regional  wall motion abnormalities. Left ventricular diastolic parameters were  normal. The average left ventricular  global longitudinal strain is -20.6 %. The global longitudinal strain is  normal.   2. Right ventricular systolic function is normal. The right ventricular  size is normal. There is normal pulmonary artery systolic pressure.   3. The mitral valve is normal in structure. No evidence of mitral valve  regurgitation. No evidence of mitral stenosis.   4. The aortic valve is normal in structure. Aortic valve regurgitation is  not visualized. No aortic stenosis is present.   5. The inferior vena cava is normal in size with greater than 50%  respiratory variability, suggesting right atrial pressure of 3 mmHg.   6. Agitated saline contrast bubble study was negative, with no evidence  of any interatrial shunt.   FINDINGS   Left Ventricle: Left ventricular ejection fraction, by estimation, is 60  to 65%. The left ventricle has normal  function. The left ventricle has no  regional wall motion abnormalities. The average  left ventricular global  longitudinal strain is -20.6 %.  Strain was performed and the global longitudinal strain is normal. The  left ventricular internal cavity size was normal in size. There is no left  ventricular hypertrophy. Left ventricular diastolic parameters were  normal.   Right Ventricle: The right ventricular size is normal. No increase in  right ventricular wall thickness. Right ventricular systolic function is  normal. There is normal pulmonary artery systolic pressure. The tricuspid  regurgitant velocity is 2.38 m/s, and   with an assumed right atrial pressure of 3 mmHg, the estimated right  ventricular systolic pressure is 25.7 mmHg.   Left Atrium: Left atrial size was normal in size.   Right Atrium: Right atrial size was normal in size.   Pericardium: There is no evidence of pericardial effusion.   Mitral Valve: The mitral valve is normal in structure. No evidence of  mitral valve regurgitation. No evidence of mitral valve stenosis. MV peak  gradient, 3.9 mmHg. The mean mitral valve gradient is 1.0 mmHg.   Tricuspid Valve: The tricuspid valve is normal in structure. Tricuspid  valve regurgitation is mild . No evidence of tricuspid stenosis.   Aortic Valve: The aortic valve is normal in structure. Aortic valve  regurgitation is not visualized. No aortic stenosis is present. Aortic  valve mean gradient measures 4.0 mmHg. Aortic valve peak gradient measures  8.4 mmHg. Aortic valve area, by VTI  measures 2.79 cm.   Pulmonic Valve: The pulmonic valve was normal in structure. Pulmonic valve  regurgitation is not visualized. No evidence of pulmonic stenosis.   Aorta: The aortic root is normal in size and structure.   Venous: The inferior vena cava is normal in size with greater than 50%  respiratory variability, suggesting right atrial pressure of 3 mmHg.   IAS/Shunts: No atrial level shunt detected by color flow Doppler. Agitated  saline contrast was given  intravenously to evaluate for intracardiac  shunting. Agitated saline contrast bubble study was negative, with no  evidence of any interatrial shunt. There   is no evidence of a patent foramen ovale. There is no evidence of an  atrial septal defect.   Additional Comments: 3D imaging was not performed.     LEFT VENTRICLE  PLAX 2D  LVIDd:         4.70 cm     Diastology  LVIDs:         2.80 cm     LV e' medial:    7.62 cm/s  LV PW:         1.10 cm     LV E/e' medial:  11.0  LV IVS:        1.10 cm     LV e' lateral:   10.90 cm/s  LVOT diam:     2.00 cm     LV E/e' lateral: 7.7  LV SV:         88  LV SV Index:   46          2D Longitudinal Strain  LVOT Area:     3.14 cm    2D Strain GLS Avg:     -20.6 %    LV Volumes (MOD)  LV vol d, MOD A2C: 52.9 ml  LV vol d, MOD A4C: 58.0 ml  LV vol s, MOD A2C: 12.3 ml  LV vol s, MOD A4C: 22.1 ml  LV SV MOD  A2C:     40.6 ml  LV SV MOD A4C:     58.0 ml  LV SV MOD BP:      39.0 ml   RIGHT VENTRICLE  RV Basal diam:  3.40 cm  RV Mid diam:    3.10 cm  RV S prime:     14.30 cm/s  TAPSE (M-mode): 2.1 cm   LEFT ATRIUM             Index        RIGHT ATRIUM           Index  LA diam:        3.90 cm 2.05 cm/m   RA Area:     14.10 cm  LA Vol (A2C):   50.5 ml 26.48 ml/m  RA Volume:   29.80 ml  15.63 ml/m  LA Vol (A4C):   40.7 ml 21.34 ml/m  LA Biplane Vol: 45.6 ml 23.91 ml/m   AORTIC VALVE                    PULMONIC VALVE  AV Area (Vmax):    2.99 cm     PV Vmax:       1.12 m/s  AV Area (Vmean):   2.73 cm     PV Peak grad:  5.0 mmHg  AV Area (VTI):     2.79 cm  AV Vmax:           145.00 cm/s  AV Vmean:          95.600 cm/s  AV VTI:            0.315 m  AV Peak Grad:      8.4 mmHg  AV Mean Grad:      4.0 mmHg  LVOT Vmax:         138.00 cm/s  LVOT Vmean:        83.100 cm/s  LVOT VTI:          0.280 m  LVOT/AV VTI ratio: 0.89    AORTA  Ao Root diam: 3.30 cm   MITRAL VALVE               TRICUSPID VALVE  MV Area (PHT): 4.04 cm    TR  Peak grad:   22.7 mmHg  MV Area VTI:   3.06 cm    TR Vmax:        238.00 cm/s  MV Peak grad:  3.9 mmHg  MV Mean grad:  1.0 mmHg    SHUNTS  MV Vmax:       0.99 m/s    Systemic VTI:  0.28 m  MV Vmean:      53.5 cm/s   Systemic Diam: 2.00 cm  MV Decel Time: 188 msec  MV E velocity: 84.20 cm/s  MV A velocity: 66.90 cm/s  MV E/A ratio:  1.26   Julien Nordmann MD  Electronically signed by Julien Nordmann MD  Signature Date/Time: 05/02/2023/4:27:23 PM       Final     Results for orders placed or performed during the hospital encounter of 05/03/23  Comprehensive metabolic panel   Collection Time: 05/04/23  6:39 AM  Result Value Ref Range   Sodium 140 135 - 145 mmol/L   Potassium 4.5 3.5 - 5.1 mmol/L   Chloride 106 98 - 111 mmol/L   CO2 25 22 - 32 mmol/L   Glucose, Bld 116 (H) 70 - 99 mg/dL   BUN 14  6 - 20 mg/dL   Creatinine, Ser 7.82 0.61 - 1.24 mg/dL   Calcium 8.6 (L) 8.9 - 10.3 mg/dL   Total Protein 5.6 (L) 6.5 - 8.1 g/dL   Albumin 3.0 (L) 3.5 - 5.0 g/dL   AST 17 15 - 41 U/L   ALT 26 0 - 44 U/L   Alkaline Phosphatase 67 38 - 126 U/L   Total Bilirubin 0.3 0.0 - 1.2 mg/dL   GFR, Estimated >95 >62 mL/min   Anion gap 9 5 - 15  CBC with Differential/Platelet   Collection Time: 05/04/23  6:39 AM  Result Value Ref Range   WBC 7.9 4.0 - 10.5 K/uL   RBC 5.17 4.22 - 5.81 MIL/uL   Hemoglobin 15.9 13.0 - 17.0 g/dL   HCT 13.0 86.5 - 78.4 %   MCV 89.4 80.0 - 100.0 fL   MCH 30.8 26.0 - 34.0 pg   MCHC 34.4 30.0 - 36.0 g/dL   RDW 69.6 29.5 - 28.4 %   Platelets 276 150 - 400 K/uL   nRBC 0.0 0.0 - 0.2 %   Neutrophils Relative % 53 %   Neutro Abs 4.2 1.7 - 7.7 K/uL   Lymphocytes Relative 31 %   Lymphs Abs 2.5 0.7 - 4.0 K/uL   Monocytes Relative 11 %   Monocytes Absolute 0.9 0.1 - 1.0 K/uL   Eosinophils Relative 3 %   Eosinophils Absolute 0.3 0.0 - 0.5 K/uL   Basophils Relative 1 %   Basophils Absolute 0.1 0.0 - 0.1 K/uL   Immature Granulocytes 1 %   Abs Immature Granulocytes 0.04  0.00 - 0.07 K/uL  Basic metabolic panel   Collection Time: 05/06/23  5:17 AM  Result Value Ref Range   Sodium 140 135 - 145 mmol/L   Potassium 4.5 3.5 - 5.1 mmol/L   Chloride 105 98 - 111 mmol/L   CO2 27 22 - 32 mmol/L   Glucose, Bld 109 (H) 70 - 99 mg/dL   BUN 13 6 - 20 mg/dL   Creatinine, Ser 1.32 0.61 - 1.24 mg/dL   Calcium 8.9 8.9 - 44.0 mg/dL   GFR, Estimated >10 >27 mL/min   Anion gap 8 5 - 15  CBC   Collection Time: 05/06/23  5:17 AM  Result Value Ref Range   WBC 7.9 4.0 - 10.5 K/uL   RBC 5.45 4.22 - 5.81 MIL/uL   Hemoglobin 16.6 13.0 - 17.0 g/dL   HCT 25.3 66.4 - 40.3 %   MCV 91.2 80.0 - 100.0 fL   MCH 30.5 26.0 - 34.0 pg   MCHC 33.4 30.0 - 36.0 g/dL   RDW 47.4 25.9 - 56.3 %   Platelets 310 150 - 400 K/uL   nRBC 0.0 0.0 - 0.2 %      Assessment & Plan:   Problem List Items Addressed This Visit     Generalized anxiety disorder   Relevant Medications   busPIRone (BUSPAR) 5 MG tablet   escitalopram (LEXAPRO) 5 MG tablet   History of cerebrovascular accident (CVA) with residual deficit - Primary     History of CVA / Ischemic Stroke (Left Lateral Medullary Infarct) with residual deficit Experienced 1st episode CVA recently - ischemic stroke with left lateral medullary infarct. MRI confirmed infarct; CT unremarkable. No significant arterial or cardiac abnormalities. Etiology unclear. Initiated on dual antiplatelet therapy. Cholesterol levels low; no statin needed. Blood tests negative for clotting disorder.  On Zio patch monitoring cardiac rhythm. - Continue Plavix (clopidogrel) 75 mg  daily until 05/31/2023. - Continue aspirin 81 mg daily indefinitely. - Send Zio patch back on 05/31/2023 for analysis. - Attend cardiology appointment on 06/12/2023. - Attend neurology appointment on 07/02/2023.  Rehabilitation Post-Stroke Completed inpatient rehab course Undergoing rehabilitation with moderate assistance initially. Improved range of motion in left leg. Experiences fatigue  and lightheadedness with exertion. Advised caution to prevent dizziness and balance loss. - Attend physical medicine specialist appointment on 05/22/2023. Anticipate continued future physical therapy  Anxiety and Depression Experiencing anxiety and depression, likely exacerbated by stroke. Previously used marijuana for anxiety relief. Desires to continue medication to reduce urge to smoke and consume alcohol. - Improved on Buspar and Escitalopram SSRI Re order Buspar 5 TWICE A DAY and Lexapro 5mg  daily x 90 day - Follow up in 3 months to reassess anxiety and depression management.  General Health Maintenance Advised to avoid smoking and alcohol due to stroke risk. Reports decreased urge to smoke or consume alcohol. - Encourage continued abstinence from smoking and alcohol.        Meds ordered this encounter  Medications   busPIRone (BUSPAR) 5 MG tablet    Sig: Take 1 tablet (5 mg total) by mouth 2 (two) times daily.    Dispense:  180 tablet    Refill:  1   escitalopram (LEXAPRO) 5 MG tablet    Sig: Take 1 tablet (5 mg total) by mouth daily.    Dispense:  90 tablet    Refill:  1    Follow up plan: Return in about 3 months (around 08/15/2023) for 3 month follow-up Anxiety, History CVA updates specialist.   Saralyn Pilar, DO [facility] 05/15/2023, 3:05 PM

## 2023-05-15 NOTE — Patient Instructions (Addendum)
 Thank you for coming to the office today.  Last dose Plavix (Clopidogrel) 75mg  is 05/31/23 [facility] your bottle) - then just take Aspirin Enteric Coated (EC) baby dose 81mg  daily ONLY every day.  Refilled Escitalopram 5mg  daily and Buspar 5mg  twice a day - 90 day + 1 refill   Please schedule a Follow-up Appointment to: Return in about 3 months (around 08/15/2023) for 3 month follow-up Anxiety, History CVA updates specialist.  If you have any other questions or concerns, please feel free to call the office or send a message through MyChart. You may also schedule an earlier appointment if necessary.  Additionally, you may be receiving a survey about your experience at our office within a few days to 1 week by e-mail or mail. We value your feedback.  Saralyn Pilar, DO [facility], [place]

## 2023-05-22 ENCOUNTER — Encounter: Payer: Self-pay | Attending: Registered Nurse | Admitting: Registered Nurse

## 2023-05-22 ENCOUNTER — Encounter: Payer: Self-pay | Admitting: Registered Nurse

## 2023-05-22 VITALS — BP 120/78 | HR 72 | Ht 67.0 in | Wt 192.0 lb

## 2023-05-22 DIAGNOSIS — I639 Cerebral infarction, unspecified: Secondary | ICD-10-CM

## 2023-05-22 DIAGNOSIS — G8929 Other chronic pain: Secondary | ICD-10-CM | POA: Diagnosis not present

## 2023-05-22 DIAGNOSIS — M25562 Pain in left knee: Secondary | ICD-10-CM | POA: Diagnosis not present

## 2023-05-22 DIAGNOSIS — M792 Neuralgia and neuritis, unspecified: Secondary | ICD-10-CM | POA: Diagnosis not present

## 2023-05-22 DIAGNOSIS — M4723 Other spondylosis with radiculopathy, cervicothoracic region: Secondary | ICD-10-CM | POA: Diagnosis not present

## 2023-05-22 MED ORDER — GABAPENTIN 100 MG PO CAPS: 100.0000 mg | ORAL_CAPSULE | Freq: Every day | ORAL | 1 refills | Status: DC

## 2023-05-22 NOTE — Progress Notes (Signed)
 Subjective:    Patient ID: Charles Benitez, male    DOB: 15-Sep-1975, 48 y.o.   MRN: [id number]  HPI: Charles Benitez is a 48 y.o. male who is here for HFU o f his  Acute CVA, Neuropathic Pain o Right Hand and complaints o left knee pain or the last three months. He presented to ED on 05/02/2023 with complaints o left sided headache with pain behind left eye, left facial numbness, paresthesia o both hands and unsteady gait.   Dr. Para March : H&P: 05/02/2023 Chief Complaint:     Chief Complaint  Patient presents with  . Numbness      HPI: Charles Benitez is a 48 y.o. male with medical history significant for Chronic low back pain with sciatica and family history of premature CAD and stroke with no other significant past medical history who presented to the ED with left-sided headache with pain behind left eye, left facial numbness, paresthesia of both hands and unsteady gait, with onset of symptoms at 2:30 PM on 05/01/2023.  He was previously in his usual state of health.  He arrived to the ED after 5 PM just outside tPA window.  Symptoms have improved since arrival. ED course and data review: Mildly bradycardic to the high 50s with otherwise normal vitals..  EKG, personally viewed and interpreted showing NSR at 66 with no acute ST-T wave changes. Head CT showed no acute findings   Patient was evaluated by teleneurology with recommendation for several MRI studies.   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  There was no LVO and MRA neck without hemodynamically significant stenosis   Patient treated with full dose aspirin and also given a dose of meclizine and NS bolus   Hospitalist consulted for admission for stroke workup Labs with mild leukocytosis of 13,000 otherwise mostly unremarkable    CT Head:  WO Contrast: IMPRESSION: No acute intracranial pathology.  MR: Head WO  Contrast: MRA: MR: Neck:  MPRESSION: MRI HEAD:   1. 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 2. Otherwise normal brain MRI for age.   MRA HEAD:   Normal intracranial MRA. No large vessel occlusion, hemodynamically significant stenosis, or other acute vascular abnormality.   MRA NECK:   1. Motion degraded exam. 2. Grossly negative MRA of the neck. No hemodynamically significant stenosis or other acute vascular abnormality.  Neurology was consulted: DAPT x 21 days  then followed by aspirin.   Charles Benitez: Wearing Zio Patch: Has a scheduled appointment with Cardiology: on 06/12/2023.   Charles Benitez was admitted to inpatient Rehabilitation on 05/03/2023 and discharged home on 05/10/2023. Physical Therapy referral was placed today, to [facility], patient's preference, he lives in [place].   He states he has pain in left knee which has increased in intensity and frequency, for the last three months, X-ray order placed. He verbalizes understanding.   Pain Inventory Average Pain 3 Pain Right Now 3 My pain is  stiffness, tightness  LOCATION OF PAIN  back and abdomen   BOWEL Number of stools per week: 5-6 times Oral laxative use Yes  Type of laxative Senokot Enema or suppository use No  History of colostomy No  Incontinent No   BLADDER Normal In and out cath, frequency N/A Able to self cath  N/A Bladder incontinence No  Frequent urination No  Leakage with coughing No  Difficulty starting stream No  Incomplete bladder emptying No    Mobility walk without assistance do you drive?  yes but haven't drove since having the stroke   Function not employed: date last employed pt states he hasn't worked in 4-5 months ago  Neuro/Psych weakness numbness tingling trouble walking spasms dizziness confusion depression anxiety  Prior Studies [employer] involved in your care [facility], [facility], [facility]  Appointment    No family history on file. Social History   Socioeconomic History  . Marital status: Single    Spouse name: Not on file  . Number of children: Not on file  . Years of education: Not on file  . Highest education level: Not on file  Occupational History  . Not on file  Tobacco Use  . Smoking status: Every Day    Current packs/day: 0.66    Types: Cigarettes  . Smokeless tobacco: Never  Substance and Sexual Activity  . Alcohol use: Yes    Comment: 2-3 drinks on the weekends   . Drug use: Yes    Types: Marijuana  . Sexual activity: Never  Other Topics Concern  . Not on file  Social History Narrative  . Not on file   Social Drivers of Health   Financial Resource Strain: Not on file  Food Insecurity: No Food Insecurity (05/02/2023)   Hunger Vital Sign   . Worried About Programme researcher, broadcasting/film/video in the Last Year: Never true   . Ran Out of Food in the Last Year: Never true  Transportation Needs: No Transportation Needs (05/02/2023)   PRAPARE - Transportation   . Lack of Transportation (Medical): No   . Lack of Transportation (Non-Medical): No  Physical Activity: Not on file  Stress: Not on file  Social Connections: Unknown (05/02/2023)   Social Connection and Isolation Panel [NHANES]   . Frequency of Communication with Friends and Family: More than three times a week   . Frequency of Social Gatherings with Friends and Family: More than three times a week   . Attends Religious Services: 1 to 4 times per year   . Active Member of Clubs or Organizations: No   . Attends Banker Meetings: 1 to 4 times per year   . Marital Status: Not on file   No past surgical history on file. Past Medical History:  Diagnosis Date  . Asthma   . Chronic back pain    There were no vitals taken for this visit.  Opioid Risk Score:   Fall Risk Score:  `1  Depression screen PHQ 2/9     05/15/2023    2:25 PM 07/20/2020    1:48 PM 03/18/2020   10:06 AM  Depression  screen PHQ 2/9  Decreased Interest 3 0 0  Down, Depressed, Hopeless 3 0 0  PHQ - 2 Score 6 0 0  Altered sleeping 3 0   Tired, decreased energy 3 0   Change in appetite 3 0   Feeling bad or failure about yourself  3 0   Trouble concentrating 3 0   Moving slowly or fidgety/restless 2 0   Suicidal thoughts 0 0   PHQ-9 Score 23 0   Difficult doing work/chores Very difficult Not difficult at all     Review of Systems  Gastrointestinal:  Positive for abdominal pain.  Musculoskeletal:  Positive for back pain.       Objective:   Physical Exam Vitals and nursing note reviewed.  Constitutional:  Appearance: Normal appearance.  Cardiovascular:     Rate and Rhythm: Normal rate and regular rhythm.     Pulses: Normal pulses.     Heart sounds: Normal heart sounds.  Pulmonary:     Effort: Pulmonary effort is normal.     Breath sounds: Normal breath sounds.  Musculoskeletal:     Comments: Normal Muscle Bulk and Muscle Testing Reveals:  Upper Extremities: Full ROM and Muscle Strength  5/5  Lower Extremities : Full ROM and muscle Strength 5/5 Arises from Table with ease Narrow Based Gait     Skin:    General: Skin is warm and dry.  Neurological:     Mental Status: He is alert and oriented to person, place, and time.  Psychiatric:        Mood and Affect: Mood normal.        Behavior: Behavior normal.          Assessment & Plan:  Acute CVA: He has a scheduled appointment with Neurology. Continue current medication regimen. PT: Referral Placed Today: [facility]. Continue to Monitor. Charles Benitez wearing Zio Patch, has a scheduled appointment with Neurology, he reports. .  Neuropathic Pain: Continue Gabapentin. Continue to Monitor.  Chronic Pain of Left Knee: RX: X-ray. Continue to Monitor.   F/U with Dr Wynn Banker in 4- 6 weeks

## 2023-06-03 DIAGNOSIS — I639 Cerebral infarction, unspecified: Secondary | ICD-10-CM | POA: Diagnosis not present

## 2023-06-11 DIAGNOSIS — I639 Cerebral infarction, unspecified: Secondary | ICD-10-CM | POA: Diagnosis not present

## 2023-06-19 NOTE — Progress Notes (Deleted)
 Subjective:    Patient ID: Charles Benitez, male    DOB: 10-05-1975, 48 y.o.   MRN: [id number]  HPI  Pain Inventory Average Pain {NUMBERS; 0-10:5044} Pain Right Now {NUMBERS; 0-10:5044} My pain is {PAIN DESCRIPTION:21022940}  In the last 24 hours, has pain interfered with the following? General activity {NUMBERS; 0-10:5044} Relation with others {NUMBERS; 0-10:5044} Enjoyment of life {NUMBERS; 0-10:5044} What TIME of day is your pain at its worst? {time of day:24191} Sleep (in general) {BHH GOOD/FAIR/POOR:22877}  Pain is worse with: {ACTIVITIES:21022942} Pain improves with: {PAIN IMPROVES NWGN:56213086} Relief from Meds: {NUMBERS; 0-10:5044}  No family history on file. Social History   Socioeconomic History   Marital status: Single    Spouse name: Not on file   Number of children: Not on file   Years of education: Not on file   Highest education level: Not on file  Occupational History   Not on file  Tobacco Use   Smoking status: Every Day    Current packs/day: 0.66    Types: Cigarettes   Smokeless tobacco: Never  Substance and Sexual Activity   Alcohol use: Yes    Comment: 2-3 drinks on the weekends    Drug use: Yes    Types: Marijuana   Sexual activity: Never  Other Topics Concern   Not on file  Social History Narrative   Not on file   Social Drivers of Health   Financial Resource Strain: Not on file  Food Insecurity: No Food Insecurity (05/02/2023)   Hunger Vital Sign    Worried About Running Out of Food in the Last Year: Never true    Ran Out of Food in the Last Year: Never true  Transportation Needs: No Transportation Needs (05/02/2023)   PRAPARE - Administrator, Civil Service (Medical): No    Lack of Transportation (Non-Medical): No  Physical Activity: Not on file  Stress: Not on file  Social Connections: Unknown (05/02/2023)   Social Connection and Isolation Panel [NHANES]    Frequency of Communication with Friends and Family: More  than three times a week    Frequency of Social Gatherings with Friends and Family: More than three times a week    Attends Religious Services: 1 to 4 times per year    Active Member of [employer] or Organizations: No    Attends Banker Meetings: 1 to 4 times per year    Marital Status: Not on file   No past surgical history on file. No past surgical history on file. Past Medical History:  Diagnosis Date   Asthma    Chronic back pain    There were no vitals taken for this visit.  Opioid Risk Score:   Fall Risk Score:  `1  Depression screen PHQ 2/9     05/22/2023    2:26 PM 05/15/2023    2:25 PM 07/20/2020    1:48 PM 03/18/2020   10:06 AM  Depression screen PHQ 2/9  Decreased Interest 3 3 0 0  Down, Depressed, Hopeless 3 3 0 0  PHQ - 2 Score 6 6 0 0  Altered sleeping  3 0   Tired, decreased energy  3 0   Change in appetite  3 0   Feeling bad or failure about yourself   3 0   Trouble concentrating  3 0   Moving slowly or fidgety/restless  2 0   Suicidal thoughts  0 0   PHQ-9 Score  23 0   Difficult doing  work/chores  Very difficult Not difficult at all      Review of Systems     Objective:   Physical Exam        Assessment & Plan:

## 2023-06-20 ENCOUNTER — Encounter: Attending: Physical Medicine & Rehabilitation | Admitting: Physical Medicine & Rehabilitation

## 2023-06-20 DIAGNOSIS — M4723 Other spondylosis with radiculopathy, cervicothoracic region: Secondary | ICD-10-CM | POA: Insufficient documentation

## 2023-06-20 DIAGNOSIS — M25562 Pain in left knee: Secondary | ICD-10-CM | POA: Insufficient documentation

## 2023-06-20 DIAGNOSIS — M792 Neuralgia and neuritis, unspecified: Secondary | ICD-10-CM | POA: Insufficient documentation

## 2023-06-20 DIAGNOSIS — G8929 Other chronic pain: Secondary | ICD-10-CM | POA: Insufficient documentation

## 2023-06-20 DIAGNOSIS — I639 Cerebral infarction, unspecified: Secondary | ICD-10-CM | POA: Insufficient documentation

## 2023-07-02 DIAGNOSIS — R200 Anesthesia of skin: Secondary | ICD-10-CM | POA: Diagnosis not present

## 2023-07-02 DIAGNOSIS — R531 Weakness: Secondary | ICD-10-CM | POA: Diagnosis not present

## 2023-07-02 DIAGNOSIS — G479 Sleep disorder, unspecified: Secondary | ICD-10-CM | POA: Diagnosis not present

## 2023-07-02 DIAGNOSIS — R2689 Other abnormalities of gait and mobility: Secondary | ICD-10-CM | POA: Diagnosis not present

## 2023-07-02 DIAGNOSIS — R202 Paresthesia of skin: Secondary | ICD-10-CM | POA: Diagnosis not present

## 2023-07-02 DIAGNOSIS — R519 Headache, unspecified: Secondary | ICD-10-CM | POA: Diagnosis not present

## 2023-07-02 DIAGNOSIS — I639 Cerebral infarction, unspecified: Secondary | ICD-10-CM | POA: Diagnosis not present

## 2023-08-20 ENCOUNTER — Encounter: Payer: Self-pay | Admitting: Family Medicine

## 2023-08-20 ENCOUNTER — Ambulatory Visit: Admitting: Family Medicine

## 2023-08-20 VITALS — BP 138/84 | HR 62 | Ht 67.0 in | Wt 202.0 lb

## 2023-08-20 DIAGNOSIS — M5442 Lumbago with sciatica, left side: Secondary | ICD-10-CM | POA: Diagnosis not present

## 2023-08-20 DIAGNOSIS — M5441 Lumbago with sciatica, right side: Secondary | ICD-10-CM

## 2023-08-20 DIAGNOSIS — F411 Generalized anxiety disorder: Secondary | ICD-10-CM

## 2023-08-20 DIAGNOSIS — M542 Cervicalgia: Secondary | ICD-10-CM | POA: Diagnosis not present

## 2023-08-20 DIAGNOSIS — M4723 Other spondylosis with radiculopathy, cervicothoracic region: Secondary | ICD-10-CM

## 2023-08-20 DIAGNOSIS — I6930 Unspecified sequelae of cerebral infarction: Secondary | ICD-10-CM

## 2023-08-20 DIAGNOSIS — G8929 Other chronic pain: Secondary | ICD-10-CM

## 2023-08-20 MED ORDER — TRAMADOL HCL 50 MG PO TABS
50.0000 mg | ORAL_TABLET | Freq: Four times a day (QID) | ORAL | 0 refills | Status: AC | PRN
Start: 2023-08-20 — End: 2023-08-25

## 2023-08-20 MED ORDER — NORTRIPTYLINE HCL 10 MG PO CAPS: 10.0000 mg | ORAL_CAPSULE | Freq: Every day | ORAL | 2 refills | Status: AC

## 2023-08-20 MED ORDER — GABAPENTIN 300 MG PO CAPS
300.0000 mg | ORAL_CAPSULE | Freq: Every day | ORAL | 1 refills | Status: AC
Start: 2023-08-20 — End: ?

## 2023-08-20 MED ORDER — KETOROLAC TROMETHAMINE 30 MG/ML IJ SOLN
30.0000 mg | Freq: Once | INTRAMUSCULAR | 0 refills | Status: DC
Start: 2023-08-20 — End: 2023-08-20

## 2023-08-20 MED ORDER — BUSPIRONE HCL 5 MG PO TABS: 5.0000 mg | ORAL_TABLET | Freq: Two times a day (BID) | ORAL | 1 refills | Status: AC

## 2023-08-20 MED ORDER — ESCITALOPRAM OXALATE 5 MG PO TABS: 5.0000 mg | ORAL_TABLET | Freq: Every day | ORAL | 1 refills | Status: AC

## 2023-08-20 MED ORDER — KETOROLAC TROMETHAMINE 30 MG/ML IJ SOLN
30.0000 mg | Freq: Once | INTRAMUSCULAR | Status: AC
  Administered 2023-08-20: 30 mg via INTRAMUSCULAR

## 2023-08-20 MED ORDER — CYCLOBENZAPRINE HCL 10 MG PO TABS: 10.0000 mg | ORAL_TABLET | Freq: Three times a day (TID) | ORAL | 1 refills | Status: AC | PRN

## 2023-08-20 NOTE — Patient Instructions (Addendum)
 Thank you for coming to the office today.  Try the Nervive nerve supplement, or can consider Alpha Lipoic Acid 600mg  3 times per day for nerve health supplement.  For back and neck pain, Toradol  injection today - Do not take meloxicam or ibuprofen  for next few days.  Start Cyclobenzapine (Flexeril ) 10mg  tablets (muscle relaxant) - start with half (cut) to one whole pill at night for muscle relaxant - may make you sedated or sleepy (be careful driving or working on this) if tolerated you can take half to whole tab 2 to 3 times daily or every 8 hours as needed  Tramadol  as needed for pain short term.  Refilled Lexapro  and Buspar   Dose increased Gabapentin  from 100 nightly to 300mg  nightly  I ordered the Nortriptyline from Dr Walden Guise - 10mg  nightly for 1 week then increase to 2 pills = 60mg  nightly until you see him back.  Please schedule a Follow-up Appointment to: Return in about 3 months (around 11/20/2023) for 3 month follow-up Neuro, back pain, updates.  If you have any other questions or concerns, please feel free to call the office or send a message through MyChart. You may also schedule an earlier appointment if necessary.  Additionally, you may be receiving a survey about your experience at our office within a few days to 1 week by e-mail or mail. We value your feedback.  Domingo Friend, DO [facility], [place]

## 2023-08-20 NOTE — Progress Notes (Signed)
 Subjective:    Patient ID: Charles Benitez, male    DOB: 12/21/1975, 48 y.o.   MRN: [id number]  Charles Benitez is a 48 y.o. male presenting on 08/20/2023 for Back Pain and Neck Pain   HPI  Discussed the use of AI scribe software for clinical note transcription with the patient, who gave verbal consent to proceed.  History of Present Illness   Charles Benitez is a 48 year old male with chronic back pain and a history of stroke who presents with acute back pain and locked back.  Anxiety He takes Buspar  twice daily for anxiety, which helps keep him calm. He has not smoked or consumed alcohol for four months. He finds it difficult to perform work-related tasks due to fatigue, dizziness, and sensitivity to heat and light, which he attributes to his stroke.  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 He has history of muscle spasms of back. He has occasional episodes of back locking and causes significant pain and worse with movement. He can get some other muscle cramps even in lower legs at times.  He has been experiencing acute back pain with a sensation of being 'stuck' or 'locked' for the past three days. The pain affects his entire back, including his shoulders, neck, and spine, and occurs three to four times a year. No recent heavy lifting or specific injury has triggered this episode.  He has a history of chronic back pain and has been treated with Tylenol , anti-inflammatories, and muscle relaxants. A Toradol  injection in 2018 provided rapid relief. Currently, he uses Tylenol  and rice for pain management and takes gabapentin  100 mg nightly, with only one capsule left. Gabapentin  was initially prescribed for pain and nerve pain related to his stroke.  Additional symptoms include a sensation of a knot in his back, soreness to touch, and numbness in his arm and leg. Residual  symptoms from a previous stroke include 'head rush', heat and cold sensations, and fatigue. His leg feels 'funny', and he has difficulty working due to these symptoms.  - History in past ED would give him Tramadol , Flexeril , Ibuprofen  - he has been to ED acutely several times in the past few years. He has received Toradol  in past 30mg  injection in ED. - Taking NSAID Ibuprofen  AS NEEDED and occasional meloxicam, no longer active Rx   History of CVA with residual symptoms He mentions cervical spine issues at C6, with neck and head pain. He feels the need to keep his head down and reports sweating on one side of his head and sensitivity to heat and light, associating these with his stroke.  He has been prescribed nortriptyline for stroke-related nerve symptoms but has not started it.   Still has residual sensation difficulties following stroke with cold and numb tingling Low energy, fatigue, tired Cannot work at this time Sensitivity to light and heat      08/20/2023    9:23 AM 05/22/2023    2:26 PM 05/15/2023    2:25 PM  Depression screen PHQ 2/9  Decreased Interest 1 3 3   Down, Depressed, Hopeless 0 3 3  PHQ - 2 Score 1 6 6   Altered sleeping 3  3  Tired, decreased energy 3  3  Change in appetite 3  3  Feeling bad or failure about yourself  0  3  Trouble concentrating 1  3  Moving slowly or fidgety/restless  3  2  Suicidal thoughts 0  0  PHQ-9 Score 14  23  Difficult doing work/chores Very difficult  Very difficult       08/20/2023    9:23 AM 05/15/2023    2:25 PM 07/20/2020    1:48 PM  GAD 7 : Generalized Anxiety Score  Nervous, Anxious, on Edge 3 3 0  Control/stop worrying 3 3 0  Worry too much - different things 3 3 0  Trouble relaxing 3 3 0  Restless 2 3 0  Easily annoyed or irritable 2 3 0  Afraid - awful might happen 1 3 0  Total GAD 7 Score 17 21 0  Anxiety Difficulty Somewhat difficult Very difficult Not difficult at all    Social History   Tobacco Use    Smoking status: Every Day    Current packs/day: 0.66    Types: Cigarettes   Smokeless tobacco: Never  Substance Use Topics   Alcohol use: Yes    Comment: 2-3 drinks on the weekends    Drug use: Yes    Types: Marijuana    Review of Systems Per HPI unless specifically indicated above     Objective:    BP 138/84 (BP Location: Left Arm, Cuff Size: Normal)   Pulse 62   Ht 5' 7 (1.702 m)   Wt 202 lb (91.6 kg)   SpO2 97%   BMI 31.64 kg/m   Wt Readings from Last 3 Encounters:  08/20/23 202 lb (91.6 kg)  05/22/23 192 lb (87.1 kg)  05/15/23 178 lb (80.7 kg)    Physical Exam Vitals and nursing note reviewed.  Constitutional:      General: He is not in acute distress.    Appearance: He is well-developed. He is not diaphoretic.     Comments: Well-appearing, uncomfortable back pain spasms, cooperative  HENT:     Head: Normocephalic and atraumatic.   Eyes:     General:        Right eye: No discharge.        Left eye: No discharge.     Conjunctiva/sclera: Conjunctivae normal.   Neck:     Thyroid: No thyromegaly.   Cardiovascular:     Rate and Rhythm: Normal rate and regular rhythm.     Pulses: Normal pulses.     Heart sounds: Normal heart sounds. No murmur heard. Pulmonary:     Effort: Pulmonary effort is normal. No respiratory distress.     Breath sounds: Normal breath sounds. No wheezing or rales.   Musculoskeletal:        General: Normal range of motion.     Cervical back: Normal range of motion and neck supple.     Comments: Upper and lower back paraspinal with hypertonicity muscle spasms  Lymphadenopathy:     Cervical: No cervical adenopathy.   Skin:    General: Skin is warm and dry.     Findings: No erythema or rash.   Neurological:     Mental Status: He is alert and oriented to person, place, and time. Mental status is at baseline.   Psychiatric:        Behavior: Behavior normal.     Comments: Well groomed, good eye contact, normal speech and thoughts      Results for orders placed or performed during the hospital encounter of 05/03/23  Comprehensive metabolic panel   Collection Time: 05/04/23  6:39 AM  Result Value Ref Range   Sodium 140 135 - 145 mmol/L  Potassium 4.5 3.5 - 5.1 mmol/L   Chloride 106 98 - 111 mmol/L   CO2 25 22 - 32 mmol/L   Glucose, Bld 116 (H) 70 - 99 mg/dL   BUN 14 6 - 20 mg/dL   Creatinine, Ser 9.14 0.61 - 1.24 mg/dL   Calcium 8.6 (L) 8.9 - 10.3 mg/dL   Total Protein 5.6 (L) 6.5 - 8.1 g/dL   Albumin 3.0 (L) 3.5 - 5.0 g/dL   AST 17 15 - 41 U/L   ALT 26 0 - 44 U/L   Alkaline Phosphatase 67 38 - 126 U/L   Total Bilirubin 0.3 0.0 - 1.2 mg/dL   GFR, Estimated >78 >29 mL/min   Anion gap 9 5 - 15  CBC with Differential/Platelet   Collection Time: 05/04/23  6:39 AM  Result Value Ref Range   WBC 7.9 4.0 - 10.5 K/uL   RBC 5.17 4.22 - 5.81 MIL/uL   Hemoglobin 15.9 13.0 - 17.0 g/dL   HCT 56.2 13.0 - 86.5 %   MCV 89.4 80.0 - 100.0 fL   MCH 30.8 26.0 - 34.0 pg   MCHC 34.4 30.0 - 36.0 g/dL   RDW 78.4 69.6 - 29.5 %   Platelets 276 150 - 400 K/uL   nRBC 0.0 0.0 - 0.2 %   Neutrophils Relative % 53 %   Neutro Abs 4.2 1.7 - 7.7 K/uL   Lymphocytes Relative 31 %   Lymphs Abs 2.5 0.7 - 4.0 K/uL   Monocytes Relative 11 %   Monocytes Absolute 0.9 0.1 - 1.0 K/uL   Eosinophils Relative 3 %   Eosinophils Absolute 0.3 0.0 - 0.5 K/uL   Basophils Relative 1 %   Basophils Absolute 0.1 0.0 - 0.1 K/uL   Immature Granulocytes 1 %   Abs Immature Granulocytes 0.04 0.00 - 0.07 K/uL  Basic metabolic panel   Collection Time: 05/06/23  5:17 AM  Result Value Ref Range   Sodium 140 135 - 145 mmol/L   Potassium 4.5 3.5 - 5.1 mmol/L   Chloride 105 98 - 111 mmol/L   CO2 27 22 - 32 mmol/L   Glucose, Bld 109 (H) 70 - 99 mg/dL   BUN 13 6 - 20 mg/dL   Creatinine, Ser 2.84 0.61 - 1.24 mg/dL   Calcium 8.9 8.9 - 13.2 mg/dL   GFR, Estimated >44 >01 mL/min   Anion gap 8 5 - 15  CBC   Collection Time: 05/06/23  5:17 AM  Result Value  Ref Range   WBC 7.9 4.0 - 10.5 K/uL   RBC 5.45 4.22 - 5.81 MIL/uL   Hemoglobin 16.6 13.0 - 17.0 g/dL   HCT 02.7 25.3 - 66.4 %   MCV 91.2 80.0 - 100.0 fL   MCH 30.5 26.0 - 34.0 pg   MCHC 33.4 30.0 - 36.0 g/dL   RDW 40.3 47.4 - 25.9 %   Platelets 310 150 - 400 K/uL   nRBC 0.0 0.0 - 0.2 %      Assessment & Plan:   Problem List Items Addressed This Visit     Chronic bilateral low back pain with bilateral sciatica   Relevant Medications   escitalopram  (LEXAPRO ) 5 MG tablet   gabapentin  (NEURONTIN ) 300 MG capsule   cyclobenzaprine  (FLEXERIL ) 10 MG tablet   traMADol  (ULTRAM ) 50 MG tablet   busPIRone  (BUSPAR ) 5 MG tablet   nortriptyline (PAMELOR) 10 MG capsule   Chronic neck pain   Relevant Medications   escitalopram  (LEXAPRO ) 5 MG tablet  gabapentin  (NEURONTIN ) 300 MG capsule   cyclobenzaprine  (FLEXERIL ) 10 MG tablet   traMADol  (ULTRAM ) 50 MG tablet   nortriptyline (PAMELOR) 10 MG capsule   Generalized anxiety disorder   Relevant Medications   escitalopram  (LEXAPRO ) 5 MG tablet   busPIRone  (BUSPAR ) 5 MG tablet   nortriptyline (PAMELOR) 10 MG capsule   History of cerebrovascular accident (CVA) with residual deficit   Relevant Medications   nortriptyline (PAMELOR) 10 MG capsule   Osteoarthritis of spine with radiculopathy, cervicothoracic region - Primary   Relevant Medications   escitalopram  (LEXAPRO ) 5 MG tablet   gabapentin  (NEURONTIN ) 300 MG capsule   cyclobenzaprine  (FLEXERIL ) 10 MG tablet   traMADol  (ULTRAM ) 50 MG tablet   busPIRone  (BUSPAR ) 5 MG tablet   nortriptyline (PAMELOR) 10 MG capsule     Chronic Back and Neck Pain / Back Spasms Cervical DDD / Osteoarthritis Chronic back pain with acute exacerbation. C6 cervical spine deterioration contributes to neck pain. Previous treatments included acetaminophen , rest, ice, compression, and anti-inflammatories. Toradol  injection considered for immediate relief. Muscle relaxants and increased gabapentin  planned. Meloxicam  paused to avoid interaction with Toradol .  - Administer Toradol  30mg  injection for immediate pain relief. - Prescribe Flexeril  for muscle relaxation. AS NEEDED  - Prescribe tramadol  for pain management, as needed. Short term only. - Increase gabapentin  dosage from 100 mg to 300 mg nightly for nerve pain. - Pause meloxicam while using Toradol  and other medications. No longer active rx. We can re order NSAID if need in future.  History of CVA / Residual Symptoms Residual symptoms include nerve pain, temperature and sensation changes, fatigue, and sweating.  Now established with Neurology at [facility] recently Nortriptyline prescribed for nerve symptoms. Alpha-lipoic acid recommended for nerve support.  Note pharmacy never received order from Neuro. Never started med 6 week ago - Prescribe nortriptyline 10 mg nightly for one week, then increase to 20 mg nightly for nerve symptoms. - Recommend alpha-lipoic acid supplement for nerve support. - Continue gabapentin  with increased dosage for nerve pain management.  Anxiety Anxiety managed with Buspar . Positive lifestyle changes noted with cessation of smoking and alcohol. - Continue Buspar  as prescribed. - Continue Escitalopram  5mg  daily  General Health Maintenance Significant lifestyle changes include cessation of smoking and alcohol. - Encourage continuation of current lifestyle changes.  Follow-up Follow-up necessary to monitor treatment effectiveness and adjust as needed. Advised to discuss disability application with neurologist. Advised him that I would not be able to make a disability assessment. My role is to provide his clinical medical information. I advised it is less likely to get disability < 1 year after CVA since there has not been time to allow for recovery and improvement. He can discuss with Neurology further - Schedule follow-up appointment in September to assess symptom management and treatment efficacy.        No orders  of the defined types were placed in this encounter.   Meds ordered this encounter  Medications   escitalopram  (LEXAPRO ) 5 MG tablet    Sig: Take 1 tablet (5 mg total) by mouth daily.    Dispense:  90 tablet    Refill:  1   gabapentin  (NEURONTIN ) 300 MG capsule    Sig: Take 1 capsule (300 mg total) by mouth at bedtime.    Dispense:  90 capsule    Refill:  1    Discontinue 100mg  dose, increase to 300mg  nightly   DISCONTD: ketorolac  (TORADOL ) 30 MG/ML injection    Sig: Inject 1 mL (30 mg  total) into the vein once for 1 dose.    Dispense:  1 mL    Refill:  0   cyclobenzaprine  (FLEXERIL ) 10 MG tablet    Sig: Take 1 tablet (10 mg total) by mouth 3 (three) times daily as needed for muscle spasms.    Dispense:  90 tablet    Refill:  1   traMADol  (ULTRAM ) 50 MG tablet    Sig: Take 1 tablet (50 mg total) by mouth every 6 (six) hours as needed for up to 5 days for moderate pain (pain score 4-6).    Dispense:  20 tablet    Refill:  0   busPIRone  (BUSPAR ) 5 MG tablet    Sig: Take 1 tablet (5 mg total) by mouth 2 (two) times daily.    Dispense:  180 tablet    Refill:  1   nortriptyline (PAMELOR) 10 MG capsule    Sig: Take 1 capsule (10 mg total) by mouth at bedtime. After 1 week, dose increase to 2 pills (20mg ) nightly    Dispense:  60 capsule    Refill:  2   ketorolac  (TORADOL ) 30 MG/ML injection 30 mg    Follow up plan: Return in about 3 months (around 11/20/2023) for 3 month follow-up Neuro, back pain, updates.    Domingo Friend, DO [facility] 08/20/2023, 9:38 AM

## 2023-09-30 DIAGNOSIS — F329 Major depressive disorder, single episode, unspecified: Secondary | ICD-10-CM | POA: Diagnosis not present

## 2023-09-30 DIAGNOSIS — R2689 Other abnormalities of gait and mobility: Secondary | ICD-10-CM | POA: Diagnosis not present

## 2023-09-30 DIAGNOSIS — R42 Dizziness and giddiness: Secondary | ICD-10-CM | POA: Diagnosis not present

## 2023-09-30 DIAGNOSIS — R531 Weakness: Secondary | ICD-10-CM | POA: Diagnosis not present

## 2023-09-30 DIAGNOSIS — R252 Cramp and spasm: Secondary | ICD-10-CM | POA: Diagnosis not present

## 2023-09-30 DIAGNOSIS — R5383 Other fatigue: Secondary | ICD-10-CM | POA: Diagnosis not present

## 2023-09-30 DIAGNOSIS — F419 Anxiety disorder, unspecified: Secondary | ICD-10-CM | POA: Diagnosis not present

## 2023-09-30 DIAGNOSIS — I639 Cerebral infarction, unspecified: Secondary | ICD-10-CM | POA: Diagnosis not present

## 2023-09-30 DIAGNOSIS — R519 Headache, unspecified: Secondary | ICD-10-CM | POA: Diagnosis not present

## 2023-09-30 DIAGNOSIS — Z1331 Encounter for screening for depression: Secondary | ICD-10-CM | POA: Diagnosis not present

## 2023-09-30 DIAGNOSIS — R200 Anesthesia of skin: Secondary | ICD-10-CM | POA: Diagnosis not present

## 2023-09-30 DIAGNOSIS — R202 Paresthesia of skin: Secondary | ICD-10-CM | POA: Diagnosis not present

## 2023-09-30 DIAGNOSIS — G479 Sleep disorder, unspecified: Secondary | ICD-10-CM | POA: Diagnosis not present

## 2023-10-02 DIAGNOSIS — I639 Cerebral infarction, unspecified: Secondary | ICD-10-CM | POA: Diagnosis not present

## 2023-10-02 DIAGNOSIS — F411 Generalized anxiety disorder: Secondary | ICD-10-CM | POA: Diagnosis not present

## 2023-11-20 ENCOUNTER — Encounter: Payer: Self-pay | Admitting: Family Medicine

## 2023-11-20 ENCOUNTER — Ambulatory Visit (INDEPENDENT_AMBULATORY_CARE_PROVIDER_SITE_OTHER): Admitting: Family Medicine

## 2023-11-20 VITALS — BP 108/72 | HR 82 | Ht 67.0 in | Wt 191.4 lb

## 2023-11-20 DIAGNOSIS — F411 Generalized anxiety disorder: Secondary | ICD-10-CM

## 2023-11-20 DIAGNOSIS — M5442 Lumbago with sciatica, left side: Secondary | ICD-10-CM | POA: Diagnosis not present

## 2023-11-20 DIAGNOSIS — I6930 Unspecified sequelae of cerebral infarction: Secondary | ICD-10-CM

## 2023-11-20 DIAGNOSIS — Z23 Encounter for immunization: Secondary | ICD-10-CM | POA: Diagnosis not present

## 2023-11-20 DIAGNOSIS — M5441 Lumbago with sciatica, right side: Secondary | ICD-10-CM

## 2023-11-20 DIAGNOSIS — R42 Dizziness and giddiness: Secondary | ICD-10-CM

## 2023-11-20 DIAGNOSIS — H6993 Unspecified Eustachian tube disorder, bilateral: Secondary | ICD-10-CM

## 2023-11-20 DIAGNOSIS — G8929 Other chronic pain: Secondary | ICD-10-CM

## 2023-11-20 DIAGNOSIS — I69354 Hemiplegia and hemiparesis following cerebral infarction affecting left non-dominant side: Secondary | ICD-10-CM | POA: Insufficient documentation

## 2023-11-20 MED ORDER — FLUTICASONE PROPIONATE 50 MCG/ACT NA SUSP
2.0000 | Freq: Every day | NASAL | 1 refills | Status: AC
Start: 2023-11-20 — End: ?

## 2023-11-20 NOTE — Patient Instructions (Addendum)
 Thank you for coming to the office today.  Flu Shot  Okay to return to work, without restrictions. You are cleared after history of stroke.  Keep on medications, refills at pharmacy. If you are missing any or need new refills, please let us  or the pharmacy know  DUE for FASTING BLOOD WORK (no food or drink after midnight before the lab appointment, only water or coffee without cream/sugar on the morning of)  SCHEDULE Lab Only visit in the morning at the clinic for lab draw in 6 MONTHS   - Make sure Lab Only appointment is at about 1 week before your next appointment, so that results will be available  For Lab Results, once available within 2-3 days of blood draw, you can can log in to MyChart online to view your results and a brief explanation. Also, we can discuss results at next follow-up visit.    Please schedule a Follow-up Appointment to: Return in about 6 months (around 05/19/2024) for 6 month Annual Physical AM fasting labs after.  If you have any other questions or concerns, please feel free to call the office or send a message through MyChart. You may also schedule an earlier appointment if necessary.  Additionally, you may be receiving a survey about your experience at our office within a few days to 1 week by e-mail or mail. We value your feedback.  Marsa Officer, DO [facility] [place], [place]

## 2023-11-20 NOTE — Progress Notes (Signed)
 Subjective:    Patient ID: Charles Benitez, male    DOB: 12/25/1975, 48 y.o.   MRN: [id number]  Charles Benitez is a 48 y.o. male presenting on 11/20/2023 for Medical Management of Chronic Issues and History of Stroke   HPI  Discussed the use of AI scribe software for clinical note transcription with the patient, who gave verbal consent to proceed.  History of Present Illness   Charles Benitez is a 48 year old male with a history of stroke who presents with dizziness and coordination issues.  History of CVA L sided residual hemiparesis upper and lower ext and other residual symptoms Post-stroke neurological symptoms - Primary issue is Left sided arm and leg weakness following stroke still but has had notable improvement. - Dizziness described as 'lightheaded' and 'woozy', particularly with fast movements or quick turns - Symptoms occur when focusing on using leg muscles or coordinating movements - Symptoms have persisted since stroke in February, approximately seven months ago - Improvement in coordination and strength since stroke - Strain and fatigue when concentrating on walking properly - Interested in returning to work but uncertain of full capabilities until attempting  Musculoskeletal pain and arthritis - History of arthritis affecting back and neck - No recent worsening of back or neck pain - No joint locking since previous episode  Eustachian Tube Dysfunction Otolaryngologic symptoms - Sensation in ear described as sound, especially with movement - Associates ear sensation with fluid or sinus issues - Reduced sense of smell - Frequent nasal congestion - No known allergies  Anxiety - Intermittent use of Lexapro  for anxiety management - Does not take Lexapro  daily but finds it effective when used  Functional status and work readiness - Considering return to work at [employer] involving standing and possible lifting - Feels capable of performing work tasks but  unsure of full capacity until attempting          08/20/2023    9:23 AM 05/22/2023    2:26 PM 05/15/2023    2:25 PM  Depression screen PHQ 2/9  Decreased Interest 1 3 3   Down, Depressed, Hopeless 0 3 3  PHQ - 2 Score 1 6 6   Altered sleeping 3  3  Tired, decreased energy 3  3  Change in appetite 3  3  Feeling bad or failure about yourself  0  3  Trouble concentrating 1  3  Moving slowly or fidgety/restless 3  2  Suicidal thoughts 0  0  PHQ-9 Score 14  23  Difficult doing work/chores Very difficult  Very difficult       08/20/2023    9:23 AM 05/15/2023    2:25 PM 07/20/2020    1:48 PM  GAD 7 : Generalized Anxiety Score  Nervous, Anxious, on Edge 3 3 0  Control/stop worrying 3 3 0  Worry too much - different things 3 3 0  Trouble relaxing 3 3 0  Restless 2 3 0  Easily annoyed or irritable 2 3 0  Afraid - awful might happen 1 3 0  Total GAD 7 Score 17 21 0  Anxiety Difficulty Somewhat difficult Very difficult Not difficult at all    Social History   Tobacco Use   Smoking status: Every Day    Current packs/day: 0.66    Types: Cigarettes   Smokeless tobacco: Never  Substance Use Topics   Alcohol use: Yes    Comment: 2-3 drinks on the weekends    Drug use: Yes  Types: Marijuana    Review of Systems Per HPI unless specifically indicated above     Objective:    BP 108/72 (BP Location: Right Arm, Patient Position: Sitting, Cuff Size: Normal)   Pulse 82   Ht 5' 7 (1.702 m)   Wt 191 lb 6 oz (86.8 kg)   SpO2 97%   BMI 29.97 kg/m   Wt Readings from Last 3 Encounters:  11/20/23 191 lb 6 oz (86.8 kg)  08/20/23 202 lb (91.6 kg)  05/22/23 192 lb (87.1 kg)    Physical Exam Vitals and nursing note reviewed.  Constitutional:      General: He is not in acute distress.    Appearance: Normal appearance. He is well-developed. He is not diaphoretic.     Comments: Well-appearing, comfortable, cooperative  HENT:     Head: Normocephalic and atraumatic.  Eyes:      General:        Right eye: No discharge.        Left eye: No discharge.     Conjunctiva/sclera: Conjunctivae normal.  Cardiovascular:     Rate and Rhythm: Normal rate.  Pulmonary:     Effort: Pulmonary effort is normal.  Skin:    General: Skin is warm and dry.     Findings: No erythema or rash.  Neurological:     Mental Status: He is alert and oriented to person, place, and time.     Sensory: No sensory deficit.     Motor: Weakness (stable 4/5 left upper extermity and lower extremity at baseline) present.  Psychiatric:        Mood and Affect: Mood normal.        Behavior: Behavior normal.        Thought Content: Thought content normal.     Comments: Well groomed, good eye contact, normal speech and thoughts     Results for orders placed or performed during the hospital encounter of 05/03/23  Comprehensive metabolic panel   Collection Time: 05/04/23  6:39 AM  Result Value Ref Range   Sodium 140 135 - 145 mmol/L   Potassium 4.5 3.5 - 5.1 mmol/L   Chloride 106 98 - 111 mmol/L   CO2 25 22 - 32 mmol/L   Glucose, Bld 116 (H) 70 - 99 mg/dL   BUN 14 6 - 20 mg/dL   Creatinine, Ser 9.16 0.61 - 1.24 mg/dL   Calcium 8.6 (L) 8.9 - 10.3 mg/dL   Total Protein 5.6 (L) 6.5 - 8.1 g/dL   Albumin 3.0 (L) 3.5 - 5.0 g/dL   AST 17 15 - 41 U/L   ALT 26 0 - 44 U/L   Alkaline Phosphatase 67 38 - 126 U/L   Total Bilirubin 0.3 0.0 - 1.2 mg/dL   GFR, Estimated >39 >39 mL/min   Anion gap 9 5 - 15  CBC with Differential/Platelet   Collection Time: 05/04/23  6:39 AM  Result Value Ref Range   WBC 7.9 4.0 - 10.5 K/uL   RBC 5.17 4.22 - 5.81 MIL/uL   Hemoglobin 15.9 13.0 - 17.0 g/dL   HCT 53.7 60.9 - 47.9 %   MCV 89.4 80.0 - 100.0 fL   MCH 30.8 26.0 - 34.0 pg   MCHC 34.4 30.0 - 36.0 g/dL   RDW 86.5 88.4 - 84.4 %   Platelets 276 150 - 400 K/uL   nRBC 0.0 0.0 - 0.2 %   Neutrophils Relative % 53 %   Neutro Abs 4.2 1.7 - 7.7 K/uL  Lymphocytes Relative 31 %   Lymphs Abs 2.5 0.7 - 4.0 K/uL    Monocytes Relative 11 %   Monocytes Absolute 0.9 0.1 - 1.0 K/uL   Eosinophils Relative 3 %   Eosinophils Absolute 0.3 0.0 - 0.5 K/uL   Basophils Relative 1 %   Basophils Absolute 0.1 0.0 - 0.1 K/uL   Immature Granulocytes 1 %   Abs Immature Granulocytes 0.04 0.00 - 0.07 K/uL  Basic metabolic panel   Collection Time: 05/06/23  5:17 AM  Result Value Ref Range   Sodium 140 135 - 145 mmol/L   Potassium 4.5 3.5 - 5.1 mmol/L   Chloride 105 98 - 111 mmol/L   CO2 27 22 - 32 mmol/L   Glucose, Bld 109 (H) 70 - 99 mg/dL   BUN 13 6 - 20 mg/dL   Creatinine, Ser 9.25 0.61 - 1.24 mg/dL   Calcium 8.9 8.9 - 89.6 mg/dL   GFR, Estimated >39 >39 mL/min   Anion gap 8 5 - 15  CBC   Collection Time: 05/06/23  5:17 AM  Result Value Ref Range   WBC 7.9 4.0 - 10.5 K/uL   RBC 5.45 4.22 - 5.81 MIL/uL   Hemoglobin 16.6 13.0 - 17.0 g/dL   HCT 50.2 60.9 - 47.9 %   MCV 91.2 80.0 - 100.0 fL   MCH 30.5 26.0 - 34.0 pg   MCHC 33.4 30.0 - 36.0 g/dL   RDW 86.6 88.4 - 84.4 %   Platelets 310 150 - 400 K/uL   nRBC 0.0 0.0 - 0.2 %      Assessment & Plan:   Problem List Items Addressed This Visit     Chronic bilateral low back pain with bilateral sciatica   Generalized anxiety disorder   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 [facility]) - Primary   Relevant Orders   Ambulatory referral to Physical Therapy   History of cerebrovascular accident (CVA) with residual deficit   Other Visit Diagnoses       Flu vaccine need       Relevant Orders   Flu vaccine trivalent PF, 6mos and older(Flulaval,Afluria,Fluarix,Fluzone) (Completed)     Eustachian tube dysfunction, bilateral       Relevant Medications   fluticasone  (FLONASE ) 50 MCG/ACT nasal spray     Postural dizziness           History of CVA with residual deficit Left sided hemiparesis Stroke 04/2023, followed by Neurology Improved but still has Post-stroke residual symptoms (dizziness, impaired coordination, leg  weakness) Seven months post-stroke with symptoms exacerbated by fast movements.  Cleared for return to work without restrictions by Neurology back in Spring 2025 per their documentation. He is now ready to resume work, goal for non physical work if possible. He is already considering one job opportunity  - Provide a letter stating clearance to return to work full duty without restrictions. - Schedule follow-up in six months for routine evaluation.   Refer to [facility] for strength left upper and lower extremity post stroke  Inner ear fluid with associated tinnitus and nasal congestion Fluid sensation and tinnitus possibly related to sinus pressure or allergies. Nasal congestion and impaired smell suggest sinus involvement. - Prescribe Flonase  nasal spray to manage nasal congestion and inner ear fluid symptoms.  Generalized Anxiety Managed with Lexapro , reducing anxiety and worry.  - Continue Lexapro  as prescribed. - Encourage adherence to medication regimen.        Orders Placed This  Encounter  Procedures   Flu vaccine trivalent PF, 6mos and older(Flulaval,Afluria,Fluarix,Fluzone)   Ambulatory referral to Physical Therapy    Referral Priority:   Routine    Referral Type:   Physical Medicine    Referral Reason:   Specialty Services Required    Requested Specialty:   Physical Therapy    Number of Visits Requested:   1    Meds ordered this encounter  Medications   fluticasone  (FLONASE ) 50 MCG/ACT nasal spray    Sig: Place 2 sprays into both nostrils daily. Use for 4-6 weeks then stop and use seasonally or as needed.    Dispense:  16 g    Refill:  1    Follow up plan: Return in about 6 months (around 05/19/2024) for 6 month Annual Physical AM fasting labs after.   Marsa Officer, DO [facility] 11/20/2023, 9:51 AM

## 2024-05-19 ENCOUNTER — Encounter: Admitting: Family Medicine
